# Patient Record
Sex: Female | Born: 1945
Health system: Southern US, Community
[De-identification: ages and names within clinical notes are randomized; demographics above are authoritative.]

## PROBLEM LIST (undated history)

## (undated) DIAGNOSIS — I10 Essential (primary) hypertension: Secondary | ICD-10-CM

## (undated) DIAGNOSIS — E785 Hyperlipidemia, unspecified: Secondary | ICD-10-CM

## (undated) DIAGNOSIS — F329 Major depressive disorder, single episode, unspecified: Secondary | ICD-10-CM

## (undated) DIAGNOSIS — F419 Anxiety disorder, unspecified: Secondary | ICD-10-CM

## (undated) DIAGNOSIS — F32A Depression, unspecified: Secondary | ICD-10-CM

## (undated) HISTORY — PX: WISDOM TOOTH EXTRACTION: SHX21

## (undated) HISTORY — DX: Depression, unspecified: F32.A

## (undated) HISTORY — DX: Essential (primary) hypertension: I10

## (undated) HISTORY — DX: Major depressive disorder, single episode, unspecified: F32.9

## (undated) HISTORY — DX: Anxiety disorder, unspecified: F41.9

## (undated) HISTORY — PX: DENTAL SURGERY: SHX609

## (undated) HISTORY — DX: Hyperlipidemia, unspecified: E78.5

---

## 1997-09-26 ENCOUNTER — Other Ambulatory Visit: Admission: RE | Admit: 1997-09-26 | Discharge: 1997-09-26 | Payer: Self-pay | Admitting: Obstetrics and Gynecology

## 1998-10-08 ENCOUNTER — Other Ambulatory Visit: Admission: RE | Admit: 1998-10-08 | Discharge: 1998-10-08 | Payer: Self-pay | Admitting: Obstetrics and Gynecology

## 1999-12-30 ENCOUNTER — Other Ambulatory Visit: Admission: RE | Admit: 1999-12-30 | Discharge: 1999-12-30 | Payer: Self-pay | Admitting: Obstetrics and Gynecology

## 2001-01-15 ENCOUNTER — Other Ambulatory Visit: Admission: RE | Admit: 2001-01-15 | Discharge: 2001-01-15 | Payer: Self-pay | Admitting: Obstetrics and Gynecology

## 2002-01-18 ENCOUNTER — Other Ambulatory Visit: Admission: RE | Admit: 2002-01-18 | Discharge: 2002-01-18 | Payer: Self-pay | Admitting: Obstetrics and Gynecology

## 2003-01-30 ENCOUNTER — Other Ambulatory Visit: Admission: RE | Admit: 2003-01-30 | Discharge: 2003-01-30 | Payer: Self-pay | Admitting: Obstetrics and Gynecology

## 2004-01-09 ENCOUNTER — Ambulatory Visit: Payer: Self-pay | Admitting: Gastroenterology

## 2004-02-10 ENCOUNTER — Other Ambulatory Visit: Admission: RE | Admit: 2004-02-10 | Discharge: 2004-02-10 | Payer: Self-pay | Admitting: Obstetrics and Gynecology

## 2005-02-11 ENCOUNTER — Other Ambulatory Visit: Admission: RE | Admit: 2005-02-11 | Discharge: 2005-02-11 | Payer: Self-pay | Admitting: Obstetrics and Gynecology

## 2006-03-02 ENCOUNTER — Other Ambulatory Visit: Admission: RE | Admit: 2006-03-02 | Discharge: 2006-03-02 | Payer: Self-pay | Admitting: Obstetrics & Gynecology

## 2007-05-04 ENCOUNTER — Other Ambulatory Visit: Admission: RE | Admit: 2007-05-04 | Discharge: 2007-05-04 | Payer: Self-pay | Admitting: Obstetrics and Gynecology

## 2012-04-27 ENCOUNTER — Ambulatory Visit: Payer: BC Managed Care – PPO

## 2012-04-27 ENCOUNTER — Ambulatory Visit (INDEPENDENT_AMBULATORY_CARE_PROVIDER_SITE_OTHER): Payer: BC Managed Care – PPO | Admitting: Internal Medicine

## 2012-04-27 VITALS — BP 146/79 | HR 72 | Temp 100.0°F | Resp 16 | Ht 64.75 in | Wt 195.2 lb

## 2012-04-27 DIAGNOSIS — R05 Cough: Secondary | ICD-10-CM

## 2012-04-27 DIAGNOSIS — R509 Fever, unspecified: Secondary | ICD-10-CM

## 2012-04-27 DIAGNOSIS — R059 Cough, unspecified: Secondary | ICD-10-CM

## 2012-04-27 DIAGNOSIS — N390 Urinary tract infection, site not specified: Secondary | ICD-10-CM

## 2012-04-27 DIAGNOSIS — E785 Hyperlipidemia, unspecified: Secondary | ICD-10-CM

## 2012-04-27 DIAGNOSIS — I10 Essential (primary) hypertension: Secondary | ICD-10-CM | POA: Insufficient documentation

## 2012-04-27 DIAGNOSIS — R3915 Urgency of urination: Secondary | ICD-10-CM

## 2012-04-27 LAB — POCT URINALYSIS DIPSTICK
Bilirubin, UA: NEGATIVE
Glucose, UA: NEGATIVE
Ketones, UA: NEGATIVE
Nitrite, UA: NEGATIVE
Protein, UA: NEGATIVE
Spec Grav, UA: 1.015
Urobilinogen, UA: 0.2
pH, UA: 7

## 2012-04-27 LAB — POCT CBC
Granulocyte percent: 76.8 %G (ref 37–80)
HCT, POC: 41.8 % (ref 37.7–47.9)
Hemoglobin: 13.4 g/dL (ref 12.2–16.2)
Lymph, poc: 1.3 (ref 0.6–3.4)
MCH, POC: 30.3 pg (ref 27–31.2)
MCHC: 32.1 g/dL (ref 31.8–35.4)
MCV: 93.5 fL (ref 80–97)
MID (cbc): 0.5 (ref 0–0.9)
MPV: 9.9 fL (ref 0–99.8)
POC Granulocyte: 6.1 (ref 2–6.9)
POC LYMPH PERCENT: 16.7 %L (ref 10–50)
POC MID %: 6.5 %M (ref 0–12)
Platelet Count, POC: 208 10*3/uL (ref 142–424)
RBC: 4.47 M/uL (ref 4.04–5.48)
RDW, POC: 12.8 %
WBC: 7.9 10*3/uL (ref 4.6–10.2)

## 2012-04-27 LAB — POCT UA - MICROSCOPIC ONLY
Casts, Ur, LPF, POC: NEGATIVE
Crystals, Ur, HPF, POC: NEGATIVE
Mucus, UA: NEGATIVE
Yeast, UA: NEGATIVE

## 2012-04-27 MED ORDER — HYDROCODONE-HOMATROPINE 5-1.5 MG/5ML PO SYRP: 5.0000 mL | ORAL_SOLUTION | Freq: Four times a day (QID) | ORAL | Status: DC | PRN

## 2012-04-27 MED ORDER — CIPROFLOXACIN HCL 250 MG PO TABS: 250.0000 mg | ORAL_TABLET | Freq: Two times a day (BID) | ORAL | Status: DC

## 2012-04-27 NOTE — Addendum Note (Signed)
Addended by: Tonye Pearson on: 04/27/2012 09:00 AM   Modules accepted: Orders

## 2012-04-27 NOTE — Progress Notes (Signed)
Subjective:    Patient ID: Cindy [name], female    DOB: [birth year], 67 y.o.   MRN: [id number]  HPI complaining of urinary urgency, frequency, and small void for the past 5-6 days Over the past 2 weeks has had congestion with fever and cough and fatigue and myalgias. Seems to get well but relapsed again. History of occasional urinary tract infections without pyelonephritis  Past medical history hypertension and hyperlipidemia/elevated BMI    Review of Systems No chills or night sweats No chest pain No wheezing or shortness of breath No abdominal pain or flank pain    Objective:   Physical Exam Vital signs blood pressure 146/79 temperature 100 Elevated BMI TMs clear/nares clear Throat clear/no nodes Chest with rales at the left base posteriorly Heart regular without murmur Abdomen supple No CVA tenderness =   [facility] reading (PRIMARY) by  Dr.Marjorie Lussier=no active infiltrate Results for orders placed in visit on 04/27/12  POCT UA - MICROSCOPIC ONLY      Result Value Range   WBC, Ur, HPF, POC 4-8     RBC, urine, microscopic 3-8     Bacteria, U Microscopic trace     Mucus, UA neg     Epithelial cells, urine per micros 0-2     Crystals, Ur, HPF, POC neg     Casts, Ur, LPF, POC neg     Yeast, UA neg    POCT URINALYSIS DIPSTICK      Result Value Range   Color, UA yellow     Clarity, UA sl cloudy     Glucose, UA neg     Bilirubin, UA neg     Ketones, UA neg     Spec Grav, UA 1.015     Blood, UA small     pH, UA 7.0     Protein, UA neg     Urobilinogen, UA 0.2     Nitrite, UA neg     Leukocytes, UA moderate (2+)    POCT CBC      Result Value Range   WBC 7.9  4.6 - 10.2 K/uL   Lymph, poc 1.3  0.6 - 3.4   POC LYMPH PERCENT 16.7  10 - 50 %L   MID (cbc) 0.5  0 - 0.9   POC MID % 6.5  0 - 12 %M   POC Granulocyte 6.1  2 - 6.9   Granulocyte percent 76.8  37 - 80 %G   RBC 4.47  4.04 - 5.48 M/uL   Hemoglobin 13.4  12.2 - 16.2 g/dL   HCT, POC 78.2  95.6 - 47.9 %   MCV  93.5  80 - 97 fL   MCH, POC 30.3  27 - 31.2 pg   MCHC 32.1  31.8 - 35.4 g/dL   RDW, POC 21.3     Platelet Count, POC 208  142 - 424 K/uL   MPV 9.9  0 - 99.8 fL        Assessment & Plan:  Urinary urgency - Plan: POCT UA - Microscopic Only, POCT urinalysis dipstick  Cough - Plan: DG Chest 2 View-No infiltrate/probable viral pneumonia now resolving  Fever, unspecified - Plan: POCT CBC, DG Chest 2 View  Urinary tract infection, site not specified - Plan: Urine culture  Meds ordered this encounter  Medications  . ciprofloxacin (CIPRO) 250 MG tablet    Sig: Take 1 tablet (250 mg total) by mouth 2 (two) times daily.    Dispense:  20 tablet  Refill:  0

## 2012-04-29 LAB — URINE CULTURE: Colony Count: >=100000

## 2013-08-01 ENCOUNTER — Ambulatory Visit (INDEPENDENT_AMBULATORY_CARE_PROVIDER_SITE_OTHER): Payer: BC Managed Care – PPO | Admitting: Nurse Practitioner

## 2013-08-01 ENCOUNTER — Encounter: Payer: Self-pay | Admitting: Nurse Practitioner

## 2013-08-01 VITALS — BP 122/76 | HR 64 | Ht 64.5 in | Wt 198.0 lb

## 2013-08-01 DIAGNOSIS — Z0000 Encounter for general adult medical examination without abnormal findings: Secondary | ICD-10-CM

## 2013-08-01 DIAGNOSIS — Z01419 Encounter for gynecological examination (general) (routine) without abnormal findings: Secondary | ICD-10-CM

## 2013-08-01 DIAGNOSIS — Q512 Other doubling of uterus, unspecified: Secondary | ICD-10-CM

## 2013-08-01 DIAGNOSIS — Q5128 Other doubling of uterus, other specified: Secondary | ICD-10-CM

## 2013-08-01 DIAGNOSIS — R350 Frequency of micturition: Secondary | ICD-10-CM

## 2013-08-01 LAB — POCT URINALYSIS DIPSTICK
Bilirubin, UA: NEGATIVE
Blood, UA: NEGATIVE
Glucose, UA: NEGATIVE
Ketones, UA: NEGATIVE
Nitrite, UA: NEGATIVE
Protein, UA: NEGATIVE
Urobilinogen, UA: NEGATIVE
pH, UA: 6

## 2013-08-01 LAB — HEMOGLOBIN, FINGERSTICK: Hemoglobin, fingerstick: 13.1 g/dL (ref 12.0–16.0)

## 2013-08-01 NOTE — Progress Notes (Addendum)
Patient ID: Cindy [name], female   DOB: [birth year], 68 y.o.   MRN: [id number] 69 y.o. G0P0 Married Caucasian Fe here for NGYN annual exam. She is an established patient over 3 years ago.  In the interim her father was ill and then passed.   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 No new diagnosis.  Other health problems of Tinnitus and hearing loss has gotten worse over the years.  She has not been SA in 30 years.  o UTI symptoms.  Patient's last menstrual period was 09/29/2002.          Sexually active: no  The current method of family planning is abstinence and post menopausal status.    Exercising: no  The patient does not participate in regular exercise at present. Smoker:  no  Health Maintenance: Pap:  2011, normal (history of double cervix) MMG:  2015, normal Colonoscopy:  2014, polyps, repeat in 3-5 years  BMD:   2014, normal TDaP:  UTD Labs: HB:  13.1  Urine:  Trace leuk's   reports that she has quit smoking. She has never used smokeless tobacco. She reports that she drinks about 5 ounces of alcohol per week. She reports that she does not use illicit drugs.  Past Medical History  Diagnosis Date  . Depression   . Anxiety   . Hypertension   . Hyperlipidemia     Past Surgical History  Procedure Laterality Date  . Dental surgery  2012 - 2013    implants  . Wisdom tooth extraction  20    Current Outpatient Prescriptions  Medication Sig Dispense Refill  . ALPRAZolam (XANAX) 0.25 MG tablet Take 0.25 mg by mouth as needed.       Marland Kitchen aspirin 81 MG tablet Take 81 mg by mouth daily.      . Calcium Carbonate-Vitamin D (CALCIUM 500 + D PO) Take 2 tablets by mouth daily.      . cholecalciferol (VITAMIN D) 1000 UNITS tablet Take 1,000 Units by mouth daily.      . hydrochlorothiazide (HYDRODIURIL) 25 MG tablet Take 25 mg by mouth daily.      . metoprolol succinate (TOPROL-XL) 100 MG 24 hr tablet Take 100 mg by mouth daily. Take with or immediately following a meal.      .  Multiple Vitamin (MULTIVITAMIN) tablet Take 1 tablet by mouth daily.      . Omega-3 Fatty Acids (FISH OIL) 1000 MG CAPS Take 1 capsule by mouth daily.      Marland Kitchen omeprazole (PRILOSEC) 20 MG capsule Take 20 mg by mouth daily.      . simvastatin (ZOCOR) 10 MG tablet Take 10 mg by mouth at bedtime.      . vitamin E 400 UNIT capsule Take 400 Units by mouth daily.       No current facility-administered medications for this visit.    Family History  Problem Relation Age of Onset  . Dementia Mother   . Cirrhosis Brother     ROS:  Pertinent items are noted in HPI.  Otherwise, a comprehensive ROS was negative.  Exam:   BP 122/76  Pulse 64  Ht 5' 4.5" (1.638 m)  Wt 198 lb (89.812 kg)  BMI 33.47 kg/m2  LMP 09/29/2002 Height: 5' 4.5" (163.8 cm)  Ht Readings from Last 3 Encounters:  08/01/13 5' 4.5" (1.638 m)  04/27/12 5' 4.75" (1.645 m)    General appearance: alert, cooperative and appears stated age Head: Normocephalic, without obvious abnormality, atraumatic  Neck: no adenopathy, supple, symmetrical, trachea midline and thyroid normal to inspection and palpation Lungs: clear to auscultation bilaterally Breasts: normal appearance, no masses or tenderness Heart: regular rate and rhythm Abdomen: soft, non-tender; no masses,  no organomegaly Extremities: extremities normal, atraumatic, no cyanosis or edema Skin: Skin color, texture, turgor normal. No rashes or lesions Lymph nodes: Cervical, supraclavicular, and axillary nodes normal. No abnormal inguinal nodes palpated Neurologic: Grossly normal   Pelvic: External genitalia:  no lesions              Urethra:  normal appearing urethra with no masses, tenderness or lesions              Bartholin's and Skene's: normal                 Vagina: atrophic appearing vagina with normal color and discharge, no lesions              Cervix: anteverted, history of double cervix - bottom right is the largest and atrophic changes, the left is stenotic  and harder to get into cervical os.               Pap taken: yes Bimanual Exam:  Uterus:  normal size, contour, position, consistency, mobility, non-tender              Adnexa: no mass, fullness, tenderness               Rectovaginal: Confirms               Anus:  normal sphincter tone, no lesions  A:  Well Woman with normal exam  Postmenopausal - on HRT from 03/1999 - 03/2007  Hyperlipidemia  Hypertension  R/O UTI  P:   Reviewed health and wellness pertinent to exam  Pap smear taken today X 2  Mammogram due 2016  Follow with urine culture  Counseled on breast self exam, mammography screening, menopause, adequate intake of calcium and vitamin D, diet and exercise, Kegel's exercises return annually or prn  An After Visit Summary was printed and given to the patient.

## 2013-08-01 NOTE — Patient Instructions (Signed)

## 2013-08-02 ENCOUNTER — Encounter: Payer: Self-pay | Admitting: Nurse Practitioner

## 2013-08-02 ENCOUNTER — Other Ambulatory Visit: Payer: Self-pay | Admitting: Nurse Practitioner

## 2013-08-02 LAB — URINALYSIS, MICROSCOPIC ONLY
Casts: NONE SEEN
Crystals: NONE SEEN
Squamous Epithelial / LPF: NONE SEEN

## 2013-08-02 MED ORDER — ESTRADIOL 0.1 MG/GM VA CREA: TOPICAL_CREAM | VAGINAL | Status: DC

## 2013-08-02 NOTE — Progress Notes (Signed)
Encounter reviewed by Dr. Savva Beamer Silva.  

## 2013-08-03 ENCOUNTER — Encounter: Payer: Self-pay | Admitting: Nurse Practitioner

## 2013-08-03 LAB — URINE CULTURE: Colony Count: >=100000

## 2013-08-04 ENCOUNTER — Other Ambulatory Visit: Payer: Self-pay | Admitting: Nurse Practitioner

## 2013-08-04 MED ORDER — NITROFURANTOIN MONOHYD MACRO 100 MG PO CAPS: 100.0000 mg | ORAL_CAPSULE | Freq: Two times a day (BID) | ORAL | Status: DC

## 2013-08-05 LAB — IPS PAP TEST WITH HPV

## 2013-08-29 ENCOUNTER — Encounter: Payer: Self-pay | Admitting: Nurse Practitioner

## 2013-08-29 ENCOUNTER — Ambulatory Visit (INDEPENDENT_AMBULATORY_CARE_PROVIDER_SITE_OTHER): Payer: 59 | Admitting: Nurse Practitioner

## 2013-08-29 VITALS — BP 132/70 | HR 68 | Temp 97.9°F | Wt 200.0 lb

## 2013-08-29 DIAGNOSIS — N390 Urinary tract infection, site not specified: Secondary | ICD-10-CM

## 2013-08-29 LAB — POCT URINALYSIS DIPSTICK
Bilirubin, UA: NEGATIVE
Glucose, UA: NEGATIVE
Ketones, UA: NEGATIVE
Nitrite, UA: NEGATIVE
Protein, UA: NEGATIVE
Urobilinogen, UA: NEGATIVE
pH, UA: 6

## 2013-08-29 MED ORDER — CIPROFLOXACIN HCL 500 MG PO TABS: 500.0000 mg | ORAL_TABLET | Freq: Two times a day (BID) | ORAL | Status: DC

## 2013-08-29 NOTE — Patient Instructions (Signed)

## 2013-08-29 NOTE — Progress Notes (Signed)
Subjective:     Patient ID: Cindy [name], female   DOB: 1945-07-29, 68 y.o.   MRN: [id number]  Urinary Tract Infection  Pertinent negatives include no chills, flank pain, frequency, nausea or vomiting.    This 68 yo WM Fe presents with symptoms of UTI of nocturia for 2 days. This am had an increase in pressure lower abdomen and decrease in flow.  No fever/ chills.  Some diarrhea today.  No other symptoms.  Recently took [place] on 6/4 for E-Coli UTI.  There was no TOC after the med's.  She [place] unsure if she ever got over the last infection.   Review of Systems  Constitutional: Negative for fever, chills and fatigue.  Gastrointestinal: Positive for diarrhea and abdominal distention. Negative for nausea, vomiting, abdominal pain, constipation and anal bleeding.  Genitourinary: Positive for decreased urine volume. Negative for frequency, flank pain and dyspareunia.  Musculoskeletal: Negative for arthralgias, back pain and joint swelling.  Neurological: Negative.   Psychiatric/Behavioral: Negative.        Objective:   Physical Exam  Constitutional: She is oriented to person, place, and time. She appears well-developed and well-nourished. No distress.  Abdominal: Soft. She exhibits no distension. There is no tenderness. There is no rebound and no guarding.  Genitourinary:  No vaginal discharge. Some tender over the bladder. Urine trace RBC, moderate Leuk's  Neurological: She is alert and oriented to person, place, and time.  Psychiatric: She has a normal mood and affect. Her behavior is normal. Judgment and thought content normal.       Assessment:     UTI - recurrent vs. New infection.    Plan:     Change this time to Cipro 500 mg BID and follow urine culture Increase po fluids

## 2013-08-30 LAB — URINALYSIS, MICROSCOPIC ONLY
Bacteria, UA: NONE SEEN
Casts: NONE SEEN
Crystals: NONE SEEN
Squamous Epithelial / LPF: NONE SEEN
WBC, UA: >50 WBC/hpf — AB (ref <3)

## 2013-09-01 LAB — URINE CULTURE: Colony Count: >=100000

## 2013-09-03 NOTE — Progress Notes (Signed)
Encounter reviewed by Dr. Shawn Carattini Silva.  

## 2014-08-05 ENCOUNTER — Ambulatory Visit: Payer: BC Managed Care – PPO | Admitting: Nurse Practitioner

## 2014-08-25 ENCOUNTER — Other Ambulatory Visit: Payer: Self-pay

## 2019-03-29 ENCOUNTER — Ambulatory Visit: Payer: Self-pay

## 2019-04-06 ENCOUNTER — Ambulatory Visit: Payer: Self-pay

## 2019-04-15 ENCOUNTER — Ambulatory Visit: Payer: Self-pay

## 2019-11-26 DIAGNOSIS — R05 Cough: Secondary | ICD-10-CM | POA: Diagnosis not present

## 2019-11-26 DIAGNOSIS — J40 Bronchitis, not specified as acute or chronic: Secondary | ICD-10-CM | POA: Diagnosis not present

## 2019-11-26 DIAGNOSIS — R0609 Other forms of dyspnea: Secondary | ICD-10-CM | POA: Diagnosis not present

## 2019-12-12 ENCOUNTER — Ambulatory Visit
Admission: RE | Admit: 2019-12-12 | Discharge: 2019-12-12 | Disposition: A | Discharge status: Home / Self Care | Payer: Medicare PPO | Source: Ambulatory Visit | Attending: Physician Assistant | Admitting: Physician Assistant

## 2019-12-12 ENCOUNTER — Other Ambulatory Visit: Payer: Self-pay | Admitting: Physician Assistant

## 2019-12-12 DIAGNOSIS — R0602 Shortness of breath: Secondary | ICD-10-CM

## 2019-12-12 DIAGNOSIS — R059 Cough, unspecified: Secondary | ICD-10-CM

## 2019-12-12 DIAGNOSIS — R5383 Other fatigue: Secondary | ICD-10-CM | POA: Diagnosis not present

## 2019-12-16 ENCOUNTER — Other Ambulatory Visit: Payer: Self-pay | Admitting: Family Medicine

## 2019-12-16 DIAGNOSIS — R918 Other nonspecific abnormal finding of lung field: Secondary | ICD-10-CM

## 2019-12-17 DIAGNOSIS — J90 Pleural effusion, not elsewhere classified: Secondary | ICD-10-CM | POA: Diagnosis not present

## 2019-12-17 DIAGNOSIS — R5383 Other fatigue: Secondary | ICD-10-CM | POA: Diagnosis not present

## 2019-12-17 DIAGNOSIS — R0602 Shortness of breath: Secondary | ICD-10-CM | POA: Diagnosis not present

## 2019-12-18 ENCOUNTER — Other Ambulatory Visit (HOSPITAL_COMMUNITY): Payer: Self-pay | Admitting: Family Medicine

## 2019-12-18 ENCOUNTER — Ambulatory Visit (HOSPITAL_BASED_OUTPATIENT_CLINIC_OR_DEPARTMENT_OTHER)
Admission: RE | Admit: 2019-12-18 | Discharge: 2019-12-18 | Disposition: A | Discharge status: Home / Self Care | Payer: Medicare PPO | Source: Ambulatory Visit | Attending: Family Medicine | Admitting: Family Medicine

## 2019-12-18 ENCOUNTER — Encounter (HOSPITAL_BASED_OUTPATIENT_CLINIC_OR_DEPARTMENT_OTHER): Payer: Self-pay

## 2019-12-18 ENCOUNTER — Other Ambulatory Visit: Payer: Self-pay | Admitting: Family Medicine

## 2019-12-18 ENCOUNTER — Other Ambulatory Visit: Payer: Self-pay

## 2019-12-18 DIAGNOSIS — I700 Atherosclerosis of aorta: Secondary | ICD-10-CM | POA: Diagnosis not present

## 2019-12-18 DIAGNOSIS — J90 Pleural effusion, not elsewhere classified: Secondary | ICD-10-CM

## 2019-12-18 DIAGNOSIS — R0600 Dyspnea, unspecified: Secondary | ICD-10-CM | POA: Diagnosis not present

## 2019-12-18 DIAGNOSIS — J9819 Other pulmonary collapse: Secondary | ICD-10-CM | POA: Diagnosis not present

## 2019-12-18 DIAGNOSIS — R918 Other nonspecific abnormal finding of lung field: Secondary | ICD-10-CM | POA: Diagnosis not present

## 2019-12-18 MED ORDER — IOHEXOL 300 MG/ML  SOLN
100.0000 mL | Freq: Once | INTRAMUSCULAR | Status: AC | PRN
  Administered 2019-12-18: 75 mL via INTRAVENOUS

## 2019-12-19 ENCOUNTER — Other Ambulatory Visit (HOSPITAL_COMMUNITY): Payer: Self-pay | Admitting: Radiology

## 2019-12-19 ENCOUNTER — Ambulatory Visit (HOSPITAL_COMMUNITY)
Admission: RE | Admit: 2019-12-19 | Discharge: 2019-12-19 | Disposition: A | Discharge status: Home / Self Care | Payer: Medicare PPO | Source: Ambulatory Visit | Attending: Radiology | Admitting: Radiology

## 2019-12-19 ENCOUNTER — Ambulatory Visit (HOSPITAL_COMMUNITY)
Admission: RE | Admit: 2019-12-19 | Discharge: 2019-12-19 | Disposition: A | Discharge status: Home / Self Care | Payer: Medicare PPO | Source: Ambulatory Visit | Attending: Family Medicine | Admitting: Family Medicine

## 2019-12-19 DIAGNOSIS — I700 Atherosclerosis of aorta: Secondary | ICD-10-CM | POA: Insufficient documentation

## 2019-12-19 DIAGNOSIS — J90 Pleural effusion, not elsewhere classified: Secondary | ICD-10-CM | POA: Diagnosis not present

## 2019-12-19 DIAGNOSIS — Z1152 Encounter for screening for COVID-19: Secondary | ICD-10-CM | POA: Diagnosis not present

## 2019-12-19 DIAGNOSIS — J9811 Atelectasis: Secondary | ICD-10-CM | POA: Diagnosis not present

## 2019-12-19 DIAGNOSIS — R059 Cough, unspecified: Secondary | ICD-10-CM | POA: Diagnosis not present

## 2019-12-19 HISTORY — PX: IR THORACENTESIS ASP PLEURAL SPACE W/IMG GUIDE: IMG5380

## 2019-12-19 LAB — BODY FLUID CELL COUNT WITH DIFFERENTIAL
Eos, Fluid: 1 %
Lymphs, Fluid: 86 %
Monocyte-Macrophage-Serous Fluid: 7 % — ABNORMAL LOW (ref 50–90)
Neutrophil Count, Fluid: 6 % (ref 0–25)
Total Nucleated Cell Count, Fluid: 3140 cu mm — ABNORMAL HIGH (ref 0–1000)

## 2019-12-19 MED ORDER — LIDOCAINE HCL 1 % IJ SOLN
INTRAMUSCULAR | Status: AC
  Filled 2019-12-19: qty 20

## 2019-12-19 MED ORDER — LIDOCAINE HCL 1 % IJ SOLN
INTRAMUSCULAR | Status: DC | PRN
  Administered 2019-12-19: 10 mL

## 2019-12-19 NOTE — Procedures (Signed)
.  PROCEDURE SUMMARY:  Successful US guided left thoracentesis. Yielded 1.5 L of clear yellow fluid. Pt tolerated procedure well. No immediate complications.  Specimen was sent for labs. CXR ordered.  EBL < 5 mL  Ascencion Dike PA-C 12/19/2019 11:36 AM

## 2019-12-20 ENCOUNTER — Other Ambulatory Visit: Payer: Self-pay

## 2019-12-20 ENCOUNTER — Other Ambulatory Visit (HOSPITAL_COMMUNITY)
Admission: RE | Admit: 2019-12-20 | Discharge: 2019-12-20 | Disposition: A | Discharge status: Home / Self Care | Payer: Medicare PPO | Source: Ambulatory Visit

## 2019-12-20 DIAGNOSIS — J948 Other specified pleural conditions: Secondary | ICD-10-CM | POA: Diagnosis not present

## 2019-12-20 LAB — GLUCOSE, PLEURAL OR PERITONEAL FLUID: Glucose, Fluid: 109 mg/dL

## 2019-12-20 LAB — PATHOLOGIST SMEAR REVIEW

## 2019-12-20 LAB — LACTATE DEHYDROGENASE, PLEURAL OR PERITONEAL FLUID: LD, Fluid: 139 U/L — ABNORMAL HIGH (ref 3–23)

## 2019-12-23 LAB — BODY FLUID CULTURE: Culture: NO GROWTH

## 2019-12-24 ENCOUNTER — Ambulatory Visit
Admission: RE | Admit: 2019-12-24 | Discharge: 2019-12-24 | Disposition: A | Discharge status: Home / Self Care | Payer: Medicare PPO | Source: Ambulatory Visit | Attending: Family Medicine | Admitting: Family Medicine

## 2019-12-24 ENCOUNTER — Other Ambulatory Visit: Payer: Self-pay | Admitting: Family Medicine

## 2019-12-24 DIAGNOSIS — J90 Pleural effusion, not elsewhere classified: Secondary | ICD-10-CM

## 2019-12-24 DIAGNOSIS — J9811 Atelectasis: Secondary | ICD-10-CM | POA: Diagnosis not present

## 2019-12-24 LAB — PH, BODY FLUID: pH, Body Fluid: 7.8

## 2019-12-31 ENCOUNTER — Other Ambulatory Visit: Payer: Medicare PPO

## 2020-01-02 ENCOUNTER — Other Ambulatory Visit: Payer: Self-pay

## 2020-01-02 ENCOUNTER — Other Ambulatory Visit (HOSPITAL_COMMUNITY)
Admission: RE | Admit: 2020-01-02 | Discharge: 2020-01-02 | Disposition: A | Discharge status: Home / Self Care | Payer: Medicare PPO | Source: Ambulatory Visit | Attending: Pulmonary Disease | Admitting: Pulmonary Disease

## 2020-01-02 ENCOUNTER — Ambulatory Visit (INDEPENDENT_AMBULATORY_CARE_PROVIDER_SITE_OTHER)
Admission: RE | Admit: 2020-01-02 | Discharge: 2020-01-02 | Disposition: A | Discharge status: Home / Self Care | Payer: Medicare PPO | Source: Ambulatory Visit | Attending: Pulmonary Disease | Admitting: Pulmonary Disease

## 2020-01-02 ENCOUNTER — Ambulatory Visit: Payer: Medicare PPO | Admitting: Pulmonary Disease

## 2020-01-02 ENCOUNTER — Ambulatory Visit (INDEPENDENT_AMBULATORY_CARE_PROVIDER_SITE_OTHER): Payer: Medicare PPO

## 2020-01-02 ENCOUNTER — Encounter: Payer: Self-pay | Admitting: Pulmonary Disease

## 2020-01-02 ENCOUNTER — Telehealth: Payer: Self-pay | Admitting: Pulmonary Disease

## 2020-01-02 VITALS — BP 128/80 | HR 88 | Temp 97.2°F | Ht 64.0 in | Wt 197.5 lb

## 2020-01-02 DIAGNOSIS — C801 Malignant (primary) neoplasm, unspecified: Secondary | ICD-10-CM | POA: Diagnosis not present

## 2020-01-02 DIAGNOSIS — J90 Pleural effusion, not elsewhere classified: Secondary | ICD-10-CM

## 2020-01-02 DIAGNOSIS — J910 Malignant pleural effusion: Secondary | ICD-10-CM | POA: Diagnosis not present

## 2020-01-02 DIAGNOSIS — R222 Localized swelling, mass and lump, trunk: Secondary | ICD-10-CM | POA: Insufficient documentation

## 2020-01-02 DIAGNOSIS — J948 Other specified pleural conditions: Secondary | ICD-10-CM | POA: Diagnosis not present

## 2020-01-02 DIAGNOSIS — R059 Cough, unspecified: Secondary | ICD-10-CM | POA: Diagnosis not present

## 2020-01-02 LAB — BODY FLUID CELL COUNT WITH DIFFERENTIAL
Eos, Fluid: 1 %
Lymphs, Fluid: 91 %
Monocyte-Macrophage-Serous Fluid: 5 % — ABNORMAL LOW (ref 50–90)
Neutrophil Count, Fluid: 3 % (ref 0–25)
Total Nucleated Cell Count, Fluid: 2135 cu mm — ABNORMAL HIGH (ref 0–1000)

## 2020-01-02 LAB — GLUCOSE, PLEURAL OR PERITONEAL FLUID: Glucose, Fluid: 103 mg/dL

## 2020-01-02 LAB — ALBUMIN, PLEURAL OR PERITONEAL FLUID: Albumin, Fluid: 3.2 g/dL

## 2020-01-02 LAB — CYTOLOGY - NON PAP

## 2020-01-02 LAB — LACTATE DEHYDROGENASE, PLEURAL OR PERITONEAL FLUID: LD, Fluid: 149 U/L — ABNORMAL HIGH (ref 3–23)

## 2020-01-02 NOTE — Progress Notes (Signed)
Synopsis: Referred in November 2021 for pleural effusion by [name], MD  Subjective:   PATIENT ID: Cindy [name] GENDER: female DOB: [birth year], MRN: [id number]  Chief Complaint  Patient presents with  . Consult    here for consult for plueral effusion---had 2 liters drained about 2 weeks ago.  she is starting to feel [facility] again and ct showed the fluid is back     This is a 74 year old female past medical history of anxiety, hyperlipidemia, hypertension, former smoker, 15+ years, 1ppd.  Patient referred for evaluation of pleural effusion.  Patient underwent CT scan of the chest on 12/18/2019 which revealed progressive enlargement of a left-sided pleural effusion.  She also had indeterminate pulmonary nodules within the right lung 5 mm in size. History of skin cancers, grandmother with lung cancer   Past Medical History:  Diagnosis Date  . Anxiety   . Depression   . Hyperlipidemia   . Hypertension      Family History  Problem Relation Age of Onset  . Dementia Mother   . COPD Mother   . Lung cancer Maternal Grandmother   . Cirrhosis Brother      Past Surgical History:  Procedure Laterality Date  . DENTAL SURGERY  2012 - 2013   implants  . IR THORACENTESIS ASP PLEURAL SPACE W/IMG GUIDE  12/19/2019  . WISDOM TOOTH EXTRACTION  20    Social History   Socioeconomic History  . Marital status: Widowed    Spouse name: Not on file  . Number of children: 0  . Years of education: Not on file  . Highest education level: Not on file  Occupational History    Employer: [employer]  Tobacco Use  . Smoking status: Former Research scientist (life sciences)  . Smokeless tobacco: Never Used  Substance and Sexual Activity  . Alcohol use: Yes    Alcohol/week: 10.0 standard drinks    Types: 10 drink(s) per week  . Drug use: No  . Sexual activity: Never    Birth control/protection: Abstinence, Post-menopausal  Other Topics Concern  . Not on file  Social History Narrative  . Not on file    Social Determinants of Health   Financial Resource Strain:   . Difficulty of Paying Living Expenses: Not on file  Food Insecurity:   . Worried About Charity fundraiser in the Last Year: Not on file  . Ran Out of Food in the Last Year: Not on file  Transportation Needs:   . Lack of Transportation (Medical): Not on file  . Lack of Transportation (Non-Medical): Not on file  Physical Activity:   . Days of Exercise per Week: Not on file  . Minutes of Exercise per Session: Not on file  Stress:   . Feeling of Stress : Not on file  Social Connections:   . Frequency of Communication with Friends and Family: Not on file  . Frequency of Social Gatherings with Friends and Family: Not on file  . Attends Religious Services: Not on file  . Active Member of Clubs or Organizations: Not on file  . Attends Archivist Meetings: Not on file  . Marital Status: Not on file  Intimate Partner Violence:   . Fear of Current or Ex-Partner: Not on file  . Emotionally Abused: Not on file  . Physically Abused: Not on file  . Sexually Abused: Not on file     No Known Allergies   Outpatient Medications Prior to Visit  Medication Sig Dispense Refill  .  ALPRAZolam (XANAX) 0.25 MG tablet Take 0.25 mg by mouth as needed.     Marland Kitchen aspirin 81 MG tablet Take 81 mg by mouth daily.    . Calcium Carbonate-Vitamin D (CALCIUM 500 + D PO) Take 2 tablets by mouth daily.    . cholecalciferol (VITAMIN D) 1000 UNITS tablet Take 1,000 Units by mouth daily.    . hydrochlorothiazide (HYDRODIURIL) 25 MG tablet Take 25 mg by mouth daily.    . metoprolol succinate (TOPROL-XL) 100 MG 24 hr tablet Take 100 mg by mouth daily. Take with or immediately following a meal.    . Multiple Vitamin (MULTIVITAMIN) tablet Take 1 tablet by mouth daily.    . simvastatin (ZOCOR) 10 MG tablet Take 10 mg by mouth at bedtime.    . vitamin E 400 UNIT capsule Take 400 Units by mouth daily.    . ciprofloxacin (CIPRO) 500 MG tablet Take 1  tablet (500 mg total) by mouth 2 (two) times daily. 14 tablet 0  . Omega-3 Fatty Acids (FISH OIL) 1000 MG CAPS Take 1 capsule by mouth daily.    Marland Kitchen omeprazole (PRILOSEC) 20 MG capsule Take 20 mg by mouth daily.     No facility-administered medications prior to visit.    Review of Systems  Constitutional: Negative for chills, fever, malaise/fatigue and weight loss.  HENT: Negative for hearing loss, sore throat and tinnitus.   Eyes: Negative for blurred vision and double vision.  Respiratory: Positive for shortness of breath. Negative for cough, hemoptysis, sputum production, wheezing and stridor.   Cardiovascular: Negative for chest pain, palpitations, orthopnea, leg swelling and PND.  Gastrointestinal: Negative for abdominal pain, constipation, diarrhea, heartburn, nausea and vomiting.  Genitourinary: Negative for dysuria, hematuria and urgency.  Musculoskeletal: Negative for joint pain and myalgias.  Skin: Negative for itching and rash.  Neurological: Negative for dizziness, tingling, weakness and headaches.  Endo/Heme/Allergies: Negative for environmental allergies. Does not bruise/bleed easily.  Psychiatric/Behavioral: Negative for depression. The patient is not nervous/anxious and does not have insomnia.   All other systems reviewed and are negative.    Objective:  Physical Exam Vitals reviewed.  Constitutional:      General: She is not in acute distress.    Appearance: She is well-developed.  HENT:     Head: Normocephalic and atraumatic.  Eyes:     General: No scleral icterus.    Conjunctiva/sclera: Conjunctivae normal.     Pupils: Pupils are equal, round, and reactive to light.  Neck:     Vascular: No JVD.     Trachea: No tracheal deviation.  Cardiovascular:     Rate and Rhythm: Normal rate and regular rhythm.     Heart sounds: Normal heart sounds. No murmur heard.   Pulmonary:     Effort: Pulmonary effort is normal. No tachypnea, accessory muscle usage or respiratory  distress.     Breath sounds: No stridor. No wheezing, rhonchi or rales.     Comments: Absent breath sounds in the left base Abdominal:     General: Bowel sounds are normal. There is no distension.     Palpations: Abdomen is soft.     Tenderness: There is no abdominal tenderness.  Musculoskeletal:        General: No tenderness.     Cervical back: Neck supple.  Lymphadenopathy:     Cervical: No cervical adenopathy.  Skin:    General: Skin is warm and dry.     Capillary Refill: Capillary refill takes less than 2 seconds.  Findings: No rash.  Neurological:     Mental Status: She is alert and oriented to person, place, and time.  Psychiatric:        Behavior: Behavior normal.      Vitals:   01/02/20 1355  BP: 128/80  Pulse: 88  Temp: (!) 97.2 F (36.2 C)  TempSrc: Tympanic  SpO2: 95%  Weight: 197 lb 8 oz (89.6 kg)  Height: 5\' 4"  (1.626 m)   95% on RA BMI Readings from Last 3 Encounters:  01/02/20 33.90 kg/m  08/29/13 33.80 kg/m  08/01/13 33.46 kg/m   Wt Readings from Last 3 Encounters:  01/02/20 197 lb 8 oz (89.6 kg)  08/29/13 200 lb (90.7 kg)  08/01/13 198 lb (89.8 kg)     CBC    Component Value Date/Time   WBC 7.9 04/27/2012 0841   RBC 4.47 04/27/2012 0841   HGB 13.1 08/01/2013 1047   HCT 41.8 04/27/2012 0841   MCV 93.5 04/27/2012 0841   MCH 30.3 04/27/2012 0841   MCHC 32.1 04/27/2012 0841     Chest Imaging:  Chest x-ray: 01/02/2020: Status post thoracentesis in the office with no evidence of pneumothorax. The patient's images have been independently reviewed by me.    CT chest 01/02/2020: CT chest completed immediately following thoracentesis in the office reveals studded pleural nodularity concerning for malignancy. The patient's images have been independently reviewed by me.    Pulmonary Functions Testing Results: No flowsheet data found.  FeNO:   Pathology:   Pleural fluid from 12/20/2019: CYTOLOGY - NON PAP  CASE: [id number]   PATIENT: Larsen Fury  Non-Gynecological Cytology Report   Clinical History:  Specimen Submitted: A. PLEURAL FLUID, LEFT, THORACENTESIS:   FINAL MICROSCOPIC DIAGNOSIS:  - Suspicious for malignancy  - See comment.   SPECIMEN ADEQUACY:  Satisfactory for evaluation   DIAGNOSTIC COMMENTS:  There are small aggregates of cells with slight nuclear enlargement and  irregularity and some of the cells have cytoplasmic vacuoles.  Immunohistochemistry shows these atypical clusters are positive with  cytokeratin 7, MOC31 and patchy positive with CDX2. They are negative  calretinin, cytokeratin 20, D2-40 TTF-1. The findings are suspicious  for adenocarcinoma and the immunophenotype suggest an upper  gastrointestinal primary needs to be ruled out.   Echocardiogram:   Heart Catheterization:     Assessment & Plan:     ICD-10-CM   1. Pleural effusion  J90 CT Chest Wo Contrast    DG Chest 2 View    Cytology - non gyn    Protein, body fluid (other)    Body fluid culture    Gram stain    Body fluid cell count with differential    Body fluid cell count with differential    Gram stain    Body fluid culture    Protein, body fluid (other)    Cytology - non gyn    NM PET Image Initial (PI) Skull Base To Thigh    Ambulatory referral to Oncology    CANCELED: Albumin, fluid - peritoneal or pleural    CANCELED: Glucose, Body Fluid Other    CANCELED: LD, Body Fluid    CANCELED: Body fluid cell count with differential    CANCELED: LD, Body Fluid    CANCELED: Glucose, Body Fluid Other    CANCELED: Glucose, Body Fluid Other    CANCELED: LD, Body Fluid    CANCELED: Albumin, fluid - peritoneal or pleural  2. Malignant pleural effusion  J91.0 NM PET Image Initial (PI) Skull Base  To Thigh    Ambulatory referral to Oncology  3. Pleural nodule  R22.2 Ambulatory referral to Oncology  4. Adenocarcinoma of unknown primary [facility])  C80.1 Ambulatory referral to Oncology    Discussion:  This is  a 74 year old female past medical history anxiety, hyperlipidemia, hypertension.  She is a former smoker 15+ pack years 1 pack a day.  Quit several years ago.  Initially presented with dyspnea on exertion shortness of breath found to have a left-sided effusion.  She underwent interventional radiology ultrasound-guided thoracentesis.  Pleural fluid analysis revealed lymphocyte predominant effusion.  Epic at the time of the office visit did not have a pathology report pended.  There is only a surgical pathology report this stated that a specimen was collected but no cytology report complete.  We called over to the pathology office to follow-up on the report of this.  After the report finalized at 430 this evening there was positive results for concerning malignant pleural effusion, the ICH staining had several atypical clusters positive with cytokeratin 7, MOC-31, positive CDX2.  These findings were concerning for possible adenocarcinoma of the upper GI location.  Plan: Today in the office due to the patient's progressive shortness of breath and dyspnea decision was made for therapeutic thoracentesis. Please see separate procedure note.  Plan today for tapped pleural effusion dry and sent her directly to the CT scanner for evaluation. Images reviewed with the patient already via phone.  Appears to have pleural studding concerning for pleural-based malignancy.  I have placed a referral to medical oncology and called the patient to let her know this. I have also placed orders for a PET scan to be completed ASAP. Repeat pleural fluid from today's office visit will be sent for cytology evaluation.   Current Outpatient Medications:  .  ALPRAZolam (XANAX) 0.25 MG tablet, Take 0.25 mg by mouth as needed. , Disp: , Rfl:  .  aspirin 81 MG tablet, Take 81 mg by mouth daily., Disp: , Rfl:  .  Calcium Carbonate-Vitamin D (CALCIUM 500 + D PO), Take 2 tablets by mouth daily., Disp: , Rfl:  .  cholecalciferol  (VITAMIN D) 1000 UNITS tablet, Take 1,000 Units by mouth daily., Disp: , Rfl:  .  hydrochlorothiazide (HYDRODIURIL) 25 MG tablet, Take 25 mg by mouth daily., Disp: , Rfl:  .  metoprolol succinate (TOPROL-XL) 100 MG 24 hr tablet, Take 100 mg by mouth daily. Take with or immediately following a meal., Disp: , Rfl:  .  Multiple Vitamin (MULTIVITAMIN) tablet, Take 1 tablet by mouth daily., Disp: , Rfl:  .  simvastatin (ZOCOR) 10 MG tablet, Take 10 mg by mouth at bedtime., Disp: , Rfl:  .  vitamin E 400 UNIT capsule, Take 400 Units by mouth daily., Disp: , Rfl:   I spent 65 minutes dedicated to the care of this patient on the date of this encounter to include pre-visit review of records, face-to-face time with the patient discussing conditions above, post visit ordering of testing, clinical documentation with the electronic health record, making appropriate referrals as documented, and communicating necessary findings to members of the patients care team.  Time spent dedicated this visit separate to procedure documentation.  Garner Nash, DO [facility] 01/02/2020 5:09 PM

## 2020-01-02 NOTE — Patient Instructions (Addendum)
Thank you for visiting Dr. Valeta Harms at [facility]. Today we recommend the following: Orders Placed This Encounter  Procedures  . Body fluid culture  . Gram stain  . CT Chest Wo Contrast  . DG Chest 2 View  . NM PET Image Initial (PI) Skull Base To Thigh  . Protein, body fluid (other)  . Body fluid cell count with differential  . Albumin, pleural or peritoneal fluid  . Glucose, pleural or peritoneal fluid  . Lactate dehydrogenase (pleural or peritoneal fluid)  . Ambulatory referral to Oncology   Return in about 2 weeks (around 01/16/2020).    Please do your part to reduce the spread of COVID-19.

## 2020-01-02 NOTE — Telephone Encounter (Signed)
[facility] and spoke with Truman Hayward in regards to call received for call report. Stated to [place] that pt had cxr performed and that pt is now about to have CT performed. Truman Hayward verbalized understanding and said he would make note that we were aware of the results. Nothing further needed.

## 2020-01-02 NOTE — Progress Notes (Signed)
Thoracentesis  Procedure Note  Tashae Inda  [id number]  1945-06-19  Date:01/02/20  Time:5:27 PM   Provider Performing:Hayslee Casebolt L Vear Staton   Procedure: Thoracentesis with imaging guidance ([id number])  Indication(s) Pleural Effusion  Consent Risks of the procedure as well as the alternatives and risks of each were explained to the patient and/or caregiver.  Consent for the procedure was obtained and is signed in the bedside chart  Anesthesia Topical only with 1% lidocaine    Time Out Verified patient identification, verified procedure, site/side was marked, verified correct patient position, special equipment/implants available, medications/allergies/relevant history reviewed, required imaging and test results available.   Sterile Technique Maximal sterile technique including full sterile barrier drape, hand hygiene, sterile gown, sterile gloves, mask, hair covering, sterile ultrasound probe cover (if used).  Procedure Description Ultrasound was used to identify appropriate pleural anatomy for placement and overlying skin marked. please reference image guidance from [place] below. Area of drainage cleaned and draped in sterile fashion. Lidocaine was used to anesthetize the skin and subcutaneous tissue.  1850 cc's of serosanginous appearing fluid was drained from the left pleural space. Catheter then removed and bandaid applied to site.   Complications/Tolerance None; patient tolerated the procedure well. Chest X-ray is ordered to confirm no post-procedural complication.   EBL Minimal   Specimen(s) Pleural fluid  Please see separate orders  Garner Nash, DO [facility] 01/02/2020 5:27 PM    Left pleural effusion:

## 2020-01-03 LAB — GRAM STAIN
MICRO NUMBER:: 11160848
SPECIMEN QUALITY:: ADEQUATE

## 2020-01-04 LAB — PROTEIN, BODY FLUID (OTHER): Protein, Fluid: 4.8 g/dL

## 2020-01-07 ENCOUNTER — Encounter (HOSPITAL_COMMUNITY)
Admission: RE | Admit: 2020-01-07 | Discharge: 2020-01-07 | Disposition: A | Discharge status: Home / Self Care | Payer: Medicare PPO | Source: Ambulatory Visit | Attending: Pulmonary Disease | Admitting: Pulmonary Disease

## 2020-01-07 ENCOUNTER — Other Ambulatory Visit: Payer: Self-pay

## 2020-01-07 DIAGNOSIS — I517 Cardiomegaly: Secondary | ICD-10-CM | POA: Diagnosis not present

## 2020-01-07 DIAGNOSIS — C801 Malignant (primary) neoplasm, unspecified: Secondary | ICD-10-CM | POA: Diagnosis not present

## 2020-01-07 DIAGNOSIS — J910 Malignant pleural effusion: Secondary | ICD-10-CM | POA: Insufficient documentation

## 2020-01-07 DIAGNOSIS — J90 Pleural effusion, not elsewhere classified: Secondary | ICD-10-CM | POA: Diagnosis not present

## 2020-01-07 DIAGNOSIS — K5730 Diverticulosis of large intestine without perforation or abscess without bleeding: Secondary | ICD-10-CM | POA: Insufficient documentation

## 2020-01-07 DIAGNOSIS — I2510 Atherosclerotic heart disease of native coronary artery without angina pectoris: Secondary | ICD-10-CM | POA: Insufficient documentation

## 2020-01-07 DIAGNOSIS — I700 Atherosclerosis of aorta: Secondary | ICD-10-CM | POA: Insufficient documentation

## 2020-01-07 DIAGNOSIS — C3432 Malignant neoplasm of lower lobe, left bronchus or lung: Secondary | ICD-10-CM | POA: Diagnosis not present

## 2020-01-07 DIAGNOSIS — R222 Localized swelling, mass and lump, trunk: Secondary | ICD-10-CM | POA: Diagnosis not present

## 2020-01-07 LAB — SURGICAL PATHOLOGY

## 2020-01-07 LAB — BODY FLUID CULTURE

## 2020-01-07 LAB — CYTOLOGY - NON PAP

## 2020-01-07 LAB — GLUCOSE, CAPILLARY: Glucose-Capillary: 108 mg/dL — ABNORMAL HIGH (ref 70–99)

## 2020-01-07 MED ORDER — FLUDEOXYGLUCOSE F - 18 (FDG) INJECTION
9.7500 | Freq: Once | INTRAVENOUS | Status: AC | PRN
  Administered 2020-01-07: 9.75 via INTRAVENOUS

## 2020-01-09 ENCOUNTER — Telehealth: Payer: Self-pay | Admitting: *Deleted

## 2020-01-09 DIAGNOSIS — R918 Other nonspecific abnormal finding of lung field: Secondary | ICD-10-CM

## 2020-01-09 NOTE — Telephone Encounter (Signed)
I followed up with Dr. Julien Nordmann regarding Cindy [name]'s next steps.  He would like to see her next week. I called and spoke with patient.  She is schedule to see Dr. Julien Nordmann and rad onc next week. She verbalized understanding of appt time and place.

## 2020-01-16 ENCOUNTER — Inpatient Hospital Stay: Payer: Medicare PPO

## 2020-01-16 ENCOUNTER — Encounter: Payer: Self-pay | Admitting: Internal Medicine

## 2020-01-16 ENCOUNTER — Encounter: Payer: Self-pay | Admitting: Pulmonary Disease

## 2020-01-16 ENCOUNTER — Ambulatory Visit: Payer: Medicare PPO | Admitting: Pulmonary Disease

## 2020-01-16 ENCOUNTER — Other Ambulatory Visit: Payer: Self-pay

## 2020-01-16 ENCOUNTER — Inpatient Hospital Stay: Payer: Medicare PPO | Attending: Internal Medicine | Admitting: Internal Medicine

## 2020-01-16 ENCOUNTER — Ambulatory Visit
Admission: RE | Admit: 2020-01-16 | Discharge: 2020-01-16 | Disposition: A | Discharge status: Home / Self Care | Payer: Medicare PPO | Source: Ambulatory Visit | Attending: Radiation Oncology | Admitting: Radiation Oncology

## 2020-01-16 VITALS — BP 114/66 | HR 71 | Temp 97.9°F | Wt 191.5 lb

## 2020-01-16 DIAGNOSIS — I10 Essential (primary) hypertension: Secondary | ICD-10-CM

## 2020-01-16 DIAGNOSIS — Z801 Family history of malignant neoplasm of trachea, bronchus and lung: Secondary | ICD-10-CM | POA: Diagnosis not present

## 2020-01-16 DIAGNOSIS — Z87891 Personal history of nicotine dependence: Secondary | ICD-10-CM | POA: Diagnosis not present

## 2020-01-16 DIAGNOSIS — Z5111 Encounter for antineoplastic chemotherapy: Secondary | ICD-10-CM

## 2020-01-16 DIAGNOSIS — C3492 Malignant neoplasm of unspecified part of left bronchus or lung: Secondary | ICD-10-CM

## 2020-01-16 DIAGNOSIS — F419 Anxiety disorder, unspecified: Secondary | ICD-10-CM | POA: Diagnosis not present

## 2020-01-16 DIAGNOSIS — R918 Other nonspecific abnormal finding of lung field: Secondary | ICD-10-CM | POA: Insufficient documentation

## 2020-01-16 DIAGNOSIS — E785 Hyperlipidemia, unspecified: Secondary | ICD-10-CM

## 2020-01-16 DIAGNOSIS — J948 Other specified pleural conditions: Secondary | ICD-10-CM | POA: Diagnosis not present

## 2020-01-16 DIAGNOSIS — F329 Major depressive disorder, single episode, unspecified: Secondary | ICD-10-CM | POA: Diagnosis not present

## 2020-01-16 DIAGNOSIS — J910 Malignant pleural effusion: Secondary | ICD-10-CM

## 2020-01-16 DIAGNOSIS — Z7189 Other specified counseling: Secondary | ICD-10-CM

## 2020-01-16 DIAGNOSIS — C3490 Malignant neoplasm of unspecified part of unspecified bronchus or lung: Secondary | ICD-10-CM

## 2020-01-16 LAB — CMP (CANCER CENTER ONLY)
ALT: 17 U/L (ref 0–44)
AST: 21 U/L (ref 15–41)
Albumin: 3.6 g/dL (ref 3.5–5.0)
Alkaline Phosphatase: 63 U/L (ref 38–126)
Anion gap: 8 (ref 5–15)
BUN: 11 mg/dL (ref 8–23)
CO2: 28 mmol/L (ref 22–32)
Calcium: 8.6 mg/dL — ABNORMAL LOW (ref 8.9–10.3)
Chloride: 102 mmol/L (ref 98–111)
Creatinine: 0.76 mg/dL (ref 0.44–1.00)
GFR, Estimated: >60 mL/min (ref >60)
Glucose, Bld: 126 mg/dL — ABNORMAL HIGH (ref 70–99)
Potassium: 3.4 mmol/L — ABNORMAL LOW (ref 3.5–5.1)
Sodium: 138 mmol/L (ref 135–145)
Total Bilirubin: 0.5 mg/dL (ref 0.3–1.2)
Total Protein: 6.7 g/dL (ref 6.5–8.1)

## 2020-01-16 LAB — CBC WITH DIFFERENTIAL (CANCER CENTER ONLY)
Abs Immature Granulocytes: 0.02 10*3/uL (ref 0.00–0.07)
Basophils Absolute: 0 10*3/uL (ref 0.0–0.1)
Basophils Relative: 1 %
Eosinophils Absolute: 0.1 10*3/uL (ref 0.0–0.5)
Eosinophils Relative: 1 %
HCT: 37.5 % (ref 36.0–46.0)
Hemoglobin: 12.5 g/dL (ref 12.0–15.0)
Immature Granulocytes: 0 %
Lymphocytes Relative: 16 %
Lymphs Abs: 1.1 10*3/uL (ref 0.7–4.0)
MCH: 29.5 pg (ref 26.0–34.0)
MCHC: 33.3 g/dL (ref 30.0–36.0)
MCV: 88.4 fL (ref 80.0–100.0)
Monocytes Absolute: 0.6 10*3/uL (ref 0.1–1.0)
Monocytes Relative: 8 %
Neutro Abs: 5.3 10*3/uL (ref 1.7–7.7)
Neutrophils Relative %: 74 %
Platelet Count: 263 10*3/uL (ref 150–400)
RBC: 4.24 MIL/uL (ref 3.87–5.11)
RDW: 12 % (ref 11.5–15.5)
WBC Count: 7.2 10*3/uL (ref 4.0–10.5)
nRBC: 0 % (ref 0.0–0.2)

## 2020-01-16 NOTE — Progress Notes (Signed)
[facility] Telephone:([phone]) [phone]   Fax:([phone]) [phone] Multidisciplinary thoracic oncology clinic  CONSULT NOTE  REFERRING PHYSICIAN: Dr. Leory Plowman Icard  REASON FOR CONSULTATION:  74 years old white female recently diagnosed with lung cancer.  HPI Cindy [name] is a 74 y.o. female with past medical history significant for hypertension, dyslipidemia, and anxiety and depression as well as history of smoking for around 15 years.  The patient was seen by her primary care provider complaining of cough and shortness of breath.  She had chest x-ray on December 12, 2019 and it showed sizable left-sided pleural effusion with consolidation throughout portions of the left lower lobe and lingula.  There was also left upper lobe atelectasis.  This was followed by CT scan of the chest with contrast on December 18, 2019 and it showed progressive enlargement of a left pleural effusion which was large resulting in complete collapse of the left lung.  There was indeterminate pulmonary nodules within the right lung measuring up to 5 mm.  On December 19, 2019 the patient underwent ultrasound-guided left thoracentesis with drainage of 1.5 L of pleural fluid.  The final cytology ([id number]) of the pleural fluid was suspicious for malignancy. Immunohistochemistry shows these atypical clusters are positive with cytokeratin 7, MOC31 and patchy positive with CDX2. They are negative calretinin, cytokeratin 20, D2-40 TTF-1. The findings are suspicious for adenocarcinoma and the immunophenotype suggest an upper gastrointestinal primary needs to be ruled out.  The patient had a PET scan on January 07, 2020 and it showed hypermetabolic 1.4 cm left lower lobe pulmonary nodule with hypermetabolic scattered pleural nodules and small left pleural effusion.  There was a small hypermetabolic left infrahilar lymph node and the appearance is compatible with active malignancy.  There was no findings of spread to  the abdomen/pelvis, neck or bone metastasis. Dr. Valeta Harms kindly referred the patient to me today for evaluation and recommendation regarding treatment of her condition. When seen today the patient continues to have shortness of breath with exertion as well as cough productive of whitish sputum but no significant chest pain or hemoptysis.  She lost few pounds recently.  She denied having any nausea, vomiting, diarrhea or constipation.  She denied having any headache or visual changes.  She has no fever or chills. Past medical history significant for mother with dementia and COPD, maternal grandmother had lung cancer and brother had alcoholic cirrhosis. The patient is a widow and has no children.  She is currently retired and used to work in the [employer].  She has a history for smoking 1 pack/day for around 15 years and quit 37 years ago.  She drinks half a bottle of wine every night.  She has a history of drug abuse in the past but quit in 1985.   HPI  Past Medical History:  Diagnosis Date  . Anxiety   . Depression   . Hyperlipidemia   . Hypertension     Past Surgical History:  Procedure Laterality Date  . DENTAL SURGERY  2012 - 2013   implants  . IR THORACENTESIS ASP PLEURAL SPACE W/IMG GUIDE  12/19/2019  . WISDOM TOOTH EXTRACTION  20    Family History  Problem Relation Age of Onset  . Dementia Mother   . COPD Mother   . Lung cancer Maternal Grandmother   . Cirrhosis Brother     Social History Social History   Tobacco Use  . Smoking status: Former Research scientist (life sciences)  . Smokeless tobacco: Never Used  Substance Use Topics  . Alcohol use: Yes    Alcohol/week: 10.0 standard drinks    Types: 10 drink(s) per week  . Drug use: No    No Known Allergies  Current Outpatient Medications  Medication Sig Dispense Refill  . ALPRAZolam (XANAX) 0.25 MG tablet Take 0.25 mg by mouth as needed.     Marland Kitchen aspirin 81 MG tablet Take 81 mg by mouth daily.    . Calcium Carbonate-Vitamin D (CALCIUM 500  + D PO) Take 2 tablets by mouth daily.    . cholecalciferol (VITAMIN D) 1000 UNITS tablet Take 1,000 Units by mouth daily.    . hydrochlorothiazide (HYDRODIURIL) 25 MG tablet Take 25 mg by mouth daily.    . metoprolol succinate (TOPROL-XL) 100 MG 24 hr tablet Take 100 mg by mouth daily. Take with or immediately following a meal.    . Multiple Vitamin (MULTIVITAMIN) tablet Take 1 tablet by mouth daily.    . simvastatin (ZOCOR) 20 MG tablet     . vitamin E 400 UNIT capsule Take 400 Units by mouth daily.     No current facility-administered medications for this visit.    Review of Systems  Constitutional: positive for fatigue and weight loss Eyes: negative Ears, nose, mouth, throat, and face: negative Respiratory: positive for cough and dyspnea on exertion Cardiovascular: negative Gastrointestinal: negative Genitourinary:negative Integument/breast: negative Hematologic/lymphatic: negative Musculoskeletal:negative Neurological: negative Behavioral/Psych: negative Endocrine: negative Allergic/Immunologic: negative  Physical Exam  DGU:YQIHK, healthy, no distress, well nourished, well developed and anxious SKIN: skin color, texture, turgor are normal, no rashes or significant lesions HEAD: Normocephalic, No masses, lesions, tenderness or abnormalities EYES: normal, PERRLA, Conjunctiva are pink and non-injected EARS: External ears normal, Canals clear OROPHARYNX:no exudate, no erythema and lips, buccal mucosa, and tongue normal  NECK: supple, no adenopathy, no JVD LYMPH:  no palpable lymphadenopathy, no hepatosplenomegaly BREAST:not examined LUNGS: coarse sounds heard, decreased breath sounds HEART: regular rate & rhythm, no murmurs and no gallops ABDOMEN:abdomen soft, non-tender, normal bowel sounds and no masses or organomegaly BACK: No CVA tenderness, Range of motion is normal EXTREMITIES:no joint deformities, effusion, or inflammation, no edema  NEURO: alert & oriented x 3  with fluent speech, no focal motor/sensory deficits  PERFORMANCE STATUS: ECOG 1  LABORATORY DATA: Lab Results  Component Value Date   WBC 7.9 04/27/2012   HGB 13.1 08/01/2013   HCT 41.8 04/27/2012   MCV 93.5 04/27/2012      Chemistry   No results found for: NA, K, CL, CO2, BUN, CREATININE, GLU No results found for: CALCIUM, ALKPHOS, AST, ALT, BILITOT     RADIOGRAPHIC STUDIES: DG Chest 1 View  Result Date: 12/19/2019 CLINICAL DATA:  Status post left-sided thoracentesis EXAM: CHEST  1 VIEW COMPARISON:  Chest CT December 18, 2019; chest radiograph December 12, 2019 FINDINGS: Left pleural effusion is smaller following thoracentesis on the left. There is a moderate partially loculated pleural effusion remaining on the left with compressive atelectasis in the left mid and lower lung zones. A degree of superimposed infiltrate in these areas on the left cannot be excluded. The right lung is clear. Heart size and pulmonary vascular normal. No adenopathy. There is aortic atherosclerosis. No bone lesions. IMPRESSION: Left pleural effusion smaller following thoracentesis. No pneumothorax. Loculated pleural effusion on the left with atelectasis and questionable consolidation in portions of the left mid and lower lung regions. Right lung clear. Stable cardiac silhouette. Aortic Atherosclerosis (ICD10-I70.0). Electronically Signed   By: Lowella Grip III M.D.  On: 12/19/2019 11:39   DG Chest 2 View  Result Date: 01/02/2020 CLINICAL DATA:  Follow-up after thoracentesis. EXAM: CHEST - 2 VIEW COMPARISON:  Chest x-ray 12/24/2019, CT chest 12/18/2019 FINDINGS: The heart size and mediastinal contours are within normal limits. Biapical pleural/pulmonary scarring. Nodular like opacity at the left lateral base. Increased interstitial markings within the left lower lobe and lingula likely due to re-expansion edema of the lungs. No focal consolidation. No pulmonary edema. Interval decrease and almost complete  resolution of a now trace left pleural effusion. No pneumothorax. No acute osseous abnormality. Multilevel degenerative changes of the spine. IMPRESSION: 1. Almost complete interval resolution of a now trace left pleural effusion status post thoracentesis. No pneumothorax. 2. Nodular like density at the left lateral base of unclear etiology. Finding could represent a pulmonary nodule. Recommend CT chest with intravenous contrast for further evaluation. These results will be called to the ordering clinician or representative by the Radiologist Assistant, and communication documented in the PACS or [employer]. Electronically Signed   By: Iven Finn M.D.   On: 01/02/2020 15:38   DG Chest 2 View  Result Date: 12/25/2019 CLINICAL DATA:  Follow-up pleural effusion EXAM: CHEST - 2 VIEW COMPARISON:  12/19/2019 FINDINGS: Moderately large left pleural effusion is slightly larger. Increase in left lower lobe atelectasis. Right lung remains clear.  Negative for heart failure or edema. IMPRESSION: Moderate left effusion shows mild interval enlargement. Progressive left lower lobe atelectasis. Electronically Signed   By: Franchot Gallo M.D.   On: 12/25/2019 15:24   CT Chest Wo Contrast  Result Date: 01/02/2020 CLINICAL DATA:  Follow-up pleural effusion.  Pain and cough. EXAM: CT CHEST WITHOUT CONTRAST TECHNIQUE: Multidetector CT imaging of the chest was performed following the standard protocol without IV contrast. COMPARISON:  Chest CT 12/18/2019 FINDINGS: Cardiovascular: The heart is normal in size for age. No pericardial effusion. Stable mild tortuosity and moderate atherosclerotic calcifications involving the thoracic aorta. Stable scattered three-vessel coronary artery calcifications. Mediastinum/Nodes: No mediastinal or hilar mass or adenopathy. Small scattered lymph nodes are stable. The esophagus is grossly normal. Lungs/Pleura: Interval resolution or evacuation of the left pleural fluid collection.  There is moderate pleural nodularity with some areas of irregular pleural thickening. Could not exclude pleural nodules or pleural neoplasm. Recommend correlation with the pleural fluid that was drained. Patchy ground-glass opacity in the left lung is likely edema and or atelectasis. No discrete lung mass is identified. The right lung is clear. No pleural effusion or pleural nodules. No pulmonary lesions. Upper Abdomen: The upper abdomen is unremarkable. No worrisome hepatic or adrenal gland lesions without contrast. No upper abdominal adenopathy. Scattered vascular calcifications. Musculoskeletal: No breast masses, supraclavicular or axillary adenopathy. The thyroid gland appears normal. The bony thorax is intact.  No worrisome bone lesions. IMPRESSION: 1. Interval resolution or evacuation of the left pleural fluid collection. 2. Moderate diffuse pleural nodularity with some areas of irregular pleural thickening. Findings suspicious for pleural neoplasm. Recommend correlation with the pleural fluid that was drained. 3. Patchy ground-glass opacity in the left lung is likely edema and or atelectasis. No discrete lung mass is identified. 4. No mediastinal or hilar mass or adenopathy. 5. Stable atherosclerotic calcifications involving the thoracic aorta and coronary arteries. Aortic Atherosclerosis (ICD10-I70.0). Electronically Signed   By: Marijo Sanes M.D.   On: 01/02/2020 16:32   CT CHEST W CONTRAST  Result Date: 12/18/2019 CLINICAL DATA:  Dyspnea EXAM: CT CHEST WITH CONTRAST TECHNIQUE: Multidetector CT imaging of the chest was  performed during intravenous contrast administration. CONTRAST:  2mL OMNIPAQUE IOHEXOL 300 MG/ML  SOLN COMPARISON:  None. FINDINGS: Cardiovascular: Mild coronary artery calcification. Global cardiac size within normal limits. No pericardial effusion. Central pulmonary arteries are of normal caliber. Moderate atherosclerotic calcification within the aortic arch and descending thoracic  aorta. The thoracic aorta is of normal caliber. Mediastinum/Nodes: No enlarged mediastinal, hilar, or axillary lymph nodes. Thyroid gland, trachea, and esophagus demonstrate no significant findings. Lungs/Pleura: Large left pleural effusion has enlarged since prior chest radiograph 12/12/2019, and now nearly completely collapse of the left lung. No significant pleural enhancement or pleural nodularity identified. 5 mm noncalcified pulmonary nodule noted with right upper lobe, axial image # 69, indeterminate. 4 mm subpleural pulmonary nodule, right apex, axial image # 19, indeterminate. Right lung is otherwise clear. No pneumothorax. No pleural effusion on the right. The central airways are widely patent. Upper Abdomen: Moderate hepatic steatosis. Musculoskeletal: No lytic or blastic bone lesions. No acute bone abnormality. IMPRESSION: Progressive enlargement of a left pleural effusion, now large in resulting in near complete collapse of the left lung. Diagnostic and therapeutic thoracentesis may be helpful for further management. Indeterminate pulmonary nodules within the right lung measuring up to 5 mm. No follow-up needed if patient is low-risk (and has no known or suspected primary neoplasm). Non-contrast chest CT can be considered in 12 months if patient is high-risk. This recommendation follows the consensus statement: Guidelines for Management of Incidental Pulmonary Nodules Detected on CT Images: From the Fleischner Society 2017; Radiology 2017; 284:228-243. Moderate hepatic steatosis. Aortic Atherosclerosis (ICD10-I70.0). Electronically Signed   By: Fidela Salisbury MD   On: 12/18/2019 11:12   NM PET Image Initial (PI) Skull Base To Thigh  Result Date: 01/07/2020 CLINICAL DATA:  Initial treatment strategy for pulmonary nodule and pleural effusion. EXAM: NUCLEAR MEDICINE PET SKULL BASE TO THIGH TECHNIQUE: 9.8 mCi F-18 FDG was injected intravenously. Full-ring PET imaging was performed from the skull base to  thigh after the radiotracer. CT data was obtained and used for attenuation correction and anatomic localization. Fasting blood glucose: 108 mg/dl COMPARISON:  CT chest 01/02/2020 FINDINGS: Mediastinal blood pool activity: SUV max 3.0 Liver activity: SUV max NA NECK: Symmetric glottic activity, maximum SUV 7.3, likely physiologic. No specific CT abnormality in this vicinity. Incidental CT findings: Bilateral common carotid atherosclerotic calcification. CHEST: Hypermetabolic solid pleural nodules along the small left pleural effusion compatible with pleural malignancy. Along the left hemidiaphragm a nodule measuring about 1.8 by 1.3 cm has a maximum SUV of 8.6. Along the left posterior costophrenic angle, a hypermetabolic nodule measuring about 3.0 by 1.1 cm on image 108 of series 4 has a maximum SUV of 6.7. Multiple additional foci of pleural metabolic tumor identified. Moreover, in the superior segment left lower lobe there is some parenchymal nodularity there is hypermetabolic, including a 1.3 by 1.4 cm nodule on image 27 of series 8 with maximum SUV 9.6. A left infrahilar lymph node measuring 0.7 cm in short axis on image 73 of series 4 has a maximum SUV of 5.9. Incidental CT findings: Coronary, aortic arch, and branch vessel atherosclerotic vascular disease. Mild cardiomegaly. ABDOMEN/PELVIS: No significant abnormal hypermetabolic activity in this region. 1.0 cm left inguinal lymph node has a maximum SUV of 3.2, roughly similar to blood pool and probably incidental. Incidental CT findings: Aortoiliac atherosclerotic vascular disease. Sigmoid colon diverticulosis. SKELETON: No significant abnormal hypermetabolic activity in this region. Incidental CT findings: none IMPRESSION: 1. Hypermetabolic 1.4 cm left lower lobe pulmonary nodule, with  hypermetabolic scattered pleural nodules and small left pleural effusion. Small hypermetabolic left infrahilar lymph node. Appearance compatible with active malignancy. No  findings of spread to the abdomen/pelvis, neck, or skeleton. 2. Other imaging findings of potential clinical significance: Aortic Atherosclerosis (ICD10-I70.0). Coronary atherosclerosis. Mild cardiomegaly. Sigmoid colon diverticulosis. Electronically Signed   By: Van Clines M.D.   On: 01/07/2020 12:12   IR THORACENTESIS ASP PLEURAL SPACE W/IMG GUIDE  Result Date: 12/19/2019 INDICATION: Shortness of breath. Large left pleural effusion. Request for diagnostic and therapeutic thoracentesis. EXAM: ULTRASOUND GUIDED LEFT THORACENTESIS MEDICATIONS: 1% PLAIN LIDOCAINE, 1 ML COMPLICATIONS: None immediate. PROCEDURE: An ultrasound guided thoracentesis was thoroughly discussed with the patient and questions answered. The benefits, risks, alternatives and complications were also discussed. The patient understands and wishes to proceed with the procedure. Written consent was obtained. Ultrasound was performed to localize and mark an adequate pocket of fluid in the LEFT chest. The area was then prepped and draped in the normal sterile fashion. 1% Lidocaine was used for local anesthesia. Under ultrasound guidance a 6 Fr Safe-T-Centesis catheter was introduced. Thoracentesis was performed. The catheter was removed and a dressing applied. FINDINGS: A total of approximately 1.5 L of clear yellow fluid was removed. Samples were sent to the laboratory as requested by the clinical team. IMPRESSION: Successful ultrasound guided left thoracentesis yielding 1.5 L of pleural fluid. Read by: Ascencion Dike PA-C Electronically Signed   By: Corrie Mckusick D.O.   On: 12/19/2019 11:42    ASSESSMENT: This is a very pleasant 74 years old white female with stage IV (T1b, N1, M1 a) non-small cell lung cancer favoring adenocarcinoma diagnosed in October 2021.  The pathologic immunohistochemical stains suggestive of upper GI primary but there is no finding on the PET scan or imaging study to support this possibility.   PLAN: I had a  lengthy discussion with the patient today about her current disease stage, prognosis and treatment options. I personally and independently reviewed the PET scan images and discussed the result and showed the images to the patient today. I recommended for her to complete the staging work-up by ordering MRI of the brain to rule out brain metastasis. The patient was seen earlier today by Dr. Valeta Harms and he referred her to IR for consideration of CT-guided core biopsy of one of the left lung mass for more confirmation of her tissue diagnosis as well as having material for molecular studies if needed. In the meantime I will send blood test to [facility] for molecular studies. I will arrange for the patient to come back for follow-up visit in around 2 weeks for evaluation and discussion of her treatment options based on molecular studies and will complete the staging work-up. If she had recurrent left pleural effusion, I will arrange for the patient to have a Pleurx catheter placed for drainage. The patient was advised to call immediately if she has any concerning symptoms in the interval. The patient voices understanding of current disease status and treatment options and is in agreement with the current care plan.  All questions were answered. The patient knows to call the clinic with any problems, questions or concerns. We can certainly see the patient much sooner if necessary.  Thank you so much for allowing me to participate in the care of Cindy [name]. I will continue to follow up the patient with you and assist in her care.  The total time spent in the appointment was 90 minutes.  Disclaimer: This note was dictated with  voice recognition software. Similar sounding words can inadvertently be transcribed and may not be corrected upon review.   Eilleen Kempf January 16, 2020, 2:53 PM

## 2020-01-16 NOTE — Progress Notes (Signed)
Synopsis: Referred in November 2021 for pleural effusion by [name], MD  Subjective:   PATIENT ID: Cindy [name] GENDER: female DOB: January 13, 1946, MRN: [id number]  Chief Complaint  Patient presents with  . Follow-up    PET scan done on 11/9 --pt has appt with oncology today    This is a 74 year old female past medical history of anxiety, hyperlipidemia, hypertension, former smoker, 15+ years, 1ppd.  Patient referred for evaluation of pleural effusion.  Patient underwent CT scan of the chest on 12/18/2019 which revealed progressive enlargement of a left-sided pleural effusion.  She also had indeterminate pulmonary nodules within the right lung 5 mm in size. History of skin cancers, grandmother with lung cancer.  OV 01/16/2020: Patient here today for follow-up after recent nuclear medicine PET scan.  Pleural effusion tapped again recently cytology also consistent with malignant effusion.  We reviewed the patient's case this morning in the medical thoracic oncology conference.  Case was discussed needing additional tissue for molecular cytogenetics.  We discussed today in the office bronchoscopy versus CT-guided biopsy.  I think last resort would be thoracoscopy with pleural biopsy if needed.   Past Medical History:  Diagnosis Date  . Anxiety   . Depression   . Hyperlipidemia   . Hypertension      Family History  Problem Relation Age of Onset  . Dementia Mother   . COPD Mother   . Lung cancer Maternal Grandmother   . Cirrhosis Brother      Past Surgical History:  Procedure Laterality Date  . DENTAL SURGERY  2012 - 2013   implants  . IR THORACENTESIS ASP PLEURAL SPACE W/IMG GUIDE  12/19/2019  . WISDOM TOOTH EXTRACTION  20    Social History   Socioeconomic History  . Marital status: Widowed    Spouse name: Not on file  . Number of children: 0  . Years of education: Not on file  . Highest education level: Not on file  Occupational History    Employer: [employer]  [place]  Tobacco Use  . Smoking status: Former Research scientist (life sciences)  . Smokeless tobacco: Never Used  Substance and Sexual Activity  . Alcohol use: Yes    Alcohol/week: 10.0 standard drinks    Types: 10 drink(s) per week  . Drug use: No  . Sexual activity: Never    Birth control/protection: Abstinence, Post-menopausal  Other Topics Concern  . Not on file  Social History Narrative  . Not on file   Social Determinants of Health   Financial Resource Strain:   . Difficulty of Paying Living Expenses: Not on file  Food Insecurity:   . Worried About Charity fundraiser in the Last Year: Not on file  . Ran Out of Food in the Last Year: Not on file  Transportation Needs:   . Lack of Transportation (Medical): Not on file  . Lack of Transportation (Non-Medical): Not on file  Physical Activity:   . Days of Exercise per Week: Not on file  . Minutes of Exercise per Session: Not on file  Stress:   . Feeling of Stress : Not on file  Social Connections:   . Frequency of Communication with Friends and Family: Not on file  . Frequency of Social Gatherings with Friends and Family: Not on file  . Attends Religious Services: Not on file  . Active Member of Clubs or Organizations: Not on file  . Attends Archivist Meetings: Not on file  . Marital Status: Not on file  Intimate Partner Violence:   . Fear of Current or Ex-Partner: Not on file  . Emotionally Abused: Not on file  . Physically Abused: Not on file  . Sexually Abused: Not on file     No Known Allergies   Outpatient Medications Prior to Visit  Medication Sig Dispense Refill  . ALPRAZolam (XANAX) 0.25 MG tablet Take 0.25 mg by mouth as needed.     Marland Kitchen aspirin 81 MG tablet Take 81 mg by mouth daily.    . Calcium Carbonate-Vitamin D (CALCIUM 500 + D PO) Take 2 tablets by mouth daily.    . cholecalciferol (VITAMIN D) 1000 UNITS tablet Take 1,000 Units by mouth daily.    . hydrochlorothiazide (HYDRODIURIL) 25 MG tablet Take 25 mg by  mouth daily.    . metoprolol succinate (TOPROL-XL) 100 MG 24 hr tablet Take 100 mg by mouth daily. Take with or immediately following a meal.    . Multiple Vitamin (MULTIVITAMIN) tablet Take 1 tablet by mouth daily.    . simvastatin (ZOCOR) 20 MG tablet     . vitamin E 400 UNIT capsule Take 400 Units by mouth daily.    . simvastatin (ZOCOR) 10 MG tablet Take 10 mg by mouth at bedtime. (Patient not taking: Reported on 01/16/2020)     No facility-administered medications prior to visit.    Review of Systems  Constitutional: Negative for chills, fever, malaise/fatigue and weight loss.  HENT: Negative for hearing loss, sore throat and tinnitus.   Eyes: Negative for blurred vision and double vision.  Respiratory: Positive for shortness of breath. Negative for cough, hemoptysis, sputum production, wheezing and stridor.   Cardiovascular: Negative for chest pain, palpitations, orthopnea, leg swelling and PND.  Gastrointestinal: Negative for abdominal pain, constipation, diarrhea, heartburn, nausea and vomiting.  Genitourinary: Negative for dysuria, hematuria and urgency.  Musculoskeletal: Negative for joint pain and myalgias.  Skin: Negative for itching and rash.  Neurological: Negative for dizziness, tingling, weakness and headaches.  Endo/Heme/Allergies: Negative for environmental allergies. Does not bruise/bleed easily.  Psychiatric/Behavioral: Negative for depression. The patient is not nervous/anxious and does not have insomnia.   All other systems reviewed and are negative.   Objective:  Physical Exam Vitals reviewed.  Constitutional:      General: She is not in acute distress.    Appearance: She is well-developed.  HENT:     Head: Normocephalic and atraumatic.  Eyes:     General: No scleral icterus.    Conjunctiva/sclera: Conjunctivae normal.     Pupils: Pupils are equal, round, and reactive to light.  Neck:     Vascular: No JVD.     Trachea: No tracheal deviation.   Cardiovascular:     Rate and Rhythm: Normal rate and regular rhythm.     Heart sounds: Normal heart sounds. No murmur heard.   Pulmonary:     Effort: Pulmonary effort is normal. No tachypnea, accessory muscle usage or respiratory distress.     Breath sounds: No stridor. No wheezing, rhonchi or rales.     Comments: diminshed breath sounds in left base  Abdominal:     General: Bowel sounds are normal. There is no distension.     Palpations: Abdomen is soft.     Tenderness: There is no abdominal tenderness.  Musculoskeletal:        General: No tenderness.     Cervical back: Neck supple.  Lymphadenopathy:     Cervical: No cervical adenopathy.  Skin:    General: Skin is warm  and dry.     Capillary Refill: Capillary refill takes less than 2 seconds.     Findings: No rash.  Neurological:     Mental Status: She is alert and oriented to person, place, and time.  Psychiatric:        Behavior: Behavior normal.     Vitals:   01/16/20 1146  BP: 114/66  Pulse: 71  Temp: 97.9 F (36.6 C)  TempSrc: Tympanic  SpO2: 96%  Weight: 191 lb 8 oz (86.9 kg)   96% on RA BMI Readings from Last 3 Encounters:  01/16/20 32.87 kg/m  01/02/20 33.90 kg/m  08/29/13 33.80 kg/m   Wt Readings from Last 3 Encounters:  01/16/20 191 lb 8 oz (86.9 kg)  01/02/20 197 lb 8 oz (89.6 kg)  08/29/13 200 lb (90.7 kg)    CBC    Component Value Date/Time   WBC 7.9 04/27/2012 0841   RBC 4.47 04/27/2012 0841   HGB 13.1 08/01/2013 1047   HCT 41.8 04/27/2012 0841   MCV 93.5 04/27/2012 0841   MCH 30.3 04/27/2012 0841   MCHC 32.1 04/27/2012 0841     Chest Imaging:  Chest x-ray: 01/02/2020: Status post thoracentesis in the office with no evidence of pneumothorax. The patient's images have been independently reviewed by me.    CT chest 01/02/2020: CT chest completed immediately following thoracentesis in the office reveals studded pleural nodularity concerning for malignancy. The patient's images have  been independently reviewed by me.    Pulmonary Functions Testing Results: No flowsheet data found.  FeNO:   Pathology:   Pleural fluid from 12/20/2019: CYTOLOGY - NON PAP  CASE: [id number]  PATIENT: Heleena Loudon  Non-Gynecological Cytology Report   Clinical History:  Specimen Submitted: A. PLEURAL FLUID, LEFT, THORACENTESIS:   FINAL MICROSCOPIC DIAGNOSIS:  - Suspicious for malignancy  - See comment.   SPECIMEN ADEQUACY:  Satisfactory for evaluation   DIAGNOSTIC COMMENTS:  There are small aggregates of cells with slight nuclear enlargement and  irregularity and some of the cells have cytoplasmic vacuoles.  Immunohistochemistry shows these atypical clusters are positive with  cytokeratin 7, MOC31 and patchy positive with CDX2. They are negative  calretinin, cytokeratin 20, D2-40 TTF-1. The findings are suspicious  for adenocarcinoma and the immunophenotype suggest an upper  gastrointestinal primary needs to be ruled out.   Echocardiogram:   Heart Catheterization:      Assessment & Plan:     ICD-10-CM   1. Malignant pleural effusion  J91.0 CT GUIDED NEEDLE PLACEMENT  2. Pleural mass  J94.8 CT GUIDED NEEDLE PLACEMENT  3. Former smoker  Z87.891    Discussion:  This is a 74 year old female past medical history of anxiety, hypertension hyperlipidemia, former smoker, 15-pack-year history.  Patient found to have a new left-sided pleural effusion underwent ultrasound-guided thoracentesis which revealed lymphocyte predominant effusion and malignant cells.  The IHC staining of these malignant atypical clusters included positive for cytokeratin 7, mock 31, positive CDX2 which was possible upper GI location.  The PET scan was completed this week which revealed a hypermetabolic lower lobe nodule as well as associated studding pleural disease.  This is concerning for stage IV M1 a adenocarcinoma.  We discussed need for additional tissue for molecular cytogenetics and  medical thoracic oncology conference this morning.  They can expect steps for this would be including a CT-guided biopsy of the pleural disease this would allow additional tissue sampling without her having undergo general anesthesia.  Second set next best step would be  a bronchoscopy for tissue sampling of the hilum and lung mass if needed.  If we were unable to obtain enough tissue for either of these neck steps a thoracoscopy could be completed by thoracic surgery for pleural biopsy.  Would like to prefer to avoid this unless needed.  We discussed each of these steps today in the office and the patient is agreeable to proceed with referral to CT-guided biopsy.  She has a follow-up appointment and establish care with Dr. Earlie Server at the cancer center this afternoon.   Current Outpatient Medications:  .  ALPRAZolam (XANAX) 0.25 MG tablet, Take 0.25 mg by mouth as needed. , Disp: , Rfl:  .  aspirin 81 MG tablet, Take 81 mg by mouth daily., Disp: , Rfl:  .  Calcium Carbonate-Vitamin D (CALCIUM 500 + D PO), Take 2 tablets by mouth daily., Disp: , Rfl:  .  cholecalciferol (VITAMIN D) 1000 UNITS tablet, Take 1,000 Units by mouth daily., Disp: , Rfl:  .  hydrochlorothiazide (HYDRODIURIL) 25 MG tablet, Take 25 mg by mouth daily., Disp: , Rfl:  .  metoprolol succinate (TOPROL-XL) 100 MG 24 hr tablet, Take 100 mg by mouth daily. Take with or immediately following a meal., Disp: , Rfl:  .  Multiple Vitamin (MULTIVITAMIN) tablet, Take 1 tablet by mouth daily., Disp: , Rfl:  .  simvastatin (ZOCOR) 20 MG tablet, , Disp: , Rfl:  .  vitamin E 400 UNIT capsule, Take 400 Units by mouth daily., Disp: , Rfl:   I spent 42 minutes dedicated to the care of this patient on the date of this encounter to include pre-visit review of records, face-to-face time with the patient discussing conditions above, post visit ordering of testing, clinical documentation with the electronic health record, making appropriate referrals  as documented, and communicating necessary findings to members of the patients care team.    Garner Nash, DO [facility] 01/16/2020 12:01 PM

## 2020-01-16 NOTE — Patient Instructions (Signed)
Thank you for visiting Dr. Valeta Harms at [facility]. Today we recommend the following:  Orders Placed This Encounter  Procedures  . CT GUIDED NEEDLE PLACEMENT   Call me! If you need anything.   Return in about 3 months (around 04/17/2020). with Dr. Valeta Harms     Please do your part to reduce the spread of COVID-19.

## 2020-01-17 ENCOUNTER — Encounter (HOSPITAL_COMMUNITY): Payer: Self-pay

## 2020-01-17 DIAGNOSIS — C3492 Malignant neoplasm of unspecified part of left bronchus or lung: Secondary | ICD-10-CM | POA: Insufficient documentation

## 2020-01-17 DIAGNOSIS — Z7189 Other specified counseling: Secondary | ICD-10-CM | POA: Insufficient documentation

## 2020-01-17 DIAGNOSIS — Z5111 Encounter for antineoplastic chemotherapy: Secondary | ICD-10-CM | POA: Insufficient documentation

## 2020-01-17 NOTE — Progress Notes (Unsigned)
Cindy [name] Female, 74 y.o., 1945/12/13 MRN:  [id number] Phone:  [phone] [name]) PCP:  [name], MD Coverage:  [facility] Medicare/[employer] Medicare Choice Ppo Next Appt With Radiology ([facility]-MR 1) 01/22/2020 at 8:00 PM  RE: Biopsy Received: Today Message Details  Suttle, Rosanne Ashing, MD  [name] Approved for CT guided left posterior pleural mass biopsy. Best location in my opinion is series 4, image 79 on CT portion of recent PET/CT (01/07/20).    [name]   Previous Messages  ----- Message -----  From: [name]  Sent: 01/17/2020 11:22 AM EST  To: Ir Procedure Requests  Subject: Biopsy                      Procedure Requested: CT Biopsy    Reason for Procedure: pleural biopsy, PET avid lesion    Provider Requesting: June Leap  Provider Telephone: [phone]    Other Info:

## 2020-01-20 ENCOUNTER — Telehealth: Payer: Self-pay | Admitting: Medical Oncology

## 2020-01-20 ENCOUNTER — Encounter: Payer: Self-pay | Admitting: *Deleted

## 2020-01-20 NOTE — Progress Notes (Signed)
I contacted Dr. Valeta Harms regarding bx, wait for response

## 2020-01-20 NOTE — Telephone Encounter (Signed)
CT biopsy- Should she wait on CT biopsy until other tests resulted?  COVID vaccine and Mammogram.Should she get COVID booster and her annual mammogram? I told pt to get her mammogram  First and then her COVID booster.

## 2020-01-21 DIAGNOSIS — Z1231 Encounter for screening mammogram for malignant neoplasm of breast: Secondary | ICD-10-CM | POA: Diagnosis not present

## 2020-01-22 ENCOUNTER — Ambulatory Visit (HOSPITAL_COMMUNITY)
Admission: RE | Admit: 2020-01-22 | Discharge: 2020-01-22 | Disposition: A | Discharge status: Home / Self Care | Payer: Medicare PPO | Source: Ambulatory Visit | Attending: Internal Medicine | Admitting: Internal Medicine

## 2020-01-22 DIAGNOSIS — G9389 Other specified disorders of brain: Secondary | ICD-10-CM | POA: Diagnosis not present

## 2020-01-22 DIAGNOSIS — C3490 Malignant neoplasm of unspecified part of unspecified bronchus or lung: Secondary | ICD-10-CM | POA: Insufficient documentation

## 2020-01-22 MED ORDER — GADOBUTROL 1 MMOL/ML IV SOLN
9.0000 mL | Freq: Once | INTRAVENOUS | Status: AC | PRN
  Administered 2020-01-22: 9 mL via INTRAVENOUS

## 2020-01-28 ENCOUNTER — Encounter: Payer: Self-pay | Admitting: Internal Medicine

## 2020-01-29 LAB — GUARDANT 360

## 2020-01-30 ENCOUNTER — Other Ambulatory Visit: Payer: Self-pay

## 2020-01-30 ENCOUNTER — Inpatient Hospital Stay: Payer: Medicare PPO | Attending: Internal Medicine | Admitting: Internal Medicine

## 2020-01-30 VITALS — BP 155/76 | HR 70 | Temp 98.6°F | Resp 18 | Ht 64.0 in | Wt 190.9 lb

## 2020-01-30 DIAGNOSIS — Z7189 Other specified counseling: Secondary | ICD-10-CM

## 2020-01-30 DIAGNOSIS — C3492 Malignant neoplasm of unspecified part of left bronchus or lung: Secondary | ICD-10-CM | POA: Diagnosis not present

## 2020-01-30 DIAGNOSIS — Z5111 Encounter for antineoplastic chemotherapy: Secondary | ICD-10-CM

## 2020-01-30 DIAGNOSIS — C3491 Malignant neoplasm of unspecified part of right bronchus or lung: Secondary | ICD-10-CM | POA: Insufficient documentation

## 2020-01-30 NOTE — Progress Notes (Signed)
[place] [phone]   [phone]  OFFICE PROGRESS NOTE  [name], MD [place] [address] [place] [address]  DIAGNOSIS: stage IV (T1b, N1, M1 a) non-small cell lung cancer favoring adenocarcinoma diagnosed in October 2021.  The pathologic immunohistochemical stains suggestive of upper GI primary but there is no finding on the PET scan or imaging study to support this possibility.  DETECTED ALTERATION(S) / BIOMARKER(S) % CFDNA OR AMPLIFICATION ASSOCIATED FDA-APPROVED THERAPIES CLINICAL TRIAL AVAILABILITY [id number] 0.4% None   PRIOR THERAPY: None  CURRENT THERAPY: None  INTERVAL HISTORY: Cindy [name] 74 y.o. female returns to the clinic today for follow-up visit.  2 of her friends were available by FaceTime during the visit.  The patient is feeling fine today with no concerning complaints except for mild shortness of breath with exertion.  The patient denied having any chest pain, cough or hemoptysis.  She denied having any fever or chills.  She has no nausea, vomiting, diarrhea or constipation.  She denied having any headache or visual changes.  She has no recent weight loss or night sweats.  She had several studies performed since her last visit including MRI of the brain as well as molecular studies by [employer].  The patient is here today for evaluation and discussion of her treatment options based on the recent imaging and blood work.  MEDICAL HISTORY: Past Medical History:  Diagnosis Date  . Anxiety   . Depression   . Hyperlipidemia   . Hypertension     ALLERGIES:  has No Known Allergies.  MEDICATIONS:  Current Outpatient Medications  Medication Sig Dispense Refill  . ALPRAZolam (XANAX) 0.25 MG tablet Take 0.25 mg by mouth as needed.     Marland Kitchen aspirin 81 MG tablet Take 81 mg by mouth daily.    . Calcium Carbonate-Vitamin D (CALCIUM 500 + D PO) Take 2 tablets by mouth daily.    . cholecalciferol (VITAMIN D)  1000 UNITS tablet Take 1,000 Units by mouth daily.    . hydrochlorothiazide (HYDRODIURIL) 25 MG tablet Take 25 mg by mouth daily.    . metoprolol succinate (TOPROL-XL) 100 MG 24 hr tablet Take 100 mg by mouth daily. Take with or immediately following a meal.    . Multiple Vitamin (MULTIVITAMIN) tablet Take 1 tablet by mouth daily.    . simvastatin (ZOCOR) 20 MG tablet     . vitamin E 400 UNIT capsule Take 400 Units by mouth daily.     No current facility-administered medications for this visit.    SURGICAL HISTORY:  Past Surgical History:  Procedure Laterality Date  . DENTAL SURGERY  2012 - 2013   implants  . IR THORACENTESIS ASP PLEURAL SPACE W/IMG GUIDE  12/19/2019  . WISDOM TOOTH EXTRACTION  20    REVIEW OF SYSTEMS:  Constitutional: positive for fatigue Eyes: negative Ears, nose, mouth, throat, and face: negative Respiratory: positive for dyspnea on exertion Cardiovascular: negative Gastrointestinal: negative Genitourinary:negative Integument/breast: negative Hematologic/lymphatic: negative Musculoskeletal:negative Neurological: negative Behavioral/Psych: negative Endocrine: negative Allergic/Immunologic: negative   PHYSICAL EXAMINATION: General appearance: alert, cooperative, fatigued and no distress Head: Normocephalic, without obvious abnormality, atraumatic Neck: no adenopathy, no JVD, supple, symmetrical, trachea midline and thyroid not enlarged, symmetric, no tenderness/mass/nodules Lymph nodes: Cervical, supraclavicular, and axillary nodes normal. Resp: clear to auscultation bilaterally Back: symmetric, no curvature. ROM normal. No CVA tenderness. Cardio: regular rate and rhythm, S1, S2 normal, no murmur, click, rub or gallop GI: soft, non-tender; bowel  sounds normal; no masses,  no organomegaly Extremities: extremities normal, atraumatic, no cyanosis or edema Neurologic: Alert and oriented X 3, normal strength and tone. Normal symmetric reflexes. Normal  coordination and gait  ECOG PERFORMANCE STATUS: 1 - Symptomatic but completely ambulatory  Blood pressure (!) 155/76, pulse 70, temperature 98.6 F (37 C), temperature source Tympanic, resp. rate 18, height $RemoveBe'5\' 4"'[id number]$  (1.626 m), weight 190 lb 14.4 oz (86.6 kg), last menstrual period 09/29/2002, SpO2 98 %.  LABORATORY DATA: Lab Results  Component Value Date   WBC 7.2 01/16/2020   HGB 12.5 01/16/2020   HCT 37.5 01/16/2020   MCV 88.4 01/16/2020   PLT 263 01/16/2020      Chemistry      Component Value Date/Time   NA 138 01/16/2020 1437   K 3.4 (L) 01/16/2020 1437   CL 102 01/16/2020 1437   CO2 28 01/16/2020 1437   BUN 11 01/16/2020 1437   CREATININE 0.76 01/16/2020 1437      Component Value Date/Time   CALCIUM 8.6 (L) 01/16/2020 1437   ALKPHOS 63 01/16/2020 1437   AST 21 01/16/2020 1437   ALT 17 01/16/2020 1437   BILITOT 0.5 01/16/2020 1437       RADIOGRAPHIC STUDIES: DG Chest 2 View  Result Date: 01/02/2020 CLINICAL DATA:  Follow-up after thoracentesis. EXAM: CHEST - 2 VIEW COMPARISON:  Chest x-ray 12/24/2019, CT chest 12/18/2019 FINDINGS: The heart size and mediastinal contours are within normal limits. Biapical pleural/pulmonary scarring. Nodular like opacity at the left lateral base. Increased interstitial markings within the left lower lobe and lingula likely due to re-expansion edema of the lungs. No focal consolidation. No pulmonary edema. Interval decrease and almost complete resolution of a now trace left pleural effusion. No pneumothorax. No acute osseous abnormality. Multilevel degenerative changes of the spine. IMPRESSION: 1. Almost complete interval resolution of a now trace left pleural effusion status post thoracentesis. No pneumothorax. 2. Nodular like density at the left lateral base of unclear etiology. Finding could represent a pulmonary nodule. Recommend CT chest with intravenous contrast for further evaluation. These results will be called to the ordering clinician  or representative by the Radiologist Assistant, and communication documented in the PACS or [employer]. Electronically Signed   By: Iven Finn M.D.   On: 01/02/2020 15:38   CT Chest Wo Contrast  Result Date: 01/02/2020 CLINICAL DATA:  Follow-up pleural effusion.  Pain and cough. EXAM: CT CHEST WITHOUT CONTRAST TECHNIQUE: Multidetector CT imaging of the chest was performed following the standard protocol without IV contrast. COMPARISON:  Chest CT 12/18/2019 FINDINGS: Cardiovascular: The heart is normal in size for age. No pericardial effusion. Stable mild tortuosity and moderate atherosclerotic calcifications involving the thoracic aorta. Stable scattered three-vessel coronary artery calcifications. Mediastinum/Nodes: No mediastinal or hilar mass or adenopathy. Small scattered lymph nodes are stable. The esophagus is grossly normal. Lungs/Pleura: Interval resolution or evacuation of the left pleural fluid collection. There is moderate pleural nodularity with some areas of irregular pleural thickening. Could not exclude pleural nodules or pleural neoplasm. Recommend correlation with the pleural fluid that was drained. Patchy ground-glass opacity in the left lung is likely edema and or atelectasis. No discrete lung mass is identified. The right lung is clear. No pleural effusion or pleural nodules. No pulmonary lesions. Upper Abdomen: The upper abdomen is unremarkable. No worrisome hepatic or adrenal gland lesions without contrast. No upper abdominal adenopathy. Scattered vascular calcifications. Musculoskeletal: No breast masses, supraclavicular or axillary adenopathy. The thyroid gland appears normal. The bony  thorax is intact.  No worrisome bone lesions. IMPRESSION: 1. Interval resolution or evacuation of the left pleural fluid collection. 2. Moderate diffuse pleural nodularity with some areas of irregular pleural thickening. Findings suspicious for pleural neoplasm. Recommend correlation with the  pleural fluid that was drained. 3. Patchy ground-glass opacity in the left lung is likely edema and or atelectasis. No discrete lung mass is identified. 4. No mediastinal or hilar mass or adenopathy. 5. Stable atherosclerotic calcifications involving the thoracic aorta and coronary arteries. Aortic Atherosclerosis (ICD10-I70.0). Electronically Signed   By: Marijo Sanes M.D.   On: 01/02/2020 16:32   MR BRAIN W WO CONTRAST  Result Date: 01/23/2020 CLINICAL DATA:  Staging of non-small cell lung carcinoma. EXAM: MRI HEAD WITHOUT AND WITH CONTRAST TECHNIQUE: Multiplanar, multiecho pulse sequences of the brain and surrounding structures were obtained without and with intravenous contrast. CONTRAST:  51mL GADAVIST GADOBUTROL 1 MMOL/ML IV SOLN COMPARISON:  None. FINDINGS: Brain: Diffusion imaging does not show any acute or subacute infarction or other cause of restricted diffusion. Mild age related volume loss. The brain does not show a pattern of small-vessel disease. No cortical or large vessel territory infarction. No evidence of primary or metastatic mass lesion, hemorrhage, hydrocephalus or extra-axial collection. Vascular: Major vessels at the base of the brain show flow. Skull and upper cervical spine: Negative Sinuses/Orbits: Clear/normal Other: None IMPRESSION: Normal examination for age. No evidence of metastatic disease. Electronically Signed   By: Nelson Chimes M.D.   On: 01/23/2020 15:36   NM PET Image Initial (PI) Skull Base To Thigh  Result Date: 01/07/2020 CLINICAL DATA:  Initial treatment strategy for pulmonary nodule and pleural effusion. EXAM: NUCLEAR MEDICINE PET SKULL BASE TO THIGH TECHNIQUE: 9.8 mCi F-18 FDG was injected intravenously. Full-ring PET imaging was performed from the skull base to thigh after the radiotracer. CT data was obtained and used for attenuation correction and anatomic localization. Fasting blood glucose: 108 mg/dl COMPARISON:  CT chest 01/02/2020 FINDINGS: Mediastinal  blood pool activity: SUV max 3.0 Liver activity: SUV max NA NECK: Symmetric glottic activity, maximum SUV 7.3, likely physiologic. No specific CT abnormality in this vicinity. Incidental CT findings: Bilateral common carotid atherosclerotic calcification. CHEST: Hypermetabolic solid pleural nodules along the small left pleural effusion compatible with pleural malignancy. Along the left hemidiaphragm a nodule measuring about 1.8 by 1.3 cm has a maximum SUV of 8.6. Along the left posterior costophrenic angle, a hypermetabolic nodule measuring about 3.0 by 1.1 cm on image 108 of series 4 has a maximum SUV of 6.7. Multiple additional foci of pleural metabolic tumor identified. Moreover, in the superior segment left lower lobe there is some parenchymal nodularity there is hypermetabolic, including a 1.3 by 1.4 cm nodule on image 27 of series 8 with maximum SUV 9.6. A left infrahilar lymph node measuring 0.7 cm in short axis on image 73 of series 4 has a maximum SUV of 5.9. Incidental CT findings: Coronary, aortic arch, and branch vessel atherosclerotic vascular disease. Mild cardiomegaly. ABDOMEN/PELVIS: No significant abnormal hypermetabolic activity in this region. 1.0 cm left inguinal lymph node has a maximum SUV of 3.2, roughly similar to blood pool and probably incidental. Incidental CT findings: Aortoiliac atherosclerotic vascular disease. Sigmoid colon diverticulosis. SKELETON: No significant abnormal hypermetabolic activity in this region. Incidental CT findings: none IMPRESSION: 1. Hypermetabolic 1.4 cm left lower lobe pulmonary nodule, with hypermetabolic scattered pleural nodules and small left pleural effusion. Small hypermetabolic left infrahilar lymph node. Appearance compatible with active malignancy. No findings of spread  to the abdomen/pelvis, neck, or skeleton. 2. Other imaging findings of potential clinical significance: Aortic Atherosclerosis (ICD10-I70.0). Coronary atherosclerosis. Mild cardiomegaly.  Sigmoid colon diverticulosis. Electronically Signed   By: Van Clines M.D.   On: 01/07/2020 12:12    ASSESSMENT AND PLAN: This is a very pleasant 74 years old white female recently diagnosed with a stage IV (T1b, N2, M1 a) non-small cell lung cancer, adenocarcinoma with no actionable mutations based on the molecular studies by [employer]. I had a lengthy discussion with the patient and her supporting friends today about her current condition and treatment options. I discussed with the patient several options for management of her condition including repeat biopsy from one of the pleural-based masses for additional molecular studies by [facility] [employer] as well as PD-L1 expression to check for any possible mutation and also for the level of the PD-L1 expression for consideration of treatment with immunotherapy as single agent. I also gave her the option of palliative care and hospice referral versus proceeding with systemic treatment with carboplatin for AUC of 5, Alimta 500 mg/M2 and Keytruda 200 mg IV every 3 weeks without having to wait for the new biopsy. After discussion of all the options the patient agreed to proceed with the CT-guided core biopsy for additional tissue material for molecular studies and PD-L1 expression. I will see the patient back for follow-up visit in around 3 weeks for evaluation and more detailed discussion of her treatment options based on the molecular studies as well as the PD-L1 expression. She was advised to call immediately if she has any other concerning symptoms in the interval. The patient voices understanding of current disease status and treatment options and is in agreement with the current care plan.  All questions were answered. The patient knows to call the clinic with any problems, questions or concerns. We can certainly see the patient much sooner if necessary.  The total time spent in the appointment was 50 minutes.  Disclaimer: This note was dictated  with voice recognition software. Similar sounding words can inadvertently be transcribed and may not be corrected upon review.

## 2020-01-31 ENCOUNTER — Telehealth: Payer: Self-pay

## 2020-01-31 ENCOUNTER — Encounter: Payer: Self-pay | Admitting: Internal Medicine

## 2020-01-31 ENCOUNTER — Ambulatory Visit: Payer: Medicare PPO | Attending: Internal Medicine

## 2020-01-31 DIAGNOSIS — Z23 Encounter for immunization: Secondary | ICD-10-CM

## 2020-01-31 NOTE — Progress Notes (Signed)
   Covid-19 Vaccination Clinic  Name:  Ayvah Caroll    MRN: [id number] DOB: [birth year]  01/31/2020  Ms. Tisdell was observed post Covid-19 immunization for 15 minutes without incident. She was provided with Vaccine Information Sheet and instruction to access the V-Safe system.   Ms. Trower was instructed to call 911 with any severe reactions post vaccine: Marland Kitchen Difficulty breathing  . Swelling of face and throat  . A fast heartbeat  . A bad rash all over body  . Dizziness and weakness   Immunizations Administered    No immunizations on file.    Ellyce Lafevers T Brooks Sailors

## 2020-01-31 NOTE — Telephone Encounter (Signed)
Pt called wanting to know if she has to be put into deep sleep or twilight sleep for her CT bx because she was told she needed someone with her for the appt and for up to 24hrs after. I advised pt it is always best to be prepared for either scenario and this might depend on what is seen or needed the day of her appt. Pt expressed understanding of this.  Pt also wants to know if she can be referred for counseling services. I advised the pt I will notify the provider and an LCSW as well. Pt then advised she will be out of town all next week and if they can call her the week of 02/10/20, it would be better.

## 2020-02-04 ENCOUNTER — Telehealth: Payer: Self-pay | Admitting: Internal Medicine

## 2020-02-04 NOTE — Telephone Encounter (Signed)
Called to schedule f/u appt per [facility]. Patient wants to wait to schedule. Will call back

## 2020-02-10 ENCOUNTER — Ambulatory Visit (HOSPITAL_COMMUNITY): Admission: RE | Admit: 2020-02-10 | Payer: Medicare PPO | Source: Ambulatory Visit

## 2020-02-10 ENCOUNTER — Other Ambulatory Visit: Payer: Self-pay | Admitting: Radiology

## 2020-02-11 ENCOUNTER — Other Ambulatory Visit (HOSPITAL_COMMUNITY)
Admission: RE | Admit: 2020-02-11 | Discharge: 2020-02-11 | Disposition: A | Discharge status: Home / Self Care | Payer: Medicare PPO | Source: Ambulatory Visit | Attending: Internal Medicine | Admitting: Internal Medicine

## 2020-02-11 DIAGNOSIS — Z01812 Encounter for preprocedural laboratory examination: Secondary | ICD-10-CM | POA: Diagnosis not present

## 2020-02-11 DIAGNOSIS — Z20822 Contact with and (suspected) exposure to covid-19: Secondary | ICD-10-CM | POA: Insufficient documentation

## 2020-02-11 LAB — SARS CORONAVIRUS 2 (TAT 6-24 HRS): SARS Coronavirus 2: NEGATIVE

## 2020-02-12 ENCOUNTER — Other Ambulatory Visit: Payer: Self-pay | Admitting: Radiology

## 2020-02-13 ENCOUNTER — Telehealth: Payer: Self-pay | Admitting: Internal Medicine

## 2020-02-13 ENCOUNTER — Ambulatory Visit (HOSPITAL_COMMUNITY)
Admission: RE | Admit: 2020-02-13 | Discharge: 2020-02-13 | Disposition: A | Discharge status: Home / Self Care | Payer: Medicare PPO | Source: Ambulatory Visit | Attending: Internal Medicine | Admitting: Internal Medicine

## 2020-02-13 ENCOUNTER — Other Ambulatory Visit: Payer: Self-pay

## 2020-02-13 ENCOUNTER — Encounter (HOSPITAL_COMMUNITY): Payer: Self-pay

## 2020-02-13 DIAGNOSIS — C3491 Malignant neoplasm of unspecified part of right bronchus or lung: Secondary | ICD-10-CM | POA: Diagnosis not present

## 2020-02-13 DIAGNOSIS — Z87891 Personal history of nicotine dependence: Secondary | ICD-10-CM | POA: Insufficient documentation

## 2020-02-13 DIAGNOSIS — C3490 Malignant neoplasm of unspecified part of unspecified bronchus or lung: Secondary | ICD-10-CM | POA: Diagnosis not present

## 2020-02-13 DIAGNOSIS — C782 Secondary malignant neoplasm of pleura: Secondary | ICD-10-CM | POA: Insufficient documentation

## 2020-02-13 DIAGNOSIS — C384 Malignant neoplasm of pleura: Secondary | ICD-10-CM | POA: Diagnosis not present

## 2020-02-13 DIAGNOSIS — J948 Other specified pleural conditions: Secondary | ICD-10-CM | POA: Diagnosis not present

## 2020-02-13 DIAGNOSIS — C3492 Malignant neoplasm of unspecified part of left bronchus or lung: Secondary | ICD-10-CM | POA: Insufficient documentation

## 2020-02-13 LAB — BASIC METABOLIC PANEL
Anion gap: 9 (ref 5–15)
BUN: 8 mg/dL (ref 8–23)
CO2: 29 mmol/L (ref 22–32)
Calcium: 9.2 mg/dL (ref 8.9–10.3)
Chloride: 100 mmol/L (ref 98–111)
Creatinine, Ser: 0.62 mg/dL (ref 0.44–1.00)
GFR, Estimated: >60 mL/min (ref >60)
Glucose, Bld: 119 mg/dL — ABNORMAL HIGH (ref 70–99)
Potassium: 4.7 mmol/L (ref 3.5–5.1)
Sodium: 138 mmol/L (ref 135–145)

## 2020-02-13 LAB — PROTIME-INR
INR: 0.9 (ref 0.8–1.2)
Prothrombin Time: 12.2 seconds (ref 11.4–15.2)

## 2020-02-13 MED ORDER — MIDAZOLAM HCL 2 MG/2ML IJ SOLN
INTRAMUSCULAR | Status: AC
  Filled 2020-02-13: qty 2

## 2020-02-13 MED ORDER — FENTANYL CITRATE (PF) 100 MCG/2ML IJ SOLN
INTRAMUSCULAR | Status: AC | PRN
Start: 2020-02-13 — End: 2020-02-13
  Administered 2020-02-13: 50 ug via INTRAVENOUS

## 2020-02-13 MED ORDER — SODIUM CHLORIDE 0.9 % IV SOLN: INTRAVENOUS | Status: DC

## 2020-02-13 MED ORDER — MIDAZOLAM HCL 2 MG/2ML IJ SOLN
INTRAMUSCULAR | Status: AC | PRN
  Administered 2020-02-13: 1 mg via INTRAVENOUS

## 2020-02-13 MED ORDER — FENTANYL CITRATE (PF) 100 MCG/2ML IJ SOLN
INTRAMUSCULAR | Status: AC
  Filled 2020-02-13: qty 2

## 2020-02-13 MED ORDER — SODIUM CHLORIDE 0.9 % IV SOLN
INTRAVENOUS | Status: AC | PRN
  Administered 2020-02-13: 250 mL via INTRAVENOUS

## 2020-02-13 NOTE — Discharge Instructions (Addendum)
Lung Biopsy, Care After This sheet gives you information about how to care for yourself after your procedure. Your health care provider may also give you more specific instructions depending on the type of biopsy you had. If you have problems or questions, contact your health care provider. What can I expect after the procedure? After the procedure, it is common to have:  A cough.  A sore throat.  Pain where a needle, bronchoscope, or incision was used to collect a biopsy sample (biopsy site). Follow these instructions at home: Medicines  Take over-the-counter and prescription medicines only as told by your health care provider.  Do not drink alcohol if your health care provider tells you not to drink.  Ask your health care provider if the medicine prescribed to you: ? Requires you to avoid driving or using heavy machinery. ? Can cause constipation. You may need to take these actions to prevent or treat constipation:  Drink enough fluid to keep your urine pale yellow.  Take over-the-counter or prescription medicines.  Eat foods that are high in fiber, such as beans, whole grains, and fresh fruits and vegetables.  Limit foods that are high in fat and processed sugars, such as fried or sweet foods.  Do not drive for 24 hours if you were given a sedative. Biopsy site care   Follow instructions from your health care provider about how to take care of your biopsy site. Make sure you: ? Wash your hands with soap and water before and after you change your bandage (dressing). If soap and water are not available, use hand sanitizer. ? Change your dressing as told by your health care provider. ? Leave stitches (sutures), skin glue, or adhesive strips in place. These skin closures may need to stay in place for 2 weeks or longer. If adhesive strip edges start to loosen and curl up, you may trim the loose edges. Do not remove adhesive strips completely unless your health care provider tells  you to do that.  Do not take baths, swim, or use a hot tub until your health care provider approves. Ask your health care provider if you may take showers. You may only be allowed to take sponge baths.  Check your biopsy site every day for signs of infection. Check for: ? Redness, swelling, or more pain. ? Fluid or blood. ? Warmth. ? Pus or a bad smell. General instructions  Return to your normal activities as told by your health care provider. Ask your health care provider what activities are safe for you.  It is up to you to get the results of your procedure. Ask your health care provider, or the department that is doing the procedure, when your results will be ready.  Keep all follow-up visits as told by your health care provider. This is important. Contact a health care provider if:  You have a fever.  You have redness, swelling, or more pain around your biopsy site.  You have fluid or blood coming from your biopsy site.  Your biopsy site feels warm to the touch.  You have pus or a bad smell coming from your biopsy site.  You have pain that does not get better with medicine. Get help right away if:  You cough up blood.  You have trouble breathing.  You have chest pain.  You lose consciousness. Summary  After the procedure, it is common to have a sore throat and a cough.  Return to your normal activities as told by your  health care provider. Ask your health care provider what activities are safe for you.  Take over-the-counter and prescription medicines only as told by your health care provider.  Report any unusual symptoms to your health care provider. This information is not intended to replace advice given to you by your health care provider. Make sure you discuss any questions you have with your health care provider. Document Revised: 03/21/2018 Document Reviewed: 03/15/2016 Elsevier Patient Education  [place]. Moderate Conscious Sedation,  Adult Sedation is the use of medicines to promote relaxation and relieve discomfort and anxiety. Moderate conscious sedation is a type of sedation. Under moderate conscious sedation, you are less alert than normal, but you are still able to respond to instructions, touch, or both. Moderate conscious sedation is used during short medical and dental procedures. It is milder than deep sedation, which is a type of sedation under which you cannot be easily woken up. It is also milder than general anesthesia, which is the use of medicines to make you unconscious. Moderate conscious sedation allows you to return to your regular activities sooner. Tell a health care provider about:  Any allergies you have.  All medicines you are taking, including vitamins, herbs, eye drops, creams, and over-the-counter medicines.  Use of steroids (by mouth or creams).  Any problems you or family members have had with sedatives and anesthetic medicines.  Any blood disorders you have.  Any surgeries you have had.  Any medical conditions you have, such as sleep apnea.  Whether you are pregnant or may be pregnant.  Any use of cigarettes, alcohol, marijuana, or street drugs. What are the risks? Generally, this is a safe procedure. However, problems may occur, including:  Getting too much medicine (oversedation).  Nausea.  Allergic reaction to medicines.  Trouble breathing. If this happens, a breathing tube may be used to help with breathing. It will be removed when you are awake and breathing on your own.  Heart trouble.  Lung trouble. What happens before the procedure? Staying hydrated Follow instructions from your health care provider about hydration, which may include:  Up to 2 hours before the procedure - you may continue to drink clear liquids, such as water, clear fruit juice, black coffee, and plain tea. Eating and drinking restrictions Follow instructions from your health care provider about  eating and drinking, which may include:  8 hours before the procedure - stop eating heavy meals or foods such as meat, fried foods, or fatty foods.  6 hours before the procedure - stop eating light meals or foods, such as toast or cereal.  6 hours before the procedure - stop drinking milk or drinks that contain milk.  2 hours before the procedure - stop drinking clear liquids. Medicine Ask your health care provider about:  Changing or stopping your regular medicines. This is especially important if you are taking diabetes medicines or blood thinners.  Taking medicines such as aspirin and ibuprofen. These medicines can thin your blood. Do not take these medicines before your procedure if your health care provider instructs you not to.  Tests and exams  You will have a physical exam.  You may have blood tests done to show: ? How well your kidneys and liver are working. ? How well your blood can clot. General instructions  Plan to have someone take you home from the hospital or clinic.  If you will be going home right after the procedure, plan to have someone with you for 24 hours.  What happens during the procedure?  An IV tube will be inserted into one of your veins.  Medicine to help you relax (sedative) will be given through the IV tube.  The medical or dental procedure will be performed. What happens after the procedure?  Your blood pressure, heart rate, breathing rate, and blood oxygen level will be monitored often until the medicines you were given have worn off.  Do not drive for 24 hours. This information is not intended to replace advice given to you by your health care provider. Make sure you discuss any questions you have with your health care provider. Document Revised: 01/27/2017 Document Reviewed: 06/06/2015 Elsevier Patient Education  2020 [employer].

## 2020-02-13 NOTE — Sedation Documentation (Signed)
Attempt x 1 made to call report to Short Stay. No RN's available to take report at this time.

## 2020-02-13 NOTE — Telephone Encounter (Signed)
Scheduled appt per 12/16 sch msg - pt is aware of appt date and time

## 2020-02-13 NOTE — H&P (Signed)
Chief Complaint: Lung cancer  Referring Physician(s): Curt Bears  Supervising Physician: Corrie Mckusick  Patient Status: [facility] - Out-pt  History of Present Illness: Cindy [name] is a 74 y.o. female with non-small cell lung cancer favoring adenocarcinoma diagnosed in October 2021.  She is known to our service. She underwent left thoracentesis by Ascencion Dike on  12/19/19.  She is here today for CT guided left posterior pleural mass biopsy. Best location is series 4, image 79 on CT portion of recent PET/CT (01/07/20).    Dr. Worthy Flank note reads =  .....repeat biopsy from one of the pleural based masses for additional molecular studies by foundation [facility] as well as PD-L1 expression to check for any possible mutation and also for the level of the PD-L1 expression for consideration of treatment with immunotherapy as single agent.  She is NPO. No nausea/vomiting. No Fever/chills. ROS negative.   Past Medical History:  Diagnosis Date  . Anxiety   . Depression   . Hyperlipidemia   . Hypertension     Past Surgical History:  Procedure Laterality Date  . DENTAL SURGERY  2012 - 2013   implants  . IR THORACENTESIS ASP PLEURAL SPACE W/IMG GUIDE  12/19/2019  . WISDOM TOOTH EXTRACTION  20    Allergies: Escitalopram, Povidone iodine, and Procaine  Medications: Prior to Admission medications   Medication Sig Start Date End Date Taking? Authorizing Provider  ALPRAZolam Duanne Moron) 0.25 MG tablet Take 0.25 mg by mouth daily as needed for anxiety. 07/30/13  Yes [provider]  Ascorbic Acid (VITAMIN C PO) Take 1 tablet by mouth daily.   Yes [provider]  aspirin 81 MG tablet Take 81 mg by mouth daily.   Yes [provider]  b complex vitamins capsule Take 1 capsule by mouth 2 (two) times a week.   Yes [provider]  calcium carbonate (TUMS - DOSED IN MG ELEMENTAL CALCIUM) 500 MG chewable tablet Chew 500-1,000 mg by mouth daily as needed  for indigestion or heartburn.   Yes [provider]  hydrochlorothiazide (HYDRODIURIL) 25 MG tablet Take 25 mg by mouth daily.   Yes [provider]  ibuprofen (ADVIL) 200 MG tablet Take 200 mg by mouth every 6 (six) hours as needed for moderate pain.   Yes [provider]  metoprolol succinate (TOPROL-XL) 50 MG 24 hr tablet Take 50 mg by mouth daily. Take with or immediately following a meal.   Yes [provider]  Multiple Minerals-Vitamins (CALCIUM-MAGNESIUM-ZINC-D3 PO) Take 1 tablet by mouth 2 (two) times a week.   Yes [provider]  Multiple Vitamin (MULTIVITAMIN) tablet Take 1 tablet by mouth 3 (three) times a week.   Yes [provider]  naproxen sodium (ALEVE) 220 MG tablet Take 220 mg by mouth daily as needed (pain).   Yes [provider]  Polyvinyl Alcohol-Povidone (REFRESH OP) Place 1 drop into both eyes daily as needed (dry eyes).   Yes [provider]  simvastatin (ZOCOR) 20 MG tablet Take 20 mg by mouth daily. 12/18/19  Yes [provider]  VITAMIN D PO Take 1 capsule by mouth 3 (three) times a week.   Yes [provider]  VITAMIN E PO Take 1 capsule by mouth 2 (two) times a week.   Yes [provider]     Family History  Problem Relation Age of Onset  . Dementia Mother   . COPD Mother   . Lung cancer Maternal Grandmother   .  Cirrhosis Brother     Social History   Socioeconomic History  . Marital status: Widowed    Spouse name: Not on file  . Number of children: 0  . Years of education: Not on file  . Highest education level: Not on file  Occupational History    Employer: [employer]  Tobacco Use  . Smoking status: Former Research scientist (life sciences)  . Smokeless tobacco: Never Used  Substance and Sexual Activity  . Alcohol use: Yes    Alcohol/week: 10.0 standard drinks    Types: 10 Standard drinks or equivalent per week  . Drug use: No  . Sexual activity: Never    Birth  control/protection: Abstinence, Post-menopausal  Other Topics Concern  . Not on file  Social History Narrative  . Not on file   Social Determinants of Health   Financial Resource Strain: Not on file  Food Insecurity: Not on file  Transportation Needs: Not on file  Physical Activity: Not on file  Stress: Not on file  Social Connections: Not on file     Review of Systems: A 12 point ROS discussed and pertinent positives are indicated in the HPI above.  All other systems are negative.  Review of Systems  Vital Signs: BP (!) 152/68   Pulse 71   Temp 98 F (36.7 C) (Oral)   Resp 14   Ht $R'5\' 4"'FC$  (1.626 m)   Wt 86.2 kg   LMP 09/29/2002   SpO2 97%   BMI 32.61 kg/m   Physical Exam Vitals reviewed.  Constitutional:      Appearance: Normal appearance.  HENT:     Head: Normocephalic and atraumatic.  Eyes:     Extraocular Movements: Extraocular movements intact.  Cardiovascular:     Rate and Rhythm: Normal rate and regular rhythm.  Pulmonary:     Effort: Pulmonary effort is normal. No respiratory distress.     Breath sounds: Normal breath sounds.  Abdominal:     General: There is no distension.     Palpations: Abdomen is soft.     Tenderness: There is no abdominal tenderness.  Musculoskeletal:        General: Normal range of motion.  Skin:    General: Skin is warm and dry.  Neurological:     General: No focal deficit present.     Mental Status: She is alert and oriented to person, place, and time.  Psychiatric:        Mood and Affect: Mood normal.        Behavior: Behavior normal.        Thought Content: Thought content normal.        Judgment: Judgment normal.     Imaging: MR BRAIN W WO CONTRAST  Result Date: 01/23/2020 CLINICAL DATA:  Staging of non-small cell lung carcinoma. EXAM: MRI HEAD WITHOUT AND WITH CONTRAST TECHNIQUE: Multiplanar, multiecho pulse sequences of the brain and surrounding structures were obtained without and with intravenous contrast.  CONTRAST:  19mL GADAVIST GADOBUTROL 1 MMOL/ML IV SOLN COMPARISON:  None. FINDINGS: Brain: Diffusion imaging does not show any acute or subacute infarction or other cause of restricted diffusion. Mild age related volume loss. The brain does not show a pattern of small-vessel disease. No cortical or large vessel territory infarction. No evidence of primary or metastatic mass lesion, hemorrhage, hydrocephalus or extra-axial collection. Vascular: Major vessels at the base of the brain show flow. Skull and upper cervical spine: Negative Sinuses/Orbits: Clear/normal Other: None IMPRESSION: Normal examination for age. No evidence of  metastatic disease. Electronically Signed   By: Nelson Chimes M.D.   On: 01/23/2020 15:36    Labs:  CBC: Recent Labs    01/16/20 1437  WBC 7.2  HGB 12.5  HCT 37.5  PLT 263    COAGS: No results for input(s): INR, APTT in the last 8760 hours.  BMP: Recent Labs    01/16/20 1437  NA 138  K 3.4*  CL 102  CO2 28  GLUCOSE 126*  BUN 11  CALCIUM 8.6*  CREATININE 0.76  GFRNONAA >60    LIVER FUNCTION TESTS: Recent Labs    01/16/20 1437  BILITOT 0.5  AST 21  ALT 17  ALKPHOS 63  PROT 6.7  ALBUMIN 3.6    TUMOR MARKERS: No results for input(s): AFPTM, CEA, CA199, CHROMGRNA in the last 8760 hours.  Assessment and Plan:   Non-small cell lung cancer favoring adenocarcinoma diagnosed in October 2021.  Will proceed with image guided left posterior pleural mass biopsy by Dr. Earleen Newport for additional molecular studies.  Risks and benefits of CT guided lung nodule biopsy was discussed with the patient including, but not limited to bleeding, hemoptysis, respiratory failure requiring intubation, infection, pneumothorax requiring chest tube placement, stroke from air embolism or even death.  All of the patient's questions were answered and the patient is agreeable to proceed.  Consent signed and in chart.  Thank you for this interesting consult.  I greatly enjoyed  meeting Iola Turri and look forward to participating in their care.  A copy of this report was sent to the requesting provider on this date.  Electronically Signed: Murrell Redden, PA-C   02/13/2020, 8:57 AM      I spent a total of   25 Minutes in face to face in clinical consultation, greater than 50% of which was counseling/coordinating care for pleural mass biopsy.

## 2020-02-13 NOTE — Procedures (Signed)
Interventional Radiology Procedure Note  Procedure: CT guided biopsy of right posterior pleural mass Complications: None Recommendations: - Bedrest 1 hr  - Advance diet - Dc home 1 hr  Signed,  Corrie Mckusick, DO

## 2020-02-14 LAB — SURGICAL PATHOLOGY

## 2020-02-17 ENCOUNTER — Telehealth: Payer: Self-pay | Admitting: Pulmonary Disease

## 2020-02-17 ENCOUNTER — Ambulatory Visit (INDEPENDENT_AMBULATORY_CARE_PROVIDER_SITE_OTHER): Payer: Medicare PPO

## 2020-02-17 DIAGNOSIS — J910 Malignant pleural effusion: Secondary | ICD-10-CM

## 2020-02-17 DIAGNOSIS — J90 Pleural effusion, not elsewhere classified: Secondary | ICD-10-CM | POA: Diagnosis not present

## 2020-02-17 DIAGNOSIS — R0602 Shortness of breath: Secondary | ICD-10-CM

## 2020-02-17 NOTE — Telephone Encounter (Signed)
Called and spoke with patient to let her know that Dr. Silas Flood would like her to come in for a chest x-ray to see if there is anything going on for her symptoms. Patient expressed understanding. I have let front staff and x-ray know that she will be coming. Nothing further needed at this time.

## 2020-02-17 NOTE — Telephone Encounter (Signed)
Spoke with the pt  She had bronch with Dr Valeta Harms on 02/13/20  She states since then has developed a cough- occ prod with clear sputum and increased SOB  She states she is feeling SOB all of the time with or without exertion  She denies any f/c/s, aches, CP, chest tightness, wheezing  Dr Valeta Harms scheduled off today  Please advise, thanks!

## 2020-02-17 NOTE — Telephone Encounter (Signed)
To clarify, she had a CT guided transthoracic needle biopsy on 02/13/20. Would advise she come for chest xray today - high rate of pneumothorax with transcutaneous needle biopsy. Would allow [place] to eval for other causes of her symptoms. Thanks!

## 2020-02-17 NOTE — Progress Notes (Signed)
Dr. Silas Flood has spoke with the patient.  Nothing further needed.

## 2020-02-17 NOTE — Progress Notes (Signed)
CXR shows loculated L effusion, increased from post-thora CXR 12/2019 but better than CXR prior to thora. No PTX s/p percutaneous biopsy last week. Suspect effusion is reason for worsening DOE. No fever, purulent sputum. Consider repeat thoracentesis in coming weeks. She wishes to get through holidays but was advised to contact us if she develops fever, worsening or purulent sputum production, worsening SOB/DOE. Has appt with oncologist 03/01/2020. Can re-evaluate at that time. All of this relayed to patient waiting in clinic for results. Will route as FYI to [employer]. Will route to primary pulmonologist for review.

## 2020-02-27 ENCOUNTER — Encounter (HOSPITAL_COMMUNITY): Payer: Self-pay

## 2020-03-02 ENCOUNTER — Inpatient Hospital Stay: Payer: Medicare PPO | Attending: Internal Medicine | Admitting: Internal Medicine

## 2020-03-02 ENCOUNTER — Inpatient Hospital Stay: Payer: Medicare PPO

## 2020-03-02 ENCOUNTER — Other Ambulatory Visit: Payer: Self-pay

## 2020-03-02 VITALS — BP 157/76 | HR 75 | Temp 97.8°F | Resp 16 | Ht 64.0 in | Wt 193.5 lb

## 2020-03-02 DIAGNOSIS — Z5112 Encounter for antineoplastic immunotherapy: Secondary | ICD-10-CM | POA: Diagnosis not present

## 2020-03-02 DIAGNOSIS — C3492 Malignant neoplasm of unspecified part of left bronchus or lung: Secondary | ICD-10-CM | POA: Insufficient documentation

## 2020-03-02 DIAGNOSIS — Z79899 Other long term (current) drug therapy: Secondary | ICD-10-CM | POA: Insufficient documentation

## 2020-03-02 DIAGNOSIS — Z5111 Encounter for antineoplastic chemotherapy: Secondary | ICD-10-CM | POA: Diagnosis not present

## 2020-03-02 DIAGNOSIS — C3491 Malignant neoplasm of unspecified part of right bronchus or lung: Secondary | ICD-10-CM | POA: Diagnosis not present

## 2020-03-02 DIAGNOSIS — R079 Chest pain, unspecified: Secondary | ICD-10-CM | POA: Diagnosis not present

## 2020-03-02 LAB — CBC WITH DIFFERENTIAL (CANCER CENTER ONLY)
Abs Immature Granulocytes: 0.01 10*3/uL (ref 0.00–0.07)
Basophils Absolute: 0 10*3/uL (ref 0.0–0.1)
Basophils Relative: 1 %
Eosinophils Absolute: 0.1 10*3/uL (ref 0.0–0.5)
Eosinophils Relative: 1 %
HCT: 38.8 % (ref 36.0–46.0)
Hemoglobin: 13.1 g/dL (ref 12.0–15.0)
Immature Granulocytes: 0 %
Lymphocytes Relative: 16 %
Lymphs Abs: 1.2 10*3/uL (ref 0.7–4.0)
MCH: 29 pg (ref 26.0–34.0)
MCHC: 33.8 g/dL (ref 30.0–36.0)
MCV: 85.8 fL (ref 80.0–100.0)
Monocytes Absolute: 0.8 10*3/uL (ref 0.1–1.0)
Monocytes Relative: 9 %
Neutro Abs: 5.9 10*3/uL (ref 1.7–7.7)
Neutrophils Relative %: 73 %
Platelet Count: 292 10*3/uL (ref 150–400)
RBC: 4.52 MIL/uL (ref 3.87–5.11)
RDW: 12.7 % (ref 11.5–15.5)
WBC Count: 8 10*3/uL (ref 4.0–10.5)
nRBC: 0 % (ref 0.0–0.2)

## 2020-03-02 LAB — CMP (CANCER CENTER ONLY)
ALT: 17 U/L (ref 0–44)
AST: 20 U/L (ref 15–41)
Albumin: 3.8 g/dL (ref 3.5–5.0)
Alkaline Phosphatase: 73 U/L (ref 38–126)
Anion gap: 7 (ref 5–15)
BUN: 10 mg/dL (ref 8–23)
CO2: 31 mmol/L (ref 22–32)
Calcium: 9.3 mg/dL (ref 8.9–10.3)
Chloride: 100 mmol/L (ref 98–111)
Creatinine: 0.77 mg/dL (ref 0.44–1.00)
GFR, Estimated: >60 mL/min (ref >60)
Glucose, Bld: 105 mg/dL — ABNORMAL HIGH (ref 70–99)
Potassium: 3.8 mmol/L (ref 3.5–5.1)
Sodium: 138 mmol/L (ref 135–145)
Total Bilirubin: 0.4 mg/dL (ref 0.3–1.2)
Total Protein: 7.6 g/dL (ref 6.5–8.1)

## 2020-03-02 MED ORDER — CYANOCOBALAMIN 1000 MCG/ML IJ SOLN
1000.0000 ug | Freq: Once | INTRAMUSCULAR | Status: AC
  Administered 2020-03-02: 1000 ug via INTRAMUSCULAR

## 2020-03-02 MED ORDER — CYANOCOBALAMIN 1000 MCG/ML IJ SOLN
INTRAMUSCULAR | Status: AC
  Filled 2020-03-02: qty 1

## 2020-03-02 MED ORDER — PROCHLORPERAZINE MALEATE 10 MG PO TABS: 10.0000 mg | ORAL_TABLET | Freq: Four times a day (QID) | ORAL | 0 refills | Status: DC | PRN

## 2020-03-02 MED ORDER — FOLIC ACID 1 MG PO TABS: 1.0000 mg | ORAL_TABLET | Freq: Every day | ORAL | 4 refills | Status: DC

## 2020-03-02 NOTE — Progress Notes (Signed)
[facility] [phone]   [phone]  OFFICE PROGRESS NOTE  [name], MD [address] [place]  [id number]  DIAGNOSIS: stage IV (T1b, N1, M1 a) non-small cell lung cancer favoring adenocarcinoma diagnosed in October 2021.  The pathologic immunohistochemical stains suggestive of upper GI primary but there is no finding on the PET scan or imaging study to support this possibility.  DETECTED ALTERATION(S) / BIOMARKER(S) % CFDNA OR AMPLIFICATION ASSOCIATED FDA-APPROVED THERAPIES CLINICAL TRIAL AVAILABILITY [id number] 0.4% None   PDL1 Expression 0%  PRIOR THERAPY: None  CURRENT THERAPY: Systemic chemotherapy with carboplatin for AUC of 5, Alimta 500 mg/M2 and Keytruda 200 mg IV every 3 weeks.  First dose March 18, 2019.  INTERVAL HISTORY: Cindy [name] 75 y.o. female returns to the clinic today for follow-up visit.  Her friend Shauna Hugh was available by FaceTime during the visit.  The patient is feeling fine today with no concerning complaints except for intermittent low back pain.  She denied having any current shortness of breath, cough or hemoptysis.  She denied having any recent weight loss or night sweats.  She has no nausea, vomiting, diarrhea or constipation.  She denied having any headache or visual changes.  She had biopsy of one of the pleural-based left lung nodule by interventional radiology and the final pathology was consistent with adenocarcinoma.  The biopsy was sent for molecular studies and PD-L1 expression.  The molecular studies are still pending but PD-L1 expression was reported to be 0%.  The patient is here today for evaluation and discussion of her treatment options.  MEDICAL HISTORY: Past Medical History:  Diagnosis Date  . Anxiety   . Depression   . Hyperlipidemia   . Hypertension     ALLERGIES:  is allergic to escitalopram, povidone iodine, and procaine.  MEDICATIONS:  Current Outpatient  Medications  Medication Sig Dispense Refill  . ALPRAZolam (XANAX) 0.25 MG tablet Take 0.25 mg by mouth daily as needed for anxiety.    . Ascorbic Acid (VITAMIN C PO) Take 1 tablet by mouth daily.    Marland Kitchen aspirin 81 MG tablet Take 81 mg by mouth daily.    Marland Kitchen b complex vitamins capsule Take 1 capsule by mouth 2 (two) times a week.    . calcium carbonate (TUMS - DOSED IN MG ELEMENTAL CALCIUM) 500 MG chewable tablet Chew 500-1,000 mg by mouth daily as needed for indigestion or heartburn.    . hydrochlorothiazide (HYDRODIURIL) 25 MG tablet Take 25 mg by mouth daily.    Marland Kitchen ibuprofen (ADVIL) 200 MG tablet Take 200 mg by mouth every 6 (six) hours as needed for moderate pain.    . metoprolol succinate (TOPROL-XL) 50 MG 24 hr tablet Take 50 mg by mouth daily. Take with or immediately following a meal.    . Multiple Minerals-Vitamins (CALCIUM-MAGNESIUM-ZINC-D3 PO) Take 1 tablet by mouth 2 (two) times a week.    . Multiple Vitamin (MULTIVITAMIN) tablet Take 1 tablet by mouth 3 (three) times a week.    . naproxen sodium (ALEVE) 220 MG tablet Take 220 mg by mouth daily as needed (pain).    . Polyvinyl Alcohol-Povidone (REFRESH OP) Place 1 drop into both eyes daily as needed (dry eyes).    . simvastatin (ZOCOR) 20 MG tablet Take 20 mg by mouth daily.    Marland Kitchen VITAMIN D PO Take 1 capsule by mouth 3 (three) times a week.    Marland Kitchen VITAMIN E PO Take 1 capsule  by mouth 2 (two) times a week.     No current facility-administered medications for this visit.    SURGICAL HISTORY:  Past Surgical History:  Procedure Laterality Date  . DENTAL SURGERY  2012 - 2013   implants  . IR THORACENTESIS ASP PLEURAL SPACE W/IMG GUIDE  12/19/2019  . WISDOM TOOTH EXTRACTION  20    REVIEW OF SYSTEMS:  Constitutional: positive for fatigue Eyes: negative Ears, nose, mouth, throat, and face: negative Respiratory: positive for pleurisy/chest pain Cardiovascular: negative Gastrointestinal:  negative Genitourinary:negative Integument/breast: negative Hematologic/lymphatic: negative Musculoskeletal:positive for back pain Neurological: negative Behavioral/Psych: negative Endocrine: negative Allergic/Immunologic: negative   PHYSICAL EXAMINATION: General appearance: alert, cooperative, fatigued and no distress Head: Normocephalic, without obvious abnormality, atraumatic Neck: no adenopathy, no JVD, supple, symmetrical, trachea midline and thyroid not enlarged, symmetric, no tenderness/mass/nodules Lymph nodes: Cervical, supraclavicular, and axillary nodes normal. Resp: clear to auscultation bilaterally Back: symmetric, no curvature. ROM normal. No CVA tenderness. Cardio: regular rate and rhythm, S1, S2 normal, no murmur, click, rub or gallop GI: soft, non-tender; bowel sounds normal; no masses,  no organomegaly Extremities: extremities normal, atraumatic, no cyanosis or edema Neurologic: Alert and oriented X 3, normal strength and tone. Normal symmetric reflexes. Normal coordination and gait  ECOG PERFORMANCE STATUS: 1 - Symptomatic but completely ambulatory  Blood pressure (!) 157/76, pulse 75, temperature 97.8 F (36.6 C), temperature source Tympanic, resp. rate 16, height $RemoveBe'5\' 4"'AjExhLSWu$  (1.626 m), weight 193 lb 8 oz (87.8 kg), last menstrual period 09/29/2002, SpO2 99 %.  LABORATORY DATA: Lab Results  Component Value Date   WBC 8.0 03/02/2020   HGB 13.1 03/02/2020   HCT 38.8 03/02/2020   MCV 85.8 03/02/2020   PLT 292 03/02/2020      Chemistry      Component Value Date/Time   NA 138 02/13/2020 0930   K 4.7 02/13/2020 0930   CL 100 02/13/2020 0930   CO2 29 02/13/2020 0930   BUN 8 02/13/2020 0930   CREATININE 0.62 02/13/2020 0930   CREATININE 0.76 01/16/2020 1437      Component Value Date/Time   CALCIUM 9.2 02/13/2020 0930   ALKPHOS 63 01/16/2020 1437   AST 21 01/16/2020 1437   ALT 17 01/16/2020 1437   BILITOT 0.5 01/16/2020 1437       RADIOGRAPHIC  STUDIES: DG Chest 2 View  Result Date: 02/17/2020 CLINICAL DATA:  Shortness of breath EXAM: CHEST - 2 VIEW COMPARISON:  01/02/2020 and prior. FINDINGS: Increased conspicuity of left mid to lower lung opacities and small left pleural effusion. No pneumothorax. Clear right lung. Stable cardiomediastinal silhouette. Multilevel spondylosis. IMPRESSION: Left lung opacities and small left effusion, increased since prior exam. Electronically Signed   By: Primitivo Gauze M.D.   On: 02/17/2020 13:49   CT Biopsy  Result Date: 02/13/2020 INDICATION: 75 year old female with a history of FDG avid pleural disease, potential malignancy referred for biopsy EXAM: CT BIOPSY MEDICATIONS: None. ANESTHESIA/SEDATION: Moderate (conscious) sedation was employed during this procedure. A total of Versed 1.0 mg and Fentanyl 50 mcg was administered intravenously. Moderate Sedation Time: 10 minutes. The patient's level of consciousness and vital signs were monitored continuously by radiology nursing throughout the procedure under my direct supervision. FLUOROSCOPY TIME:  CT COMPLICATIONS: None PROCEDURE: The procedure, risks, benefits, and alternatives were explained to the patient and the patient's family. Specific risks that were addressed included bleeding, infection, pneumothorax, need for further procedure including chest tube placement, chance of delayed pneumothorax or hemorrhage, hemoptysis, nondiagnostic sample, cardiopulmonary collapse, death.  Questions regarding the procedure were encouraged and answered. The patient understands and consents to the procedure. Patient was positioned in the prone position on the CT gantry table and a scout CT of the chest was performed for planning purposes. Once angle of approach was determined, the skin and subcutaneous tissues this scan was prepped and draped in the usual sterile fashion, and a sterile drape was applied covering the operative field. A sterile gown and sterile gloves  were used for the procedure. Local anesthesia was provided with 1% Lidocaine. The skin and subcutaneous tissues were infiltrated 1% lidocaine for local anesthesia, and a small stab incision was made with an 11 blade scalpel. Using CT guidance, a 17 gauge trocar needle was advanced into the posterior right pleuraltarget. After confirmation of the tip, separate 18 gauge core biopsies were performed. These were placed into solution for transportation to the lab. A final CT image was performed. Patient tolerated the procedure well and remained hemodynamically stable throughout. No complications were encountered and no significant blood loss was encounter IMPRESSION: Status post CT-guided biopsy of posterior right pleural mass. Signed, Dulcy Fanny. Dellia Nims, RPVI [facility] Radiology Electronically Signed   By: Corrie Mckusick D.O.   On: 02/13/2020 13:15    ASSESSMENT AND PLAN: This is a very pleasant 75 years old white female recently diagnosed with a stage IV (T1b, N2, M1 a) non-small cell lung cancer, adenocarcinoma with no actionable mutations based on the molecular studies by [employer]. The patient underwent CT-guided core biopsy of one of the left lung pleural-based nodules by interventional radiology and the final pathology was consistent with metastatic adenocarcinoma. Her tissue block was sent to foundation 1 for molecular studies and PD-L1 expression.  The molecular studies are still pending but PD-L1 expression was negative. I had a lengthy discussion with the patient today about her current condition and treatment options. The patient understands that she has incurable condition and all the treatment will be of palliative nature. I gave the patient the option of palliative care versus palliative systemic chemotherapy with carboplatin for AUC of 5, Alimta 500 mg/M2 and Keytruda 200 mg IV every 3 weeks. I discussed with the patient the adverse effect of  this treatment including but not limited to alopecia, myelosuppression, nausea and vomiting, peripheral neuropathy, liver or renal dysfunction as well as immunotherapy adverse effects. If the pending molecular studies showed any actionable mutation, we will cancel her chemotherapy and start the patient on targeted therapy. The patient will receive vitamin B12 injection today.  I also send prescription for folic acid and Compazine to her pharmacy. I will arrange for the patient to have a chemotherapy education class before the first dose of her treatment. She is expected to start the first cycle of this treatment on March 10, 2020. The patient will come back for follow-up visit 1 week after her treatment for evaluation and management of any adverse effect of her treatment. The patient was advised to call immediately if she has any other concerning symptoms in the interval. I will provide the patient with handout about her medication today. The patient voices understanding of current disease status and treatment options and is in agreement with the current care plan.  All questions were answered. The patient knows to call the clinic with any problems, questions or concerns. We can certainly see the patient much sooner if necessary. The total time spent in the appointment was 55 minutes. Disclaimer: This note was dictated with  voice recognition software. Similar sounding words can inadvertently be transcribed and may not be corrected upon review.

## 2020-03-02 NOTE — Progress Notes (Signed)
START ON PATHWAY REGIMEN - Non-Small Cell Lung     A cycle is every 21 days:     Pembrolizumab      Pemetrexed      Carboplatin   **Always confirm dose/schedule in your pharmacy ordering system**  Patient Characteristics: Stage IV Metastatic, Nonsquamous, Initial Chemotherapy/Immunotherapy, PS = 0, 1, ALK Rearrangement Negative and ROS1 Rearrangement Negative and NTRK Gene Fusion?Negative and RET Gene Fusion?Negative and EGFR Mutation Negative, PD-L1 Expression Positive  1-49% (TPS) / Negative / Not Tested / Awaiting Test Results and Immunotherapy Candidate Therapeutic Status: Stage IV Metastatic Histology: Nonsquamous Cell ROS1 Rearrangement Status: Negative Other Mutations/Biomarkers: No Other Actionable Mutations Chemotherapy/Immunotherapy LOT: Initial Chemotherapy/Immunotherapy Molecular Targeted Therapy: Not Appropriate KRAS G12C Mutation Status: Negative MET Exon 14 Mutation Status: Negative RET Gene Fusion Status: Negative EGFR Mutation Status: Negative/Wild Type NTRK Gene Fusion Status: Negative PD-L1 Expression Status: PD-L1 Negative ALK Rearrangement Status: Negative BRAF V600E Mutation Status: Negative ECOG Performance Status: 1 Biomarker Assessment Status Confirmation: All Genomic Markers Negative, or Only MET+ or BRAF+ or KRAS G12C+ Immunotherapy Candidate Status: Candidate for Immunotherapy Intent of Therapy: Non-Curative / Palliative Intent, Discussed with Patient

## 2020-03-02 NOTE — Patient Instructions (Signed)
Pembrolizumab injection What is this medicine? PEMBROLIZUMAB (pem broe liz ue mab) is a monoclonal antibody. It is used to treat certain types of cancer. This medicine may be used for other purposes; ask your health care provider or pharmacist if you have questions. COMMON BRAND NAME(S): Keytruda What should I tell my health care provider before I take this medicine? They need to know if you have any of these conditions:  diabetes  immune system problems  inflammatory bowel disease  liver disease  lung or breathing disease  lupus  received or scheduled to receive an organ transplant or a stem-cell transplant that uses donor stem cells  an unusual or allergic reaction to pembrolizumab, other medicines, foods, dyes, or preservatives  pregnant or trying to get pregnant  breast-feeding How should I use this medicine? This medicine is for infusion into a vein. It is given by a health care professional in a hospital or clinic setting. A special MedGuide will be given to you before each treatment. Be sure to read this information carefully each time. Talk to your pediatrician regarding the use of this medicine in children. While this drug may be prescribed for children as young as 6 months for selected conditions, precautions do apply. Overdosage: If you think you have taken too much of this medicine contact a poison control center or emergency room at once. NOTE: This medicine is only for you. Do not share this medicine with others. What if I miss a dose? It is important not to miss your dose. Call your doctor or health care professional if you are unable to keep an appointment. What may interact with this medicine? Interactions have not been studied. Give your health care provider a list of all the medicines, herbs, non-prescription drugs, or dietary supplements you use. Also tell them if you smoke, drink alcohol, or use illegal drugs. Some items may interact with your medicine. This  list may not describe all possible interactions. Give your health care provider a list of all the medicines, herbs, non-prescription drugs, or dietary supplements you use. Also tell them if you smoke, drink alcohol, or use illegal drugs. Some items may interact with your medicine. What should I watch for while using this medicine? Your condition will be monitored carefully while you are receiving this medicine. You may need blood work done while you are taking this medicine. Do not become pregnant while taking this medicine or for 4 months after stopping it. Women should inform their doctor if they wish to become pregnant or think they might be pregnant. There is a potential for serious side effects to an unborn child. Talk to your health care professional or pharmacist for more information. Do not breast-feed an infant while taking this medicine or for 4 months after the last dose. What side effects may I notice from receiving this medicine? Side effects that you should report to your doctor or health care professional as soon as possible:  allergic reactions like skin rash, itching or hives, swelling of the face, lips, or tongue  bloody or black, tarry  breathing problems  changes in vision  chest pain  chills  confusion  constipation  cough  diarrhea  dizziness or feeling faint or lightheaded  fast or irregular heartbeat  fever  flushing  joint pain  low blood counts - this medicine may decrease the number of white blood cells, red blood cells and platelets. You may be at increased risk for infections and bleeding.  muscle pain  muscle  weakness  pain, tingling, numbness in the hands or feet  persistent headache  redness, blistering, peeling or loosening of the skin, including inside the mouth  signs and symptoms of high blood sugar such as dizziness; dry mouth; dry skin; fruity breath; nausea; stomach pain; increased hunger or thirst; increased urination  signs  and symptoms of kidney injury like trouble passing urine or change in the amount of urine  signs and symptoms of liver injury like dark urine, light-colored stools, loss of appetite, nausea, right upper belly pain, yellowing of the eyes or skin  sweating  swollen lymph nodes  weight loss Side effects that usually do not require medical attention (report to your doctor or health care professional if they continue or are bothersome):  decreased appetite  hair loss  muscle pain  tiredness This list may not describe all possible side effects. Call your doctor for medical advice about side effects. You may report side effects to FDA at 1-800-FDA-1088. Where should I keep my medicine? This drug is given in a hospital or clinic and will not be stored at home. NOTE: This sheet is a summary. It may not cover all possible information. If you have questions about this medicine, talk to your doctor, pharmacist, or health care provider.  2020 Elsevier/Gold Standard (2018-12-21 18:07:58) Pemetrexed injection What is this medicine? PEMETREXED (PEM e TREX ed) is a chemotherapy drug used to treat lung cancers like non-small cell lung cancer and mesothelioma. It may also be used to treat other cancers. This medicine may be used for other purposes; ask your health care provider or pharmacist if you have questions. COMMON BRAND NAME(S): Alimta What should I tell my health care provider before I take this medicine? They need to know if you have any of these conditions:  infection (especially a virus infection such as chickenpox, cold sores, or herpes)  kidney disease  low blood counts, like low white cell, platelet, or red cell counts  lung or breathing disease, like asthma  radiation therapy  an unusual or allergic reaction to pemetrexed, other medicines, foods, dyes, or preservative  pregnant or trying to get pregnant  breast-feeding How should I use this medicine? This drug is given as  an infusion into a vein. It is administered in a hospital or clinic by a specially trained health care professional. Talk to your pediatrician regarding the use of this medicine in children. Special care may be needed. Overdosage: If you think you have taken too much of this medicine contact a poison control center or emergency room at once. NOTE: This medicine is only for you. Do not share this medicine with others. What if I miss a dose? It is important not to miss your dose. Call your doctor or health care professional if you are unable to keep an appointment. What may interact with this medicine? This medicine may interact with the following medications:  Ibuprofen This list may not describe all possible interactions. Give your health care provider a list of all the medicines, herbs, non-prescription drugs, or dietary supplements you use. Also tell them if you smoke, drink alcohol, or use illegal drugs. Some items may interact with your medicine. What should I watch for while using this medicine? Visit your doctor for checks on your progress. This drug may make you feel generally unwell. This is not uncommon, as chemotherapy can affect healthy cells as well as cancer cells. Report any side effects. Continue your course of treatment even though you feel ill unless  your doctor tells you to stop. In some cases, you may be given additional medicines to help with side effects. Follow all directions for their use. Call your doctor or health care professional for advice if you get a fever, chills or sore throat, or other symptoms of a cold or flu. Do not treat yourself. This drug decreases your body's ability to fight infections. Try to avoid being around people who are sick. This medicine may increase your risk to bruise or bleed. Call your doctor or health care professional if you notice any unusual bleeding. Be careful brushing and flossing your teeth or using a toothpick because you may get an  infection or bleed more easily. If you have any dental work done, tell your dentist you are receiving this medicine. Avoid taking products that contain aspirin, acetaminophen, ibuprofen, naproxen, or ketoprofen unless instructed by your doctor. These medicines may hide a fever. Call your doctor or health care professional if you get diarrhea or mouth sores. Do not treat yourself. To protect your kidneys, drink water or other fluids as directed while you are taking this medicine. Do not become pregnant while taking this medicine or for 6 months after stopping it. Women should inform their doctor if they wish to become pregnant or think they might be pregnant. Men should not father a child while taking this medicine and for 3 months after stopping it. This may interfere with the ability to father a child. You should talk to your doctor or health care professional if you are concerned about your fertility. There is a potential for serious side effects to an unborn child. Talk to your health care professional or pharmacist for more information. Do not breast-feed an infant while taking this medicine or for 1 week after stopping it. What side effects may I notice from receiving this medicine? Side effects that you should report to your doctor or health care professional as soon as possible:  allergic reactions like skin rash, itching or hives, swelling of the face, lips, or tongue  breathing problems  redness, blistering, peeling or loosening of the skin, including inside the mouth  signs and symptoms of bleeding such as bloody or black, tarry stools; red or dark-brown urine; spitting up blood or brown material that looks like coffee grounds; red spots on the skin; unusual bruising or bleeding from the eye, gums, or nose  signs and symptoms of infection like fever or chills; cough; sore throat; pain or trouble passing urine  signs and symptoms of kidney injury like trouble passing urine or change in the  amount of urine  signs and symptoms of liver injury like dark yellow or brown urine; general ill feeling or flu-like symptoms; light-colored stools; loss of appetite; nausea; right upper belly pain; unusually weak or tired; yellowing of the eyes or skin Side effects that usually do not require medical attention (report to your doctor or health care professional if they continue or are bothersome):  constipation  mouth sores  nausea, vomiting  unusually weak or tired This list may not describe all possible side effects. Call your doctor for medical advice about side effects. You may report side effects to FDA at 1-800-FDA-1088. Where should I keep my medicine? This drug is given in a hospital or clinic and will not be stored at home. NOTE: This sheet is a summary. It may not cover all possible information. If you have questions about this medicine, talk to your doctor, pharmacist, or health care provider.  2020  Elsevier/Gold Standard (2017-04-05 16:11:33) Carboplatin injection What is this medicine? CARBOPLATIN (KAR boe pla tin) is a chemotherapy drug. It targets fast dividing cells, like cancer cells, and causes these cells to die. This medicine is used to treat ovarian cancer and many other cancers. This medicine may be used for other purposes; ask your health care provider or pharmacist if you have questions. COMMON BRAND NAME(S): Paraplatin What should I tell my health care provider before I take this medicine? They need to know if you have any of these conditions:  blood disorders  hearing problems  kidney disease  recent or ongoing radiation therapy  an unusual or allergic reaction to carboplatin, cisplatin, other chemotherapy, other medicines, foods, dyes, or preservatives  pregnant or trying to get pregnant  breast-feeding How should I use this medicine? This drug is usually given as an infusion into a vein. It is administered in a hospital or clinic by a specially  trained health care professional. Talk to your pediatrician regarding the use of this medicine in children. Special care may be needed. Overdosage: If you think you have taken too much of this medicine contact a poison control center or emergency room at once. NOTE: This medicine is only for you. Do not share this medicine with others. What if I miss a dose? It is important not to miss a dose. Call your doctor or health care professional if you are unable to keep an appointment. What may interact with this medicine?  medicines for seizures  medicines to increase blood counts like filgrastim, pegfilgrastim, sargramostim  some antibiotics like amikacin, gentamicin, neomycin, streptomycin, tobramycin  vaccines Talk to your doctor or health care professional before taking any of these medicines:  acetaminophen  aspirin  ibuprofen  ketoprofen  naproxen This list may not describe all possible interactions. Give your health care provider a list of all the medicines, herbs, non-prescription drugs, or dietary supplements you use. Also tell them if you smoke, drink alcohol, or use illegal drugs. Some items may interact with your medicine. What should I watch for while using this medicine? Your condition will be monitored carefully while you are receiving this medicine. You will need important blood work done while you are taking this medicine. This drug may make you feel generally unwell. This is not uncommon, as chemotherapy can affect healthy cells as well as cancer cells. Report any side effects. Continue your course of treatment even though you feel ill unless your doctor tells you to stop. In some cases, you may be given additional medicines to help with side effects. Follow all directions for their use. Call your doctor or health care professional for advice if you get a fever, chills or sore throat, or other symptoms of a cold or flu. Do not treat yourself. This drug decreases your body's  ability to fight infections. Try to avoid being around people who are sick. This medicine may increase your risk to bruise or bleed. Call your doctor or health care professional if you notice any unusual bleeding. Be careful brushing and flossing your teeth or using a toothpick because you may get an infection or bleed more easily. If you have any dental work done, tell your dentist you are receiving this medicine. Avoid taking products that contain aspirin, acetaminophen, ibuprofen, naproxen, or ketoprofen unless instructed by your doctor. These medicines may hide a fever. Do not become pregnant while taking this medicine. Women should inform their doctor if they wish to become pregnant or think they might  be pregnant. There is a potential for serious side effects to an unborn child. Talk to your health care professional or pharmacist for more information. Do not breast-feed an infant while taking this medicine. What side effects may I notice from receiving this medicine? Side effects that you should report to your doctor or health care professional as soon as possible:  allergic reactions like skin rash, itching or hives, swelling of the face, lips, or tongue  signs of infection - fever or chills, cough, sore throat, pain or difficulty passing urine  signs of decreased platelets or bleeding - bruising, pinpoint red spots on the skin, black, tarry stools, nosebleeds  signs of decreased red blood cells - unusually weak or tired, fainting spells, lightheadedness  breathing problems  changes in hearing  changes in vision  chest pain  high blood pressure  low blood counts - This drug may decrease the number of white blood cells, red blood cells and platelets. You may be at increased risk for infections and bleeding.  nausea and vomiting  pain, swelling, redness or irritation at the injection site  pain, tingling, numbness in the hands or feet  problems with balance, talking,  walking  trouble passing urine or change in the amount of urine Side effects that usually do not require medical attention (report to your doctor or health care professional if they continue or are bothersome):  hair loss  loss of appetite  metallic taste in the mouth or changes in taste This list may not describe all possible side effects. Call your doctor for medical advice about side effects. You may report side effects to FDA at 1-800-FDA-1088. Where should I keep my medicine? This drug is given in a hospital or clinic and will not be stored at home. NOTE: This sheet is a summary. It may not cover all possible information. If you have questions about this medicine, talk to your doctor, pharmacist, or health care provider.  2020 Elsevier/Gold Standard (2007-05-22 14:38:05) Lung Cancer Lung cancer is an abnormal growth of cancerous cells that forms a mass (malignant tumor) in a lung. There are several types of lung cancer. The types are based on the appearance of the tumor cells. The two most common types are:  Non-small cell lung cancer. This type of lung cancer is the most common type. Non-small cell lung cancers include squamous cell carcinoma, adenocarcinoma, and large cell carcinoma.  Small cell lung cancer. In this type of lung cancer, abnormal cells are smaller than those of non-small cell lung cancer. Small cell lung cancer gets worse (progresses) faster than non-small cell lung cancer. What are the causes? The most common cause of lung cancer is smoking tobacco. The second most common cause is exposure to a chemical called radon. What increases the risk? You are more likely to develop this condition if:  You smoke tobacco.  You have been exposed to: ? Secondhand tobacco smoke. ? Radon gas. ? Uranium. ? Asbestos. ? Arsenic in drinking water. ? Air pollution.  You have a family or personal history of lung cancer.  You have had lung radiation therapy in the past.  You  are older than age 29. What are the signs or symptoms? In the early stages, you may not have any symptoms. As the cancer progresses, symptoms may include:  A lasting cough, possibly with blood.  Fatigue.  Unexplained weight loss.  Shortness of breath.  Loud breathing (wheezing).  Chest pain.  Loss of appetite. Symptoms of advanced lung cancer  include:  Hoarseness.  Bone or joint pain.  Weakness.  Change in the structure of the fingernails (clubbing), so that the nail looks like an upside-down spoon.  Swelling of the face or arms.  Inability to move the face (paralysis).  Drooping eyelids. How is this diagnosed? This condition may be diagnosed based on:  Your symptoms and medical history.  A physical exam.  A chest X-ray.  A CT scan.  Blood tests.  Sputum tests.  Removal of a sample of lung tissue (lung biopsy) for testing. Your cancer will be assessed (staged) to determine how severe it is and how much it has spread (metastasized). How is this treated? Treatment depends on the type and stage of your cancer. Treatment may include one or more of the following:  Surgery to remove as much of the cancer as possible. Lymph nodes in the area may be removed and tested for cancer as well.  Medicines that kill cancer cells (chemotherapy).  High-energy rays that kill cancer cells (radiation therapy).  Chemotherapy. This treatment uses medicines to destroy cancer cells.  Targeted therapy. This targets specific parts of cancer cells and the area around them to block the growth and spread of the cancer. Targeted therapy can help limit the damage to healthy cells. Follow these instructions at home: Eating and drinking  Some of your treatments might affect your appetite. If you are having problems eating, or if you do not have an appetite, meet with a dietitian.  If you have side effects that affect your appetite, it may help to: ? Eat smaller meals and snacks  often. ? Drink high-nutrition and high-calorie shakes or supplements. ? Eat bland and soft foods that are easy to eat. ? Avoid eating foods that are hot, spicy, or hard to swallow. General instructions   Do not use any products that contain nicotine or tobacco, such as cigarettes and e-cigarettes. If you need help quitting, ask your health care provider.  Do not drink alcohol.  If you are admitted to the hospital, make sure your cancer specialist (oncologist) is aware. Your cancer may affect your treatment for other conditions.  Take over-the-counter and prescription medicines only as told by your health care provider.  Consider joining a support group for people who have been diagnosed with lung cancer.  Work with your health care provider to manage any side effects of treatment.  Keep all follow-up visits as told by your health care provider. This is important. Where to find more information  American Cancer Society: https://www.cancer.[place] ([place]): https://www.cancer.gov Contact a health care provider if you:  Lose weight without trying.  Have a persistent cough and wheezing.  Feel short of breath.  Get tired easily.  Have bone or joint pain.  Have difficulty swallowing.  Notice that your voice is changing or getting hoarse.  Have pain that does not get better with medicine. Get help right away if you:  Cough up blood.  Have new breathing problems.  Have chest pain.  Have a fever.  Have swelling in an ankle, leg, or arm, or the face or neck.  Have paralysis in your face.  Are very confused.  Have a drooping eyelid. Summary  Lung cancer is an abnormal growth of cancerous cells that forms a mass (malignant tumor) in a lung.  There are several types of lung cancer. The types are based on the appearance of the tumor cells. The two most common types are non-small cell and small cell.  The most common cause of lung cancer is  smoking tobacco.  Early symptoms include a lasting cough, possibly with blood, and fatigue, unexplained weight loss, and shortness of breath.  After diagnosis, treatment depends on the type and stage of your cancer. This information is not intended to replace advice given to you by your health care provider. Make sure you discuss any questions you have with your health care provider. Document Revised: 01/27/2017 Document Reviewed: 12/22/2016 Elsevier Patient Education  2020 [employer].

## 2020-03-05 ENCOUNTER — Telehealth: Payer: Self-pay | Admitting: Internal Medicine

## 2020-03-05 NOTE — Telephone Encounter (Signed)
Scheduled per 1/3 los. Called and spoke with pt, confirmed all added appts

## 2020-03-06 ENCOUNTER — Other Ambulatory Visit: Payer: Self-pay

## 2020-03-06 ENCOUNTER — Inpatient Hospital Stay: Payer: Medicare PPO

## 2020-03-06 DIAGNOSIS — C3492 Malignant neoplasm of unspecified part of left bronchus or lung: Secondary | ICD-10-CM | POA: Diagnosis not present

## 2020-03-06 DIAGNOSIS — R079 Chest pain, unspecified: Secondary | ICD-10-CM | POA: Diagnosis not present

## 2020-03-06 DIAGNOSIS — Z5111 Encounter for antineoplastic chemotherapy: Secondary | ICD-10-CM | POA: Diagnosis not present

## 2020-03-06 DIAGNOSIS — Z79899 Other long term (current) drug therapy: Secondary | ICD-10-CM | POA: Diagnosis not present

## 2020-03-06 LAB — CBC WITH DIFFERENTIAL (CANCER CENTER ONLY)
Abs Immature Granulocytes: 0.02 10*3/uL (ref 0.00–0.07)
Basophils Absolute: 0 10*3/uL (ref 0.0–0.1)
Basophils Relative: 0 %
Eosinophils Absolute: 0.1 10*3/uL (ref 0.0–0.5)
Eosinophils Relative: 1 %
HCT: 38 % (ref 36.0–46.0)
Hemoglobin: 12.8 g/dL (ref 12.0–15.0)
Immature Granulocytes: 0 %
Lymphocytes Relative: 15 %
Lymphs Abs: 1 10*3/uL (ref 0.7–4.0)
MCH: 29.1 pg (ref 26.0–34.0)
MCHC: 33.7 g/dL (ref 30.0–36.0)
MCV: 86.4 fL (ref 80.0–100.0)
Monocytes Absolute: 0.4 10*3/uL (ref 0.1–1.0)
Monocytes Relative: 6 %
Neutro Abs: 5.3 10*3/uL (ref 1.7–7.7)
Neutrophils Relative %: 78 %
Platelet Count: 289 10*3/uL (ref 150–400)
RBC: 4.4 MIL/uL (ref 3.87–5.11)
RDW: 12.8 % (ref 11.5–15.5)
WBC Count: 6.8 10*3/uL (ref 4.0–10.5)
nRBC: 0 % (ref 0.0–0.2)

## 2020-03-06 LAB — CMP (CANCER CENTER ONLY)
ALT: 15 U/L (ref 0–44)
AST: 19 U/L (ref 15–41)
Albumin: 3.7 g/dL (ref 3.5–5.0)
Alkaline Phosphatase: 76 U/L (ref 38–126)
Anion gap: 10 (ref 5–15)
BUN: 10 mg/dL (ref 8–23)
CO2: 28 mmol/L (ref 22–32)
Calcium: 9.2 mg/dL (ref 8.9–10.3)
Chloride: 100 mmol/L (ref 98–111)
Creatinine: 0.75 mg/dL (ref 0.44–1.00)
GFR, Estimated: >60 mL/min (ref >60)
Glucose, Bld: 132 mg/dL — ABNORMAL HIGH (ref 70–99)
Potassium: 3.5 mmol/L (ref 3.5–5.1)
Sodium: 138 mmol/L (ref 135–145)
Total Bilirubin: 0.5 mg/dL (ref 0.3–1.2)
Total Protein: 7.3 g/dL (ref 6.5–8.1)

## 2020-03-06 LAB — TSH: TSH: 2.293 u[IU]/mL (ref 0.308–3.960)

## 2020-03-07 ENCOUNTER — Other Ambulatory Visit: Payer: Self-pay | Admitting: Internal Medicine

## 2020-03-09 ENCOUNTER — Telehealth: Payer: Self-pay | Admitting: *Deleted

## 2020-03-09 NOTE — Telephone Encounter (Signed)
Called pt & informed that Dr Julien Nordmann agreed that she should not take NSAIDs.  He suggested Tylenol.  Pt reports that Tylenol doesn't help her.  She has arthritis.  She asked if she can take Voltaren or does Dr Julien Nordmann have a suggestion.  Message routed to Dr Olene Floss

## 2020-03-09 NOTE — Progress Notes (Signed)
Pharmacist Chemotherapy Monitoring - Initial Assessment    Anticipated start date: 03/10/20  Regimen:  . Are orders appropriate based on the patient's diagnosis, regimen, and cycle? Yes . Does the plan date match the patient's scheduled date? Yes . Is the sequencing of drugs appropriate? Yes . Are the premedications appropriate for the patient's regimen? Yes . Prior Authorization for treatment is: Approved o If applicable, is the correct biosimilar selected based on the patient's insurance? not applicable  Organ Function and Labs: Marland Kitchen Are dose adjustments needed based on the patient's renal function, hepatic function, or hematologic function? Yes . Are appropriate labs ordered prior to the start of patient's treatment? Yes . Other organ system assessment, if indicated: N/A . The following baseline labs, if indicated, have been ordered: pembrolizumab: baseline TSH +/- T4  Dose Assessment: . Are the drug doses appropriate? Yes . Are the following correct: o Drug concentrations Yes o IV fluid compatible with drug Yes o Administration routes Yes o Timing of therapy Yes . If applicable, does the patient have documented access for treatment and/or plans for port-a-cath placement? not applicable . If applicable, have lifetime cumulative doses been properly documented and assessed? not applicable Lifetime Dose Tracking  No doses have been documented on this patient for the following tracked chemicals: Doxorubicin, Epirubicin, Idarubicin, Daunorubicin, Mitoxantrone, Bleomycin, Oxaliplatin, Carboplatin, Liposomal Doxorubicin  o   Toxicity Monitoring/Prevention: . The patient has the following take home antiemetics prescribed: Prochlorperazine . The patient has the following take home medications prescribed: folic acid for pemetrexed . Medication allergies and previous infusion related reactions, if applicable, have been reviewed and addressed. Yes . The patient's current medication list has  been assessed for drug-drug interactions with their chemotherapy regimen. no significant drug-drug interactions were identified on review.  Order Review: . Are the treatment plan orders signed? Yes . Is the patient scheduled to see a provider prior to their treatment? Yes  I verify that I have reviewed each item in the above checklist and answered each question accordingly.  Cindy [name] Course 03/09/2020 10:45 AM

## 2020-03-09 NOTE — Telephone Encounter (Signed)
Myrtle, you sent this to the wrong RN pod.  I will send it to [facility].

## 2020-03-09 NOTE — Telephone Encounter (Signed)
-----   Message from Curt Bears, MD sent at 03/09/2020  8:52 AM EST ----- Regarding: RE: anti-inflammatory She can use Tylenol but not NSAIDs. ----- Message ----- From: Jesse Fall, RN Sent: 03/06/2020   4:26 PM EST To: Curt Bears, MD, [facility] Subject: anti-inflammatory                              Pt takes Ibuprofen & Aleve sometimes daily or every other day.  We usually tell pt's not to take these.  She has arthritis.  What do you recommend?  Please let pt know.  Thanks, [employer]

## 2020-03-10 ENCOUNTER — Other Ambulatory Visit: Payer: Self-pay

## 2020-03-10 ENCOUNTER — Inpatient Hospital Stay: Payer: Medicare PPO

## 2020-03-10 VITALS — BP 145/62 | HR 77 | Temp 98.5°F | Resp 20 | Wt 190.2 lb

## 2020-03-10 DIAGNOSIS — C3492 Malignant neoplasm of unspecified part of left bronchus or lung: Secondary | ICD-10-CM | POA: Diagnosis not present

## 2020-03-10 DIAGNOSIS — Z79899 Other long term (current) drug therapy: Secondary | ICD-10-CM | POA: Diagnosis not present

## 2020-03-10 DIAGNOSIS — Z5111 Encounter for antineoplastic chemotherapy: Secondary | ICD-10-CM | POA: Diagnosis not present

## 2020-03-10 DIAGNOSIS — R079 Chest pain, unspecified: Secondary | ICD-10-CM | POA: Diagnosis not present

## 2020-03-10 MED ORDER — SODIUM CHLORIDE 0.9 % IV SOLN
150.0000 mg | Freq: Once | INTRAVENOUS | Status: AC
  Administered 2020-03-10: 150 mg via INTRAVENOUS
  Filled 2020-03-10: qty 150

## 2020-03-10 MED ORDER — SODIUM CHLORIDE 0.9 % IV SOLN
200.0000 mg | Freq: Once | INTRAVENOUS | Status: AC
  Administered 2020-03-10: 200 mg via INTRAVENOUS
  Filled 2020-03-10: qty 8

## 2020-03-10 MED ORDER — SODIUM CHLORIDE 0.9 % IV SOLN
Freq: Once | INTRAVENOUS | Status: AC
  Filled 2020-03-10: qty 250

## 2020-03-10 MED ORDER — SODIUM CHLORIDE 0.9 % IV SOLN
10.0000 mg | Freq: Once | INTRAVENOUS | Status: AC
  Administered 2020-03-10: 10 mg via INTRAVENOUS
  Filled 2020-03-10: qty 10

## 2020-03-10 MED ORDER — SODIUM CHLORIDE 0.9 % IV SOLN
500.0000 mg/m2 | Freq: Once | INTRAVENOUS | Status: AC
  Administered 2020-03-10: 1000 mg via INTRAVENOUS
  Filled 2020-03-10: qty 40

## 2020-03-10 MED ORDER — PALONOSETRON HCL INJECTION 0.25 MG/5ML
0.2500 mg | Freq: Once | INTRAVENOUS | Status: AC
  Administered 2020-03-10: 0.25 mg via INTRAVENOUS

## 2020-03-10 MED ORDER — PALONOSETRON HCL INJECTION 0.25 MG/5ML
INTRAVENOUS | Status: AC
  Filled 2020-03-10: qty 5

## 2020-03-10 MED ORDER — SODIUM CHLORIDE 0.9 % IV SOLN
477.5000 mg | Freq: Once | INTRAVENOUS | Status: AC
  Administered 2020-03-10: 480 mg via INTRAVENOUS
  Filled 2020-03-10: qty 48

## 2020-03-10 NOTE — Patient Instructions (Signed)
[facility] Discharge Instructions for Patients Receiving Chemotherapy  Today you received the following immunotherapy agent: Pembrolizumab (Keytruda) and chemotherapy agents: Pemetrexed (Alimta) and Carboplatin  To help prevent nausea and vomiting after your treatment, we encourage you to take your nausea medication as directed by your MD.   If you develop nausea and vomiting that is not controlled by your nausea medication, call the clinic.   BELOW ARE SYMPTOMS THAT SHOULD BE REPORTED IMMEDIATELY:  *FEVER GREATER THAN 100.5 F  *CHILLS WITH OR WITHOUT FEVER  NAUSEA AND VOMITING THAT IS NOT CONTROLLED WITH YOUR NAUSEA MEDICATION  *UNUSUAL SHORTNESS OF BREATH  *UNUSUAL BRUISING OR BLEEDING  TENDERNESS IN MOUTH AND THROAT WITH OR WITHOUT PRESENCE OF ULCERS  *URINARY PROBLEMS  *BOWEL PROBLEMS  UNUSUAL RASH Items with * indicate a potential emergency and should be followed up as soon as possible.  Feel free to call the clinic should you have any questions or concerns. The clinic phone number is [phone].  Please show the [facility] at check-in to the Emergency Department and triage nurse.  Pembrolizumab injection What is this medicine? PEMBROLIZUMAB (pem broe liz ue mab) is a monoclonal antibody. It is used to treat certain types of cancer. This medicine may be used for other purposes; ask your health care provider or pharmacist if you have questions. COMMON BRAND NAME(S): Keytruda What should I tell my health care provider before I take this medicine? They need to know if you have any of these conditions:  autoimmune diseases like Crohn's disease, ulcerative colitis, or lupus  have had or planning to have an allogeneic stem cell transplant (uses someone else's stem cells)  history of organ transplant  history of chest radiation  nervous system problems like myasthenia gravis or Guillain-Barre syndrome  an unusual or allergic reaction to  pembrolizumab, other medicines, foods, dyes, or preservatives  pregnant or trying to get pregnant  breast-feeding How should I use this medicine? This medicine is for infusion into a vein. It is given by a health care professional in a hospital or clinic setting. A special MedGuide will be given to you before each treatment. Be sure to read this information carefully each time. Talk to your pediatrician regarding the use of this medicine in children. While this drug may be prescribed for children as young as 6 months for selected conditions, precautions do apply. Overdosage: If you think you have taken too much of this medicine contact a poison control center or emergency room at once. NOTE: This medicine is only for you. Do not share this medicine with others. What if I miss a dose? It is important not to miss your dose. Call your doctor or health care professional if you are unable to keep an appointment. What may interact with this medicine? Interactions have not been studied. This list may not describe all possible interactions. Give your health care provider a list of all the medicines, herbs, non-prescription drugs, or dietary supplements you use. Also tell them if you smoke, drink alcohol, or use illegal drugs. Some items may interact with your medicine. What should I watch for while using this medicine? Your condition will be monitored carefully while you are receiving this medicine. You may need blood work done while you are taking this medicine. Do not become pregnant while taking this medicine or for 4 months after stopping it. Women should inform their doctor if they wish to become pregnant or think they might be pregnant. There is a  potential for serious side effects to an unborn child. Talk to your health care professional or pharmacist for more information. Do not breast-feed an infant while taking this medicine or for 4 months after the last dose. What side effects may I notice  from receiving this medicine? Side effects that you should report to your doctor or health care professional as soon as possible:  allergic reactions like skin rash, itching or hives, swelling of the face, lips, or tongue  bloody or black, tarry  breathing problems  changes in vision  chest pain  chills  confusion  constipation  cough  diarrhea  dizziness or feeling faint or lightheaded  fast or irregular heartbeat  fever  flushing  joint pain  low blood counts - this medicine may decrease the number of white blood cells, red blood cells and platelets. You may be at increased risk for infections and bleeding.  muscle pain  muscle weakness  pain, tingling, numbness in the hands or feet  persistent headache  redness, blistering, peeling or loosening of the skin, including inside the mouth  signs and symptoms of high blood sugar such as dizziness; dry mouth; dry skin; fruity breath; nausea; stomach pain; increased hunger or thirst; increased urination  signs and symptoms of kidney injury like trouble passing urine or change in the amount of urine  signs and symptoms of liver injury like dark urine, light-colored stools, loss of appetite, nausea, right upper belly pain, yellowing of the eyes or skin  sweating  swollen lymph nodes  weight loss Side effects that usually do not require medical attention (report to your doctor or health care professional if they continue or are bothersome):  decreased appetite  hair loss  tiredness This list may not describe all possible side effects. Call your doctor for medical advice about side effects. You may report side effects to FDA at 1-800-FDA-1088. Where should I keep my medicine? This drug is given in a hospital or clinic and will not be stored at home. NOTE: This sheet is a summary. It may not cover all possible information. If you have questions about this medicine, talk to your doctor, pharmacist, or health  care provider.  2021 Elsevier/Gold Standard (2019-01-16 21:44:53)  Pemetrexed injection What is this medicine? PEMETREXED (PEM e TREX ed) is a chemotherapy drug used to treat lung cancers like non-small cell lung cancer and mesothelioma. It may also be used to treat other cancers. This medicine may be used for other purposes; ask your health care provider or pharmacist if you have questions. COMMON BRAND NAME(S): Alimta What should I tell my health care provider before I take this medicine? They need to know if you have any of these conditions:  infection (especially a virus infection such as chickenpox, cold sores, or herpes)  kidney disease  low blood counts, like low white cell, platelet, or red cell counts  lung or breathing disease, like asthma  radiation therapy  an unusual or allergic reaction to pemetrexed, other medicines, foods, dyes, or preservative  pregnant or trying to get pregnant  breast-feeding How should I use this medicine? This drug is given as an infusion into a vein. It is administered in a hospital or clinic by a specially trained health care professional. Talk to your pediatrician regarding the use of this medicine in children. Special care may be needed. Overdosage: If you think you have taken too much of this medicine contact a poison control center or emergency room at once. NOTE: This medicine  is only for you. Do not share this medicine with others. What if I miss a dose? It is important not to miss your dose. Call your doctor or health care professional if you are unable to keep an appointment. What may interact with this medicine? This medicine may interact with the following medications:  Ibuprofen This list may not describe all possible interactions. Give your health care provider a list of all the medicines, herbs, non-prescription drugs, or dietary supplements you use. Also tell them if you smoke, drink alcohol, or use illegal drugs. Some items  may interact with your medicine. What should I watch for while using this medicine? Visit your doctor for checks on your progress. This drug may make you feel generally unwell. This is not uncommon, as chemotherapy can affect healthy cells as well as cancer cells. Report any side effects. Continue your course of treatment even though you feel ill unless your doctor tells you to stop. In some cases, you may be given additional medicines to help with side effects. Follow all directions for their use. Call your doctor or health care professional for advice if you get a fever, chills or sore throat, or other symptoms of a cold or flu. Do not treat yourself. This drug decreases your body's ability to fight infections. Try to avoid being around people who are sick. This medicine may increase your risk to bruise or bleed. Call your doctor or health care professional if you notice any unusual bleeding. Be careful brushing and flossing your teeth or using a toothpick because you may get an infection or bleed more easily. If you have any dental work done, tell your dentist you are receiving this medicine. Avoid taking products that contain aspirin, acetaminophen, ibuprofen, naproxen, or ketoprofen unless instructed by your doctor. These medicines may hide a fever. Call your doctor or health care professional if you get diarrhea or mouth sores. Do not treat yourself. To protect your kidneys, drink water or other fluids as directed while you are taking this medicine. Do not become pregnant while taking this medicine or for 6 months after stopping it. Women should inform their doctor if they wish to become pregnant or think they might be pregnant. Men should not father a child while taking this medicine and for 3 months after stopping it. This may interfere with the ability to father a child. You should talk to your doctor or health care professional if you are concerned about your fertility. There is a potential for  serious side effects to an unborn child. Talk to your health care professional or pharmacist for more information. Do not breast-feed an infant while taking this medicine or for 1 week after stopping it. What side effects may I notice from receiving this medicine? Side effects that you should report to your doctor or health care professional as soon as possible:  allergic reactions like skin rash, itching or hives, swelling of the face, lips, or tongue  breathing problems  redness, blistering, peeling or loosening of the skin, including inside the mouth  signs and symptoms of bleeding such as bloody or black, tarry stools; red or dark-brown urine; spitting up blood or brown material that looks like coffee grounds; red spots on the skin; unusual bruising or bleeding from the eye, gums, or nose  signs and symptoms of infection like fever or chills; cough; sore throat; pain or trouble passing urine  signs and symptoms of kidney injury like trouble passing urine or change in the amount  of urine  signs and symptoms of liver injury like dark yellow or brown urine; general ill feeling or flu-like symptoms; light-colored stools; loss of appetite; nausea; right upper belly pain; unusually weak or tired; yellowing of the eyes or skin Side effects that usually do not require medical attention (report to your doctor or health care professional if they continue or are bothersome):  constipation  mouth sores  nausea, vomiting  unusually weak or tired This list may not describe all possible side effects. Call your doctor for medical advice about side effects. You may report side effects to FDA at 1-800-FDA-1088. Where should I keep my medicine? This drug is given in a hospital or clinic and will not be stored at home. NOTE: This sheet is a summary. It may not cover all possible information. If you have questions about this medicine, talk to your doctor, pharmacist, or health care provider.  2021  Elsevier/Gold Standard (2017-04-05 16:11:33)  Carboplatin injection What is this medicine? CARBOPLATIN (KAR boe pla tin) is a chemotherapy drug. It targets fast dividing cells, like cancer cells, and causes these cells to die. This medicine is used to treat ovarian cancer and many other cancers. This medicine may be used for other purposes; ask your health care provider or pharmacist if you have questions. COMMON BRAND NAME(S): Paraplatin What should I tell my health care provider before I take this medicine? They need to know if you have any of these conditions:  blood disorders  hearing problems  kidney disease  recent or ongoing radiation therapy  an unusual or allergic reaction to carboplatin, cisplatin, other chemotherapy, other medicines, foods, dyes, or preservatives  pregnant or trying to get pregnant  breast-feeding How should I use this medicine? This drug is usually given as an infusion into a vein. It is administered in a hospital or clinic by a specially trained health care professional. Talk to your pediatrician regarding the use of this medicine in children. Special care may be needed. Overdosage: If you think you have taken too much of this medicine contact a poison control center or emergency room at once. NOTE: This medicine is only for you. Do not share this medicine with others. What if I miss a dose? It is important not to miss a dose. Call your doctor or health care professional if you are unable to keep an appointment. What may interact with this medicine?  medicines for seizures  medicines to increase blood counts like filgrastim, pegfilgrastim, sargramostim  some antibiotics like amikacin, gentamicin, neomycin, streptomycin, tobramycin  vaccines Talk to your doctor or health care professional before taking any of these medicines:  acetaminophen  aspirin  ibuprofen  ketoprofen  naproxen This list may not describe all possible interactions.  Give your health care provider a list of all the medicines, herbs, non-prescription drugs, or dietary supplements you use. Also tell them if you smoke, drink alcohol, or use illegal drugs. Some items may interact with your medicine. What should I watch for while using this medicine? Your condition will be monitored carefully while you are receiving this medicine. You will need important blood work done while you are taking this medicine. This drug may make you feel generally unwell. This is not uncommon, as chemotherapy can affect healthy cells as well as cancer cells. Report any side effects. Continue your course of treatment even though you feel ill unless your doctor tells you to stop. In some cases, you may be given additional medicines to help with side effects.  Follow all directions for their use. Call your doctor or health care professional for advice if you get a fever, chills or sore throat, or other symptoms of a cold or flu. Do not treat yourself. This drug decreases your body's ability to fight infections. Try to avoid being around people who are sick. This medicine may increase your risk to bruise or bleed. Call your doctor or health care professional if you notice any unusual bleeding. Be careful brushing and flossing your teeth or using a toothpick because you may get an infection or bleed more easily. If you have any dental work done, tell your dentist you are receiving this medicine. Avoid taking products that contain aspirin, acetaminophen, ibuprofen, naproxen, or ketoprofen unless instructed by your doctor. These medicines may hide a fever. Do not become pregnant while taking this medicine. Women should inform their doctor if they wish to become pregnant or think they might be pregnant. There is a potential for serious side effects to an unborn child. Talk to your health care professional or pharmacist for more information. Do not breast-feed an infant while taking this medicine. What  side effects may I notice from receiving this medicine? Side effects that you should report to your doctor or health care professional as soon as possible:  allergic reactions like skin rash, itching or hives, swelling of the face, lips, or tongue  signs of infection - fever or chills, cough, sore throat, pain or difficulty passing urine  signs of decreased platelets or bleeding - bruising, pinpoint red spots on the skin, black, tarry stools, nosebleeds  signs of decreased red blood cells - unusually weak or tired, fainting spells, lightheadedness  breathing problems  changes in hearing  changes in vision  chest pain  high blood pressure  low blood counts - This drug may decrease the number of white blood cells, red blood cells and platelets. You may be at increased risk for infections and bleeding.  nausea and vomiting  pain, swelling, redness or irritation at the injection site  pain, tingling, numbness in the hands or feet  problems with balance, talking, walking  trouble passing urine or change in the amount of urine Side effects that usually do not require medical attention (report to your doctor or health care professional if they continue or are bothersome):  hair loss  loss of appetite  metallic taste in the mouth or changes in taste This list may not describe all possible side effects. Call your doctor for medical advice about side effects. You may report side effects to FDA at 1-800-FDA-1088. Where should I keep my medicine? This drug is given in a hospital or clinic and will not be stored at home. NOTE: This sheet is a summary. It may not cover all possible information. If you have questions about this medicine, talk to your doctor, pharmacist, or health care provider.  2021 Elsevier/Gold Standard (2007-05-22 14:38:05)

## 2020-03-11 ENCOUNTER — Telehealth: Payer: Self-pay | Admitting: *Deleted

## 2020-03-11 DIAGNOSIS — E7800 Pure hypercholesterolemia, unspecified: Secondary | ICD-10-CM | POA: Diagnosis not present

## 2020-03-11 DIAGNOSIS — Z0000 Encounter for general adult medical examination without abnormal findings: Secondary | ICD-10-CM | POA: Diagnosis not present

## 2020-03-11 DIAGNOSIS — Z1389 Encounter for screening for other disorder: Secondary | ICD-10-CM | POA: Diagnosis not present

## 2020-03-11 DIAGNOSIS — M1990 Unspecified osteoarthritis, unspecified site: Secondary | ICD-10-CM | POA: Diagnosis not present

## 2020-03-11 DIAGNOSIS — C3490 Malignant neoplasm of unspecified part of unspecified bronchus or lung: Secondary | ICD-10-CM | POA: Diagnosis not present

## 2020-03-11 DIAGNOSIS — F39 Unspecified mood [affective] disorder: Secondary | ICD-10-CM | POA: Diagnosis not present

## 2020-03-11 DIAGNOSIS — I10 Essential (primary) hypertension: Secondary | ICD-10-CM | POA: Diagnosis not present

## 2020-03-11 DIAGNOSIS — R7303 Prediabetes: Secondary | ICD-10-CM | POA: Diagnosis not present

## 2020-03-11 DIAGNOSIS — M8588 Other specified disorders of bone density and structure, other site: Secondary | ICD-10-CM | POA: Diagnosis not present

## 2020-03-12 ENCOUNTER — Encounter: Payer: Self-pay | Admitting: Medical Oncology

## 2020-03-13 ENCOUNTER — Encounter (HOSPITAL_COMMUNITY): Payer: Self-pay

## 2020-03-13 ENCOUNTER — Other Ambulatory Visit: Payer: Self-pay | Admitting: Internal Medicine

## 2020-03-17 ENCOUNTER — Encounter: Payer: Self-pay | Admitting: *Deleted

## 2020-03-17 ENCOUNTER — Inpatient Hospital Stay: Payer: Medicare PPO | Admitting: Internal Medicine

## 2020-03-17 NOTE — Progress Notes (Signed)
I followed up on Ms. Stanchfield One report and updated Dr. Julien Nordmann of results.

## 2020-03-18 ENCOUNTER — Inpatient Hospital Stay: Payer: Medicare PPO

## 2020-03-18 ENCOUNTER — Inpatient Hospital Stay: Payer: Medicare PPO | Admitting: Internal Medicine

## 2020-03-18 ENCOUNTER — Encounter: Payer: Self-pay | Admitting: Internal Medicine

## 2020-03-18 ENCOUNTER — Other Ambulatory Visit: Payer: Self-pay

## 2020-03-18 VITALS — BP 153/73 | HR 86 | Temp 99.0°F | Resp 16 | Ht 64.0 in | Wt 190.1 lb

## 2020-03-18 DIAGNOSIS — C3492 Malignant neoplasm of unspecified part of left bronchus or lung: Secondary | ICD-10-CM

## 2020-03-18 DIAGNOSIS — Z5111 Encounter for antineoplastic chemotherapy: Secondary | ICD-10-CM

## 2020-03-18 DIAGNOSIS — Z5112 Encounter for antineoplastic immunotherapy: Secondary | ICD-10-CM

## 2020-03-18 DIAGNOSIS — Z79899 Other long term (current) drug therapy: Secondary | ICD-10-CM | POA: Diagnosis not present

## 2020-03-18 DIAGNOSIS — R079 Chest pain, unspecified: Secondary | ICD-10-CM | POA: Diagnosis not present

## 2020-03-18 LAB — CMP (CANCER CENTER ONLY)
ALT: 18 U/L (ref 0–44)
AST: 20 U/L (ref 15–41)
Albumin: 3.6 g/dL (ref 3.5–5.0)
Alkaline Phosphatase: 71 U/L (ref 38–126)
Anion gap: 11 (ref 5–15)
BUN: 11 mg/dL (ref 8–23)
CO2: 29 mmol/L (ref 22–32)
Calcium: 9.3 mg/dL (ref 8.9–10.3)
Chloride: 96 mmol/L — ABNORMAL LOW (ref 98–111)
Creatinine: 0.7 mg/dL (ref 0.44–1.00)
GFR, Estimated: >60 mL/min (ref >60)
Glucose, Bld: 117 mg/dL — ABNORMAL HIGH (ref 70–99)
Potassium: 3.3 mmol/L — ABNORMAL LOW (ref 3.5–5.1)
Sodium: 136 mmol/L (ref 135–145)
Total Bilirubin: 0.4 mg/dL (ref 0.3–1.2)
Total Protein: 7.3 g/dL (ref 6.5–8.1)

## 2020-03-18 LAB — CBC WITH DIFFERENTIAL (CANCER CENTER ONLY)
Abs Immature Granulocytes: 0.03 10*3/uL (ref 0.00–0.07)
Basophils Absolute: 0 10*3/uL (ref 0.0–0.1)
Basophils Relative: 0 %
Eosinophils Absolute: 0.1 10*3/uL (ref 0.0–0.5)
Eosinophils Relative: 2 %
HCT: 35.4 % — ABNORMAL LOW (ref 36.0–46.0)
Hemoglobin: 11.8 g/dL — ABNORMAL LOW (ref 12.0–15.0)
Immature Granulocytes: 1 %
Lymphocytes Relative: 11 %
Lymphs Abs: 0.5 10*3/uL — ABNORMAL LOW (ref 0.7–4.0)
MCH: 28.8 pg (ref 26.0–34.0)
MCHC: 33.3 g/dL (ref 30.0–36.0)
MCV: 86.3 fL (ref 80.0–100.0)
Monocytes Absolute: 0.3 10*3/uL (ref 0.1–1.0)
Monocytes Relative: 7 %
Neutro Abs: 3.7 10*3/uL (ref 1.7–7.7)
Neutrophils Relative %: 79 %
Platelet Count: 193 10*3/uL (ref 150–400)
RBC: 4.1 MIL/uL (ref 3.87–5.11)
RDW: 12.4 % (ref 11.5–15.5)
WBC Count: 4.6 10*3/uL (ref 4.0–10.5)
nRBC: 0 % (ref 0.0–0.2)

## 2020-03-18 MED ORDER — OXYCODONE-ACETAMINOPHEN 5-325 MG PO TABS: 1.0000 | ORAL_TABLET | Freq: Four times a day (QID) | ORAL | 0 refills | Status: DC | PRN

## 2020-03-18 NOTE — Progress Notes (Signed)
[facility] [phone]   [phone]  OFFICE PROGRESS NOTE  [name], MD [place] [address] [place] [address]  DIAGNOSIS: stage IV (T1b, N1, M1 a) non-small cell lung cancer favoring adenocarcinoma diagnosed in October 2021.  The pathologic immunohistochemical stains suggestive of upper GI primary but there is no finding on the PET scan or imaging study to support this possibility.  Biomarker Findings Microsatellite status - MS-Stable Tumor Mutational Burden - 3 Muts/Mb Genomic Findings For a complete list of the genes assayed, please refer to the Appendix. KRAS G12C STK11 E145* 7 Disease relevant genes with no reportable alterations: ALK, BRAF, EGFR, ERBB2, MET, RET, ROS1  PDL1 Expression 0%  PRIOR THERAPY: None  CURRENT THERAPY: Systemic chemotherapy with carboplatin for AUC of 5, Alimta 500 mg/M2 and Keytruda 200 mg IV every 3 weeks.  First dose March 11, 2019.  Status post 1 cycle.  INTERVAL HISTORY: Cindy [name] 75 y.o. female returns to the clinic today for follow-up visit.  Her friend Shauna Hugh was available by phone during the visit.  The patient tolerated the first week of her treatment fairly well except for increasing pain on the left side of her chest which got worse during the last few days.  She takes Tylenol as needed basis.  She has a baseline shortness of breath and mild cough with no hemoptysis.  She denied having any nausea, vomiting, diarrhea or constipation.  She denied having any headache or visual changes.  She has no significant weight loss or night sweats.  She had molecular studies performed by [facility] and it showed positive K-ras G12C mutation.  The patient is here today for evaluation and repeat blood work.   MEDICAL HISTORY: Past Medical History:  Diagnosis Date  . Anxiety   . Depression   . Hyperlipidemia   . Hypertension     ALLERGIES:  is allergic to escitalopram, povidone  iodine, and procaine.  MEDICATIONS:  Current Outpatient Medications  Medication Sig Dispense Refill  . ALPRAZolam (XANAX) 0.25 MG tablet Take 0.25 mg by mouth daily as needed for anxiety.    . Ascorbic Acid (VITAMIN C PO) Take 1 tablet by mouth daily.    Marland Kitchen aspirin 81 MG tablet Take 81 mg by mouth daily.    Marland Kitchen b complex vitamins capsule Take 1 capsule by mouth 2 (two) times a week.    . calcium carbonate (TUMS - DOSED IN MG ELEMENTAL CALCIUM) 500 MG chewable tablet Chew 500-1,000 mg by mouth daily as needed for indigestion or heartburn.    . folic acid (FOLVITE) 1 MG tablet Take 1 tablet (1 mg total) by mouth daily. 30 tablet 4  . hydrochlorothiazide (HYDRODIURIL) 25 MG tablet Take 25 mg by mouth daily.    Marland Kitchen ibuprofen (ADVIL) 200 MG tablet Take 200 mg by mouth every 6 (six) hours as needed for moderate pain.    . metoprolol succinate (TOPROL-XL) 50 MG 24 hr tablet Take 50 mg by mouth daily. Take with or immediately following a meal.    . Multiple Minerals-Vitamins (CALCIUM-MAGNESIUM-ZINC-D3 PO) Take 1 tablet by mouth 2 (two) times a week.    . Multiple Vitamin (MULTIVITAMIN) tablet Take 1 tablet by mouth 3 (three) times a week.    . naproxen sodium (ALEVE) 220 MG tablet Take 220 mg by mouth daily as needed (pain).    . Polyvinyl Alcohol-Povidone (REFRESH OP) Place 1 drop into both eyes daily as needed (dry eyes).    Marland Kitchen  prochlorperazine (COMPAZINE) 10 MG tablet TAKE 1 TABLET(10 MG) BY MOUTH EVERY 6 HOURS AS NEEDED FOR NAUSEA OR VOMITING 30 tablet 0  . simvastatin (ZOCOR) 20 MG tablet Take 20 mg by mouth daily.    Marland Kitchen VITAMIN D PO Take 1 capsule by mouth 3 (three) times a week.    Marland Kitchen VITAMIN E PO Take 1 capsule by mouth 2 (two) times a week.     No current facility-administered medications for this visit.    SURGICAL HISTORY:  Past Surgical History:  Procedure Laterality Date  . DENTAL SURGERY  2012 - 2013   implants  . IR THORACENTESIS ASP PLEURAL SPACE W/IMG GUIDE  12/19/2019  . WISDOM  TOOTH EXTRACTION  20    REVIEW OF SYSTEMS:  Constitutional: positive for fatigue Eyes: negative Ears, nose, mouth, throat, and face: negative Respiratory: positive for dyspnea on exertion and pleurisy/chest pain Cardiovascular: negative Gastrointestinal: negative Genitourinary:negative Integument/breast: negative Hematologic/lymphatic: negative Musculoskeletal:positive for back pain Neurological: negative Behavioral/Psych: negative Endocrine: negative Allergic/Immunologic: negative   PHYSICAL EXAMINATION: General appearance: alert, cooperative, fatigued and no distress Head: Normocephalic, without obvious abnormality, atraumatic Neck: no adenopathy, no JVD, supple, symmetrical, trachea midline and thyroid not enlarged, symmetric, no tenderness/mass/nodules Lymph nodes: Cervical, supraclavicular, and axillary nodes normal. Resp: clear to auscultation bilaterally Back: symmetric, no curvature. ROM normal. No CVA tenderness. Cardio: regular rate and rhythm, S1, S2 normal, no murmur, click, rub or gallop GI: soft, non-tender; bowel sounds normal; no masses,  no organomegaly Extremities: extremities normal, atraumatic, no cyanosis or edema Neurologic: Alert and oriented X 3, normal strength and tone. Normal symmetric reflexes. Normal coordination and gait  ECOG PERFORMANCE STATUS: 1 - Symptomatic but completely ambulatory  Blood pressure (!) 153/73, pulse 86, temperature 99 F (37.2 C), temperature source Tympanic, resp. rate 16, height $RemoveBe'5\' 4"'rPubbFOYE$  (1.626 m), weight 190 lb 1.6 oz (86.2 kg), last menstrual period 09/29/2002, SpO2 98 %.  LABORATORY DATA: Lab Results  Component Value Date   WBC 6.8 03/06/2020   HGB 12.8 03/06/2020   HCT 38.0 03/06/2020   MCV 86.4 03/06/2020   PLT 289 03/06/2020      Chemistry      Component Value Date/Time   NA 138 03/06/2020 1344   K 3.5 03/06/2020 1344   CL 100 03/06/2020 1344   CO2 28 03/06/2020 1344   BUN 10 03/06/2020 1344   CREATININE 0.75  03/06/2020 1344      Component Value Date/Time   CALCIUM 9.2 03/06/2020 1344   ALKPHOS 76 03/06/2020 1344   AST 19 03/06/2020 1344   ALT 15 03/06/2020 1344   BILITOT 0.5 03/06/2020 1344       RADIOGRAPHIC STUDIES: DG Chest 2 View  Result Date: 02/17/2020 CLINICAL DATA:  Shortness of breath EXAM: CHEST - 2 VIEW COMPARISON:  01/02/2020 and prior. FINDINGS: Increased conspicuity of left mid to lower lung opacities and small left pleural effusion. No pneumothorax. Clear right lung. Stable cardiomediastinal silhouette. Multilevel spondylosis. IMPRESSION: Left lung opacities and small left effusion, increased since prior exam. Electronically Signed   By: Primitivo Gauze M.D.   On: 02/17/2020 13:49    ASSESSMENT AND PLAN: This is a very pleasant 75 years old white female recently diagnosed with a stage IV (T1b, N2, M1 a) non-small cell lung cancer, adenocarcinoma with no actionable mutations based on the molecular studies by [facility]. The patient underwent CT-guided core biopsy of one of the left lung pleural-based nodules by interventional radiology and the final pathology was consistent with  metastatic adenocarcinoma. Her tissue block was [facility] for molecular studies and PD-L1 expression.  The molecular studies by [facility] showed positive K-ras G12C mutation which will be an option for targeted therapy in the second line setting with Lumakras (Sotorasib). The patient is currently undergoing palliative systemic chemotherapy with carboplatin for AUC of 5, Alimta 500 mg/M2 and Keytruda 200 mg IV every 3 weeks.  Status post 1 cycle.  She tolerated the first week of her treatment well except for the fatigue and the worsening pain on the left side of her chest. I had a lengthy discussion with the patient today about her current condition and treatment plan.  I strongly recommend for her to continue her current treatment with the same regimen for now. For the pain management, I  will start the patient on Percocet 5/325 every 6 hours as needed for pain. I will see the patient back for follow-up visit in 2 weeks for evaluation before starting cycle #2. She was advised to call immediately if she has any other concerning symptoms in the interval. The patient voices understanding of current disease status and treatment options and is in agreement with the current care plan.  All questions were answered. The patient knows to call the clinic with any problems, questions or concerns. We can certainly see the patient much sooner if necessary. The total time spent in the appointment was 40 minutes. Disclaimer: This note was dictated with voice recognition software. Similar sounding words can inadvertently be transcribed and may not be corrected upon review.

## 2020-03-19 ENCOUNTER — Other Ambulatory Visit: Payer: Self-pay | Admitting: Internal Medicine

## 2020-03-19 ENCOUNTER — Encounter: Payer: Self-pay | Admitting: Pulmonary Disease

## 2020-03-19 ENCOUNTER — Telehealth: Payer: Self-pay | Admitting: Medical Oncology

## 2020-03-19 ENCOUNTER — Ambulatory Visit: Payer: Medicare PPO | Admitting: Pulmonary Disease

## 2020-03-19 VITALS — BP 122/60 | HR 73 | Temp 97.9°F | Ht 64.0 in | Wt 189.5 lb

## 2020-03-19 DIAGNOSIS — R0602 Shortness of breath: Secondary | ICD-10-CM | POA: Diagnosis not present

## 2020-03-19 DIAGNOSIS — R5383 Other fatigue: Secondary | ICD-10-CM | POA: Diagnosis not present

## 2020-03-19 DIAGNOSIS — J910 Malignant pleural effusion: Secondary | ICD-10-CM

## 2020-03-19 DIAGNOSIS — C3492 Malignant neoplasm of unspecified part of left bronchus or lung: Secondary | ICD-10-CM | POA: Diagnosis not present

## 2020-03-19 MED ORDER — OXYCODONE-ACETAMINOPHEN 5-325 MG PO TABS: 1.0000 | ORAL_TABLET | Freq: Four times a day (QID) | ORAL | 0 refills | Status: DC | PRN

## 2020-03-19 MED FILL — OXYCODONE-APAP 5-325MG: 5-325 | 7 days supply | Qty: 30 | Fill #0

## 2020-03-19 NOTE — Patient Instructions (Addendum)
Thank you for visiting Dr. Valeta Harms at [facility]. Today we recommend the following:  Return in about 4 months (around 07/17/2020) for with APP or Dr. Valeta Harms.    Please do your part to reduce the spread of COVID-19.

## 2020-03-19 NOTE — Telephone Encounter (Signed)
Please send pain med to [facility] are down and they cannot fill any prescriptions. I called [facility] and cancelled rx.

## 2020-03-19 NOTE — Progress Notes (Signed)
Ultrasound chest procedure Note  Blandina Renaldo  [id number]  April 03, 1945  Date:03/19/20  Time:12:44 PM   Provider Performing:Naiyana Barbian L Sabrea Sankey   Procedure: Ultrasound chest  Indication(s)  malignant pleural effusion  Consent Risks of the procedure as well as the alternatives and risks of each were explained to the patient and/or caregiver.  Consent for the procedure was obtained and is signed in the bedside chart  Anesthesia Not applicable   Time Out Verified patient identification, verified procedure, site/side was marked, verified correct patient position, special equipment/implants available, medications/allergies/relevant history reviewed, required imaging and test results available.   Sterile Technique  not applicable  Procedure Description  IQ butterfly ultrasound was used for visualization of the left hemithorax.  There was a small amount of fluid identified, normal lung movement, pleural thickening and some debris also visualized within the pleural space.  Images captured and stored for review.  Please see images documented below.  No recurrence of effusion that needs thoracentesis at this time.  Complications/Tolerance None; patient tolerated the procedure well.  EBL 0  Specimen(s) None   Imaging:  As described above, stored images of the left hemithorax.       Garner Nash, DO [facility] 03/19/2020 12:56 PM

## 2020-03-19 NOTE — Progress Notes (Signed)
Synopsis: Referred in November 2021 for pleural effusion by [name], MD  Subjective:   PATIENT ID: Cindy [name] GENDER: female DOB: Jan 27, 1946, MRN: [id number]  Chief Complaint  Patient presents with  . Follow-up    Did her first chemo last week and did well.  Started with pain on Friday and still going on today.  Will pick up some pain meds from Dr. Earlie Server today.  Feels that her breathing is more difficult since last her treatment.      This is a 75 year old female past medical history of anxiety, hyperlipidemia, hypertension, former smoker, 15+ years, 1ppd.  Patient referred for evaluation of pleural effusion.  Patient underwent CT scan of the chest on 12/18/2019 which revealed progressive enlargement of a left-sided pleural effusion.  She also had indeterminate pulmonary nodules within the right lung 5 mm in size. History of skin cancers, grandmother with lung cancer.  OV 01/16/2020: Patient here today for follow-up after recent nuclear medicine PET scan.  Pleural effusion tapped again recently cytology also consistent with malignant effusion.  We reviewed the patient's case this morning in the medical thoracic oncology conference.  Case was discussed needing additional tissue for molecular cytogenetics.  We discussed today in the office bronchoscopy versus CT-guided biopsy.  I think last resort would be thoracoscopy with pleural biopsy if needed.  OV 03/19/2020: recently started chemo following diagnosis of lung cancer.  Patient has subsequently established care with Dr. Earlie Server.  Diagnosis of stage IV (T1b, N2, M1A (non-small cell lung cancer favoring adenocarcinoma following a thoracentesis with malignant pleural effusion.  Patient had biomarker testing complete.  Patient was started on Alimta plus carboplatin plus Keytruda.  She had her first cycle last week.  Overall doing well.  [facility] analysis did show positive K-ras G1 to see mutation.  At this time she is  occasionally feeling fatigued.  She feels better than she did last week.  She is also dealing with some constipation.  From respiratory standpoint she still feels short of breath with exertion.  I explained today we would look with ultrasound to ensure malignant pleural effusion has not gotten worse.   Past Medical History:  Diagnosis Date  . Anxiety   . Depression   . Hyperlipidemia   . Hypertension      Family History  Problem Relation Age of Onset  . Dementia Mother   . COPD Mother   . Lung cancer Maternal Grandmother   . Cirrhosis Brother      Past Surgical History:  Procedure Laterality Date  . DENTAL SURGERY  2012 - 2013   implants  . IR THORACENTESIS ASP PLEURAL SPACE W/IMG GUIDE  12/19/2019  . WISDOM TOOTH EXTRACTION  20    Social History   Socioeconomic History  . Marital status: Widowed    Spouse name: Not on file  . Number of children: 0  . Years of education: Not on file  . Highest education level: Not on file  Occupational History    Employer: [employer]  Tobacco Use  . Smoking status: Former Research scientist (life sciences)  . Smokeless tobacco: Never Used  Substance and Sexual Activity  . Alcohol use: Yes    Alcohol/week: 10.0 standard drinks    Types: 10 Standard drinks or equivalent per week  . Drug use: No  . Sexual activity: Never    Birth control/protection: Abstinence, Post-menopausal  Other Topics Concern  . Not on file  Social History Narrative  . Not on file   Social Determinants  of Health   Financial Resource Strain: Not on file  Food Insecurity: Not on file  Transportation Needs: Not on file  Physical Activity: Not on file  Stress: Not on file  Social Connections: Not on file  Intimate Partner Violence: Not on file     Allergies  Allergen Reactions  . Escitalopram      sweating, headaches Other reaction(s): sweating, headaches  . Povidone Iodine     itching  . Procaine Palpitations     Outpatient Medications Prior to Visit  Medication Sig  Dispense Refill  . ALPRAZolam (XANAX) 0.25 MG tablet Take 0.25 mg by mouth daily as needed for anxiety.    . Ascorbic Acid (VITAMIN C PO) Take 1 tablet by mouth daily.    Marland Kitchen aspirin 81 MG tablet Take 81 mg by mouth daily.    Marland Kitchen b complex vitamins capsule Take 1 capsule by mouth 2 (two) times a week.    . calcium carbonate (TUMS - DOSED IN MG ELEMENTAL CALCIUM) 500 MG chewable tablet Chew 500-1,000 mg by mouth daily as needed for indigestion or heartburn.    . folic acid (FOLVITE) 1 MG tablet Take 1 tablet (1 mg total) by mouth daily. 30 tablet 4  . hydrochlorothiazide (HYDRODIURIL) 25 MG tablet Take 25 mg by mouth daily.    . metoprolol succinate (TOPROL-XL) 50 MG 24 hr tablet Take 50 mg by mouth daily. Take with or immediately following a meal.    . Multiple Minerals-Vitamins (CALCIUM-MAGNESIUM-ZINC-D3 PO) Take 1 tablet by mouth 2 (two) times a week.    . Multiple Vitamin (MULTIVITAMIN) tablet Take 1 tablet by mouth 3 (three) times a week.    Marland Kitchen oxyCODONE-acetaminophen (PERCOCET/ROXICET) 5-325 MG tablet Take 1 tablet by mouth every 6 (six) hours as needed for severe pain. 30 tablet 0  . Polyvinyl Alcohol-Povidone (REFRESH OP) Place 1 drop into both eyes daily as needed (dry eyes).    . prochlorperazine (COMPAZINE) 10 MG tablet TAKE 1 TABLET(10 MG) BY MOUTH EVERY 6 HOURS AS NEEDED FOR NAUSEA OR VOMITING 30 tablet 0  . simvastatin (ZOCOR) 20 MG tablet Take 20 mg by mouth daily.    Marland Kitchen VITAMIN D PO Take 1 capsule by mouth 3 (three) times a week.    . Vitamin D, Ergocalciferol, (DRISDOL) 1.25 MG (50000 UNIT) CAPS capsule 1 capsule    . VITAMIN E PO Take 1 capsule by mouth 2 (two) times a week.    Marland Kitchen ibuprofen (ADVIL) 200 MG tablet Take 200 mg by mouth every 6 (six) hours as needed for moderate pain. (Patient not taking: Reported on 03/19/2020)    . naproxen sodium (ALEVE) 220 MG tablet Take 220 mg by mouth daily as needed (pain). (Patient not taking: Reported on 03/19/2020)     No facility-administered  medications prior to visit.    Review of Systems  Constitutional: Negative for chills, fever, malaise/fatigue and weight loss.  HENT: Negative for hearing loss, sore throat and tinnitus.   Eyes: Negative for blurred vision and double vision.  Respiratory: Positive for shortness of breath. Negative for cough, hemoptysis, sputum production, wheezing and stridor.   Cardiovascular: Negative for chest pain, palpitations, orthopnea, leg swelling and PND.  Gastrointestinal: Positive for constipation. Negative for abdominal pain, diarrhea, heartburn, nausea and vomiting.  Genitourinary: Negative for dysuria, hematuria and urgency.  Musculoskeletal: Negative for joint pain and myalgias.  Skin: Negative for itching and rash.  Neurological: Negative for dizziness, tingling, weakness and headaches.  Endo/Heme/Allergies: Negative for environmental allergies.  Does not bruise/bleed easily.  Psychiatric/Behavioral: Negative for depression. The patient is not nervous/anxious and does not have insomnia.   All other systems reviewed and are negative.   Objective:  Physical Exam Vitals reviewed.  Constitutional:      General: She is not in acute distress.    Appearance: She is well-developed and well-nourished. She is obese.  HENT:     Head: Normocephalic and atraumatic.     Mouth/Throat:     Mouth: Oropharynx is clear and moist.  Eyes:     General: No scleral icterus.    Conjunctiva/sclera: Conjunctivae normal.     Pupils: Pupils are equal, round, and reactive to light.  Neck:     Vascular: No JVD.     Trachea: No tracheal deviation.  Cardiovascular:     Rate and Rhythm: Normal rate and regular rhythm.     Pulses: Intact distal pulses.     Heart sounds: Normal heart sounds. No murmur heard.   Pulmonary:     Effort: Pulmonary effort is normal. No tachypnea, accessory muscle usage or respiratory distress.     Breath sounds: No stridor. No wheezing, rhonchi or rales.     Comments: Diminished  breath sounds in the left base compared to the right Abdominal:     General: Bowel sounds are normal. There is no distension.     Palpations: Abdomen is soft.     Tenderness: There is no abdominal tenderness.  Musculoskeletal:        General: No tenderness or edema.     Cervical back: Neck supple.  Lymphadenopathy:     Cervical: No cervical adenopathy.  Skin:    General: Skin is warm and dry.     Capillary Refill: Capillary refill takes less than 2 seconds.     Findings: No rash.  Neurological:     Mental Status: She is alert and oriented to person, place, and time.  Psychiatric:        Mood and Affect: Mood and affect normal.        Behavior: Behavior normal.     Vitals:   03/19/20 1204  BP: 122/60  Pulse: 73  Temp: 97.9 F (36.6 C)  TempSrc: Tympanic  SpO2: 96%  Weight: 189 lb 8 oz (86 kg)  Height: $Remove'5\' 4"'BJXloHi$  (1.626 m)   96% on RA BMI Readings from Last 3 Encounters:  03/19/20 32.53 kg/m  03/18/20 32.63 kg/m  03/10/20 32.66 kg/m   Wt Readings from Last 3 Encounters:  03/19/20 189 lb 8 oz (86 kg)  03/18/20 190 lb 1.6 oz (86.2 kg)  03/10/20 190 lb 4 oz (86.3 kg)    CBC    Component Value Date/Time   WBC 4.6 03/18/2020 1322   RBC 4.10 03/18/2020 1322   HGB 11.8 (L) 03/18/2020 1322   HGB 13.1 08/01/2013 1047   HCT 35.4 (L) 03/18/2020 1322   PLT 193 03/18/2020 1322   MCV 86.3 03/18/2020 1322   MCV 93.5 04/27/2012 0841   MCH 28.8 03/18/2020 1322   MCHC 33.3 03/18/2020 1322   RDW 12.4 03/18/2020 1322   LYMPHSABS 0.5 (L) 03/18/2020 1322   MONOABS 0.3 03/18/2020 1322   EOSABS 0.1 03/18/2020 1322   BASOSABS 0.0 03/18/2020 1322     Chest Imaging:  Chest x-ray: 01/02/2020: Status post thoracentesis in the office with no evidence of pneumothorax. The patient's images have been independently reviewed by me.    CT chest 01/02/2020: CT chest completed immediately following thoracentesis in the office  reveals studded pleural nodularity concerning for  malignancy. The patient's images have been independently reviewed by me.    02/17/2020 chest x-ray: Minimal left-sided effusion. The patient's images have been independently reviewed by me.    Pulmonary Functions Testing Results: No flowsheet data found.  FeNO:   Pathology:   Pleural fluid from 12/20/2019: CYTOLOGY - NON PAP  CASE: [id number]  PATIENT: Will Delao  Non-Gynecological Cytology Report   Clinical History:  Specimen Submitted: A. PLEURAL FLUID, LEFT, THORACENTESIS:   FINAL MICROSCOPIC DIAGNOSIS:  - Suspicious for malignancy  - See comment.   SPECIMEN ADEQUACY:  Satisfactory for evaluation   DIAGNOSTIC COMMENTS:  There are small aggregates of cells with slight nuclear enlargement and  irregularity and some of the cells have cytoplasmic vacuoles.  Immunohistochemistry shows these atypical clusters are positive with  cytokeratin 7, MOC31 and patchy positive with CDX2. They are negative  calretinin, cytokeratin 20, D2-40 TTF-1. The findings are suspicious  for adenocarcinoma and the immunophenotype suggest an upper  gastrointestinal primary needs to be ruled out.   Echocardiogram:   Heart Catheterization:      Assessment & Plan:     ICD-10-CM   1. Adenocarcinoma of left lung, stage 4 (HCC)  C34.92   2. Malignant pleural effusion  J91.0   3. SOB (shortness of breath)  R06.02   4. Fatigue, unspecified type  R53.83     Discussion:  This is a 75 year old female, former smoker, 15-pack-year history, diagnosed with malignant pleural effusion, stage IV adenocarcinoma of the lung.  Underwent percutaneous biopsy to obtain additional cellular material for genetics.  Started chemotherapy with Dr. Earlie Server already.  Here today for follow-up regarding malignant effusion.  Recent office note from medical oncology yesterday reviewed.  Plan: Bedside ultrasound today for evaluation of pleural space.  Please see separate procedure note. From a respiratory  standpoint doing okay. If she develops chest tightness wheezing recommended potentially starting use of as needed nebulizer.  At this time overall doing well. Continue routine follow-up with medical oncology. If symptoms progress or patient has worsening shortness of breath and the effusion returns we can consider management options to include repeat drainage versus indwelling pleural catheter placement.  We briefly discussed this today in the office as well. Continue pain management with oncology. Recommended initiation of daily bowel regimen to ensure no constipation.  Return to clinic to see me in 3 to 4 months or as needed.   Current Outpatient Medications:  .  ALPRAZolam (XANAX) 0.25 MG tablet, Take 0.25 mg by mouth daily as needed for anxiety., Disp: , Rfl:  .  Ascorbic Acid (VITAMIN C PO), Take 1 tablet by mouth daily., Disp: , Rfl:  .  aspirin 81 MG tablet, Take 81 mg by mouth daily., Disp: , Rfl:  .  b complex vitamins capsule, Take 1 capsule by mouth 2 (two) times a week., Disp: , Rfl:  .  calcium carbonate (TUMS - DOSED IN MG ELEMENTAL CALCIUM) 500 MG chewable tablet, Chew 500-1,000 mg by mouth daily as needed for indigestion or heartburn., Disp: , Rfl:  .  folic acid (FOLVITE) 1 MG tablet, Take 1 tablet (1 mg total) by mouth daily., Disp: 30 tablet, Rfl: 4 .  hydrochlorothiazide (HYDRODIURIL) 25 MG tablet, Take 25 mg by mouth daily., Disp: , Rfl:  .  metoprolol succinate (TOPROL-XL) 50 MG 24 hr tablet, Take 50 mg by mouth daily. Take with or immediately following a meal., Disp: , Rfl:  .  Multiple Minerals-Vitamins (CALCIUM-MAGNESIUM-ZINC-D3 PO),  Take 1 tablet by mouth 2 (two) times a week., Disp: , Rfl:  .  Multiple Vitamin (MULTIVITAMIN) tablet, Take 1 tablet by mouth 3 (three) times a week., Disp: , Rfl:  .  oxyCODONE-acetaminophen (PERCOCET/ROXICET) 5-325 MG tablet, Take 1 tablet by mouth every 6 (six) hours as needed for severe pain., Disp: 30 tablet, Rfl: 0 .  Polyvinyl  Alcohol-Povidone (REFRESH OP), Place 1 drop into both eyes daily as needed (dry eyes)., Disp: , Rfl:  .  prochlorperazine (COMPAZINE) 10 MG tablet, TAKE 1 TABLET(10 MG) BY MOUTH EVERY 6 HOURS AS NEEDED FOR NAUSEA OR VOMITING, Disp: 30 tablet, Rfl: 0 .  simvastatin (ZOCOR) 20 MG tablet, Take 20 mg by mouth daily., Disp: , Rfl:  .  VITAMIN D PO, Take 1 capsule by mouth 3 (three) times a week., Disp: , Rfl:  .  Vitamin D, Ergocalciferol, (DRISDOL) 1.25 MG (50000 UNIT) CAPS capsule, 1 capsule, Disp: , Rfl:  .  VITAMIN E PO, Take 1 capsule by mouth 2 (two) times a week., Disp: , Rfl:    Garner Nash, DO [facility] 03/19/2020 12:19 PM

## 2020-03-23 ENCOUNTER — Other Ambulatory Visit: Payer: Self-pay | Admitting: Internal Medicine

## 2020-03-24 ENCOUNTER — Inpatient Hospital Stay: Payer: Medicare PPO

## 2020-03-24 ENCOUNTER — Other Ambulatory Visit: Payer: Self-pay

## 2020-03-24 DIAGNOSIS — C3492 Malignant neoplasm of unspecified part of left bronchus or lung: Secondary | ICD-10-CM | POA: Diagnosis not present

## 2020-03-24 DIAGNOSIS — R079 Chest pain, unspecified: Secondary | ICD-10-CM | POA: Diagnosis not present

## 2020-03-24 DIAGNOSIS — Z5111 Encounter for antineoplastic chemotherapy: Secondary | ICD-10-CM | POA: Diagnosis not present

## 2020-03-24 DIAGNOSIS — Z79899 Other long term (current) drug therapy: Secondary | ICD-10-CM | POA: Diagnosis not present

## 2020-03-24 LAB — CMP (CANCER CENTER ONLY)
ALT: 27 U/L (ref 0–44)
AST: 23 U/L (ref 15–41)
Albumin: 3.6 g/dL (ref 3.5–5.0)
Alkaline Phosphatase: 71 U/L (ref 38–126)
Anion gap: 9 (ref 5–15)
BUN: 12 mg/dL (ref 8–23)
CO2: 30 mmol/L (ref 22–32)
Calcium: 9.2 mg/dL (ref 8.9–10.3)
Chloride: 98 mmol/L (ref 98–111)
Creatinine: 0.72 mg/dL (ref 0.44–1.00)
GFR, Estimated: >60 mL/min (ref >60)
Glucose, Bld: 102 mg/dL — ABNORMAL HIGH (ref 70–99)
Potassium: 3.5 mmol/L (ref 3.5–5.1)
Sodium: 137 mmol/L (ref 135–145)
Total Bilirubin: 0.4 mg/dL (ref 0.3–1.2)
Total Protein: 7.4 g/dL (ref 6.5–8.1)

## 2020-03-24 LAB — CBC WITH DIFFERENTIAL (CANCER CENTER ONLY)
Abs Immature Granulocytes: 0.01 10*3/uL (ref 0.00–0.07)
Basophils Absolute: 0 10*3/uL (ref 0.0–0.1)
Basophils Relative: 1 %
Eosinophils Absolute: 0 10*3/uL (ref 0.0–0.5)
Eosinophils Relative: 1 %
HCT: 34.3 % — ABNORMAL LOW (ref 36.0–46.0)
Hemoglobin: 11.4 g/dL — ABNORMAL LOW (ref 12.0–15.0)
Immature Granulocytes: 0 %
Lymphocytes Relative: 18 %
Lymphs Abs: 0.7 10*3/uL (ref 0.7–4.0)
MCH: 28.7 pg (ref 26.0–34.0)
MCHC: 33.2 g/dL (ref 30.0–36.0)
MCV: 86.4 fL (ref 80.0–100.0)
Monocytes Absolute: 0.6 10*3/uL (ref 0.1–1.0)
Monocytes Relative: 17 %
Neutro Abs: 2.5 10*3/uL (ref 1.7–7.7)
Neutrophils Relative %: 63 %
Platelet Count: 248 10*3/uL (ref 150–400)
RBC: 3.97 MIL/uL (ref 3.87–5.11)
RDW: 12.6 % (ref 11.5–15.5)
WBC Count: 3.9 10*3/uL — ABNORMAL LOW (ref 4.0–10.5)
nRBC: 0 % (ref 0.0–0.2)

## 2020-03-31 ENCOUNTER — Encounter: Payer: Self-pay | Admitting: Internal Medicine

## 2020-03-31 ENCOUNTER — Inpatient Hospital Stay: Payer: Medicare PPO

## 2020-03-31 ENCOUNTER — Other Ambulatory Visit: Payer: Self-pay

## 2020-03-31 ENCOUNTER — Inpatient Hospital Stay: Payer: Medicare PPO | Attending: Internal Medicine

## 2020-03-31 ENCOUNTER — Inpatient Hospital Stay (HOSPITAL_BASED_OUTPATIENT_CLINIC_OR_DEPARTMENT_OTHER): Payer: Medicare PPO | Admitting: Internal Medicine

## 2020-03-31 ENCOUNTER — Other Ambulatory Visit: Payer: Self-pay | Admitting: Internal Medicine

## 2020-03-31 VITALS — BP 147/70 | HR 73 | Temp 98.3°F | Resp 20 | Ht 64.0 in | Wt 185.4 lb

## 2020-03-31 DIAGNOSIS — Z5111 Encounter for antineoplastic chemotherapy: Secondary | ICD-10-CM | POA: Insufficient documentation

## 2020-03-31 DIAGNOSIS — Z5112 Encounter for antineoplastic immunotherapy: Secondary | ICD-10-CM | POA: Insufficient documentation

## 2020-03-31 DIAGNOSIS — C3491 Malignant neoplasm of unspecified part of right bronchus or lung: Secondary | ICD-10-CM | POA: Insufficient documentation

## 2020-03-31 DIAGNOSIS — C3492 Malignant neoplasm of unspecified part of left bronchus or lung: Secondary | ICD-10-CM

## 2020-03-31 DIAGNOSIS — Z79899 Other long term (current) drug therapy: Secondary | ICD-10-CM | POA: Insufficient documentation

## 2020-03-31 DIAGNOSIS — I10 Essential (primary) hypertension: Secondary | ICD-10-CM | POA: Diagnosis not present

## 2020-03-31 LAB — CBC WITH DIFFERENTIAL (CANCER CENTER ONLY)
Abs Immature Granulocytes: 0.02 10*3/uL (ref 0.00–0.07)
Basophils Absolute: 0 10*3/uL (ref 0.0–0.1)
Basophils Relative: 1 %
Eosinophils Absolute: 0 10*3/uL (ref 0.0–0.5)
Eosinophils Relative: 0 %
HCT: 35.4 % — ABNORMAL LOW (ref 36.0–46.0)
Hemoglobin: 11.9 g/dL — ABNORMAL LOW (ref 12.0–15.0)
Immature Granulocytes: 0 %
Lymphocytes Relative: 15 %
Lymphs Abs: 0.9 10*3/uL (ref 0.7–4.0)
MCH: 29.2 pg (ref 26.0–34.0)
MCHC: 33.6 g/dL (ref 30.0–36.0)
MCV: 87 fL (ref 80.0–100.0)
Monocytes Absolute: 0.7 10*3/uL (ref 0.1–1.0)
Monocytes Relative: 12 %
Neutro Abs: 4.3 10*3/uL (ref 1.7–7.7)
Neutrophils Relative %: 72 %
Platelet Count: 443 10*3/uL — ABNORMAL HIGH (ref 150–400)
RBC: 4.07 MIL/uL (ref 3.87–5.11)
RDW: 13.4 % (ref 11.5–15.5)
WBC Count: 6 10*3/uL (ref 4.0–10.5)
nRBC: 0 % (ref 0.0–0.2)

## 2020-03-31 LAB — CMP (CANCER CENTER ONLY)
ALT: 20 U/L (ref 0–44)
AST: 20 U/L (ref 15–41)
Albumin: 3.6 g/dL (ref 3.5–5.0)
Alkaline Phosphatase: 76 U/L (ref 38–126)
Anion gap: 7 (ref 5–15)
BUN: 12 mg/dL (ref 8–23)
CO2: 26 mmol/L (ref 22–32)
Calcium: 9.3 mg/dL (ref 8.9–10.3)
Chloride: 103 mmol/L (ref 98–111)
Creatinine: 0.67 mg/dL (ref 0.44–1.00)
GFR, Estimated: >60 mL/min (ref >60)
Glucose, Bld: 108 mg/dL — ABNORMAL HIGH (ref 70–99)
Potassium: 4 mmol/L (ref 3.5–5.1)
Sodium: 136 mmol/L (ref 135–145)
Total Bilirubin: 0.4 mg/dL (ref 0.3–1.2)
Total Protein: 7.2 g/dL (ref 6.5–8.1)

## 2020-03-31 LAB — TSH: TSH: 2.472 u[IU]/mL (ref 0.308–3.960)

## 2020-03-31 MED ORDER — PALONOSETRON HCL INJECTION 0.25 MG/5ML
0.2500 mg | Freq: Once | INTRAVENOUS | Status: AC
  Administered 2020-03-31: 0.25 mg via INTRAVENOUS

## 2020-03-31 MED ORDER — PALONOSETRON HCL INJECTION 0.25 MG/5ML
INTRAVENOUS | Status: AC
  Filled 2020-03-31: qty 5

## 2020-03-31 MED ORDER — SODIUM CHLORIDE 0.9 % IV SOLN
200.0000 mg | Freq: Once | INTRAVENOUS | Status: AC
  Administered 2020-03-31: 200 mg via INTRAVENOUS
  Filled 2020-03-31: qty 8

## 2020-03-31 MED ORDER — SODIUM CHLORIDE 0.9% FLUSH
10.0000 mL | INTRAVENOUS | Status: DC | PRN
  Filled 2020-03-31: qty 10

## 2020-03-31 MED ORDER — SODIUM CHLORIDE 0.9 % IV SOLN
10.0000 mg | Freq: Once | INTRAVENOUS | Status: AC
  Administered 2020-03-31: 10 mg via INTRAVENOUS
  Filled 2020-03-31: qty 10

## 2020-03-31 MED ORDER — MIRTAZAPINE 30 MG PO TABS: 30.0000 mg | ORAL_TABLET | Freq: Every day | ORAL | 2 refills | Status: DC

## 2020-03-31 MED ORDER — SODIUM CHLORIDE 0.9 % IV SOLN
500.0000 mg/m2 | Freq: Once | INTRAVENOUS | Status: AC
  Administered 2020-03-31: 1000 mg via INTRAVENOUS
  Filled 2020-03-31: qty 40

## 2020-03-31 MED ORDER — SODIUM CHLORIDE 0.9 % IV SOLN
150.0000 mg | Freq: Once | INTRAVENOUS | Status: AC
  Administered 2020-03-31: 150 mg via INTRAVENOUS
  Filled 2020-03-31: qty 150

## 2020-03-31 MED ORDER — SODIUM CHLORIDE 0.9 % IV SOLN
477.5000 mg | Freq: Once | INTRAVENOUS | Status: AC
  Administered 2020-03-31: 480 mg via INTRAVENOUS
  Filled 2020-03-31: qty 48

## 2020-03-31 MED ORDER — SODIUM CHLORIDE 0.9 % IV SOLN
Freq: Once | INTRAVENOUS | Status: AC
  Filled 2020-03-31: qty 250

## 2020-03-31 MED FILL — MIRTAZAPINE 30 MG TABLET: 30 | 30 days supply | Qty: 30 | Fill #0

## 2020-03-31 NOTE — Patient Instructions (Signed)
[facility] Discharge Instructions for Patients Receiving Chemotherapy  Today you received the following chemotherapy agents Pembrolizumab, pemetrexed, carboplatin  To help prevent nausea and vomiting after your treatment, we encourage you to take your nausea medication as directed.  If you develop nausea and vomiting that is not controlled by your nausea medication, call the clinic.   BELOW ARE SYMPTOMS THAT SHOULD BE REPORTED IMMEDIATELY:  *FEVER GREATER THAN 100.5 F  *CHILLS WITH OR WITHOUT FEVER  NAUSEA AND VOMITING THAT IS NOT CONTROLLED WITH YOUR NAUSEA MEDICATION  *UNUSUAL SHORTNESS OF BREATH  *UNUSUAL BRUISING OR BLEEDING  TENDERNESS IN MOUTH AND THROAT WITH OR WITHOUT PRESENCE OF ULCERS  *URINARY PROBLEMS  *BOWEL PROBLEMS  UNUSUAL RASH Items with * indicate a potential emergency and should be followed up as soon as possible.  Feel free to call the clinic should you have any questions or concerns. The clinic phone number is [phone].  Please show the [facility] at check-in to the Emergency Department and triage nurse.

## 2020-03-31 NOTE — Progress Notes (Signed)
[facility] [phone]   [phone]  OFFICE PROGRESS NOTE  [name], MD [address] [place] [address]  DIAGNOSIS: stage IV (T1b, N1, M1 a) non-small cell lung cancer favoring adenocarcinoma diagnosed in October 2021.  The pathologic immunohistochemical stains suggestive of upper GI primary but there is no finding on the PET scan or imaging study to support this possibility.  Biomarker Findings Microsatellite status - MS-Stable Tumor Mutational Burden - 3 Muts/Mb Genomic Findings For a complete list of the genes assayed, please refer to the Appendix. KRAS G12C STK11 E145* 7 Disease relevant genes with no reportable alterations: ALK, BRAF, EGFR, ERBB2, MET, RET, ROS1  PDL1 Expression 0%  PRIOR THERAPY: None  CURRENT THERAPY: Systemic chemotherapy with carboplatin for AUC of 5, Alimta 500 mg/M2 and Keytruda 200 mg IV every 3 weeks.  First dose March 11, 2019.  Status post 1 cycle.  INTERVAL HISTORY: Cindy [name] 75 y.o. female returns to the clinic today for follow-up visit.  The patient is feeling fine today with no concerning complaints except for lack of appetite and depression.  She is not currently on any antidepressant.  The patient has mild left-sided chest pain which is better compared to few weeks ago.  She takes only half a tablet of oxycodone on as-needed basis.  She denied having any shortness of breath, cough or hemoptysis.  She has no nausea, vomiting, diarrhea and her constipation is better with the MiraLAX.  The patient denied having any headache or visual changes.  She is here today for evaluation before starting cycle #2 of her chemotherapy.  MEDICAL HISTORY: Past Medical History:  Diagnosis Date  . Anxiety   . Depression   . Hyperlipidemia   . Hypertension     ALLERGIES:  is allergic to escitalopram, povidone iodine, and procaine.  MEDICATIONS:  Current Outpatient Medications   Medication Sig Dispense Refill  . ALPRAZolam (XANAX) 0.25 MG tablet Take 0.25 mg by mouth daily as needed for anxiety.    . Ascorbic Acid (VITAMIN C PO) Take 1 tablet by mouth daily.    Marland Kitchen aspirin 81 MG tablet Take 81 mg by mouth daily.    Marland Kitchen b complex vitamins capsule Take 1 capsule by mouth 2 (two) times a week.    . calcium carbonate (TUMS - DOSED IN MG ELEMENTAL CALCIUM) 500 MG chewable tablet Chew 500-1,000 mg by mouth daily as needed for indigestion or heartburn.    . folic acid (FOLVITE) 1 MG tablet Take 1 tablet (1 mg total) by mouth daily. 30 tablet 4  . hydrochlorothiazide (HYDRODIURIL) 25 MG tablet Take 25 mg by mouth daily.    . metoprolol succinate (TOPROL-XL) 50 MG 24 hr tablet Take 50 mg by mouth daily. Take with or immediately following a meal.    . Multiple Minerals-Vitamins (CALCIUM-MAGNESIUM-ZINC-D3 PO) Take 1 tablet by mouth 2 (two) times a week.    . Multiple Vitamin (MULTIVITAMIN) tablet Take 1 tablet by mouth 3 (three) times a week.    Marland Kitchen oxyCODONE-acetaminophen (PERCOCET/ROXICET) 5-325 MG tablet Take 1 tablet by mouth every 6 (six) hours as needed for severe pain. 30 tablet 0  . Polyvinyl Alcohol-Povidone (REFRESH OP) Place 1 drop into both eyes daily as needed (dry eyes).    . prochlorperazine (COMPAZINE) 10 MG tablet TAKE 1 TABLET(10 MG) BY MOUTH EVERY 6 HOURS AS NEEDED FOR NAUSEA OR VOMITING 30 tablet 0  . simvastatin (ZOCOR) 20 MG tablet Take  20 mg by mouth daily.    Marland Kitchen VITAMIN D PO Take 1 capsule by mouth 3 (three) times a week.    . Vitamin D, Ergocalciferol, (DRISDOL) 1.25 MG (50000 UNIT) CAPS capsule 1 capsule    . VITAMIN E PO Take 1 capsule by mouth 2 (two) times a week.     No current facility-administered medications for this visit.    SURGICAL HISTORY:  Past Surgical History:  Procedure Laterality Date  . DENTAL SURGERY  2012 - 2013   implants  . IR THORACENTESIS ASP PLEURAL SPACE W/IMG GUIDE  12/19/2019  . WISDOM TOOTH EXTRACTION  20    REVIEW OF  SYSTEMS:  Constitutional: positive for fatigue and weight loss Eyes: negative Ears, nose, mouth, throat, and face: negative Respiratory: positive for dyspnea on exertion and pleurisy/chest pain Cardiovascular: negative Gastrointestinal: positive for constipation Genitourinary:negative Integument/breast: negative Hematologic/lymphatic: negative Musculoskeletal:negative Neurological: negative Behavioral/Psych: negative Endocrine: negative Allergic/Immunologic: negative   PHYSICAL EXAMINATION: General appearance: alert, cooperative, fatigued and no distress Head: Normocephalic, without obvious abnormality, atraumatic Neck: no adenopathy, no JVD, supple, symmetrical, trachea midline and thyroid not enlarged, symmetric, no tenderness/mass/nodules Lymph nodes: Cervical, supraclavicular, and axillary nodes normal. Resp: clear to auscultation bilaterally Back: symmetric, no curvature. ROM normal. No CVA tenderness. Cardio: regular rate and rhythm, S1, S2 normal, no murmur, click, rub or gallop GI: soft, non-tender; bowel sounds normal; no masses,  no organomegaly Extremities: extremities normal, atraumatic, no cyanosis or edema Neurologic: Alert and oriented X 3, normal strength and tone. Normal symmetric reflexes. Normal coordination and gait  ECOG PERFORMANCE STATUS: 1 - Symptomatic but completely ambulatory  Blood pressure (!) 147/70, pulse 73, temperature 98.3 F (36.8 C), temperature source Tympanic, resp. rate 20, height $RemoveBe'5\' 4"'xZcFHqaDX$  (1.626 m), weight 185 lb 6.4 oz (84.1 kg), last menstrual period 09/29/2002, SpO2 97 %.  LABORATORY DATA: Lab Results  Component Value Date   WBC 6.0 03/31/2020   HGB 11.9 (L) 03/31/2020   HCT 35.4 (L) 03/31/2020   MCV 87.0 03/31/2020   PLT 443 (H) 03/31/2020      Chemistry      Component Value Date/Time   NA 137 03/24/2020 1046   K 3.5 03/24/2020 1046   CL 98 03/24/2020 1046   CO2 30 03/24/2020 1046   BUN 12 03/24/2020 1046   CREATININE 0.72  03/24/2020 1046      Component Value Date/Time   CALCIUM 9.2 03/24/2020 1046   ALKPHOS 71 03/24/2020 1046   AST 23 03/24/2020 1046   ALT 27 03/24/2020 1046   BILITOT 0.4 03/24/2020 1046       RADIOGRAPHIC STUDIES: No results found.  ASSESSMENT AND PLAN: This is a very pleasant 75 years old white female recently diagnosed with a stage IV (T1b, N2, M1 a) non-small cell lung cancer, adenocarcinoma with no actionable mutations based on the molecular studies by [facility]. The patient underwent CT-guided core biopsy of one of the left lung pleural-based nodules by interventional radiology and the final pathology was consistent with metastatic adenocarcinoma. Her tissue block was sent to [facility] for molecular studies and PD-L1 expression.  The molecular studies by [facility] showed positive K-ras G12C mutation which will be an option for targeted therapy in the second line setting with Lumakras (Sotorasib). The patient is currently undergoing palliative systemic chemotherapy with carboplatin for AUC of 5, Alimta 500 mg/M2 and Keytruda 200 mg IV every 3 weeks.  Status post 1 cycle.  She tolerated the last 2 weeks of her treatment  much better. I recommended for her to proceed with cycle #2 today as planned. For the lack of appetite and depression, I will start the patient on Remeron 30 mg p.o. nightly. For pain management she will continue her current treatment with Percocet on as-needed basis. The patient was advised to call immediately if she has any other concerning symptoms in the interval. The patient voices understanding of current disease status and treatment options and is in agreement with the current care plan.  All questions were answered. The patient knows to call the clinic with any problems, questions or concerns. We can certainly see the patient much sooner if necessary.  Disclaimer: This note was dictated with voice recognition software. Similar sounding words can  inadvertently be transcribed and may not be corrected upon review.

## 2020-04-02 ENCOUNTER — Telehealth: Payer: Self-pay | Admitting: Internal Medicine

## 2020-04-02 NOTE — Telephone Encounter (Signed)
Per 2/1 los, next appts already scheduled

## 2020-04-07 ENCOUNTER — Inpatient Hospital Stay: Payer: Medicare PPO

## 2020-04-07 ENCOUNTER — Other Ambulatory Visit: Payer: Self-pay

## 2020-04-07 ENCOUNTER — Other Ambulatory Visit: Payer: Medicare PPO

## 2020-04-07 ENCOUNTER — Encounter: Payer: Self-pay | Admitting: Internal Medicine

## 2020-04-07 DIAGNOSIS — Z79899 Other long term (current) drug therapy: Secondary | ICD-10-CM | POA: Diagnosis not present

## 2020-04-07 DIAGNOSIS — C3491 Malignant neoplasm of unspecified part of right bronchus or lung: Secondary | ICD-10-CM | POA: Diagnosis not present

## 2020-04-07 DIAGNOSIS — Z5111 Encounter for antineoplastic chemotherapy: Secondary | ICD-10-CM | POA: Diagnosis not present

## 2020-04-07 DIAGNOSIS — C3492 Malignant neoplasm of unspecified part of left bronchus or lung: Secondary | ICD-10-CM

## 2020-04-07 DIAGNOSIS — Z5112 Encounter for antineoplastic immunotherapy: Secondary | ICD-10-CM | POA: Diagnosis not present

## 2020-04-07 LAB — CMP (CANCER CENTER ONLY)
ALT: 21 U/L (ref 0–44)
AST: 21 U/L (ref 15–41)
Albumin: 3.8 g/dL (ref 3.5–5.0)
Alkaline Phosphatase: 72 U/L (ref 38–126)
Anion gap: 8 (ref 5–15)
BUN: 13 mg/dL (ref 8–23)
CO2: 30 mmol/L (ref 22–32)
Calcium: 9.4 mg/dL (ref 8.9–10.3)
Chloride: 98 mmol/L (ref 98–111)
Creatinine: 0.71 mg/dL (ref 0.44–1.00)
GFR, Estimated: >60 mL/min (ref >60)
Glucose, Bld: 104 mg/dL — ABNORMAL HIGH (ref 70–99)
Potassium: 3.7 mmol/L (ref 3.5–5.1)
Sodium: 136 mmol/L (ref 135–145)
Total Bilirubin: 0.5 mg/dL (ref 0.3–1.2)
Total Protein: 7.2 g/dL (ref 6.5–8.1)

## 2020-04-07 LAB — CBC WITH DIFFERENTIAL (CANCER CENTER ONLY)
Abs Immature Granulocytes: 0.01 10*3/uL (ref 0.00–0.07)
Basophils Absolute: 0 10*3/uL (ref 0.0–0.1)
Basophils Relative: 1 %
Eosinophils Absolute: 0 10*3/uL (ref 0.0–0.5)
Eosinophils Relative: 1 %
HCT: 35.3 % — ABNORMAL LOW (ref 36.0–46.0)
Hemoglobin: 12.1 g/dL (ref 12.0–15.0)
Immature Granulocytes: 0 %
Lymphocytes Relative: 20 %
Lymphs Abs: 0.7 10*3/uL (ref 0.7–4.0)
MCH: 29 pg (ref 26.0–34.0)
MCHC: 34.3 g/dL (ref 30.0–36.0)
MCV: 84.7 fL (ref 80.0–100.0)
Monocytes Absolute: 0.2 10*3/uL (ref 0.1–1.0)
Monocytes Relative: 6 %
Neutro Abs: 2.4 10*3/uL (ref 1.7–7.7)
Neutrophils Relative %: 72 %
Platelet Count: 171 10*3/uL (ref 150–400)
RBC: 4.17 MIL/uL (ref 3.87–5.11)
RDW: 13 % (ref 11.5–15.5)
WBC Count: 3.3 10*3/uL — ABNORMAL LOW (ref 4.0–10.5)
nRBC: 0 % (ref 0.0–0.2)

## 2020-04-07 NOTE — Progress Notes (Signed)
Met with patient at registration to introduce myself as Arboriculturist and to offer available resources.  Discussed one-time $1000 Radio broadcast assistant to assist with personal expenses while going through treatment. Also, advised if there is any available copay assistance, I would be the one to reach out to her and apply on her behalf. She verbalized understanding,  She has my card for any additional financial questions or concerns.

## 2020-04-14 ENCOUNTER — Other Ambulatory Visit: Payer: Self-pay

## 2020-04-14 ENCOUNTER — Other Ambulatory Visit: Payer: Medicare PPO

## 2020-04-14 ENCOUNTER — Inpatient Hospital Stay: Payer: Medicare PPO

## 2020-04-14 DIAGNOSIS — Z5112 Encounter for antineoplastic immunotherapy: Secondary | ICD-10-CM | POA: Diagnosis not present

## 2020-04-14 DIAGNOSIS — C3492 Malignant neoplasm of unspecified part of left bronchus or lung: Secondary | ICD-10-CM

## 2020-04-14 DIAGNOSIS — C3491 Malignant neoplasm of unspecified part of right bronchus or lung: Secondary | ICD-10-CM | POA: Diagnosis not present

## 2020-04-14 DIAGNOSIS — Z5111 Encounter for antineoplastic chemotherapy: Secondary | ICD-10-CM | POA: Diagnosis not present

## 2020-04-14 DIAGNOSIS — Z79899 Other long term (current) drug therapy: Secondary | ICD-10-CM | POA: Diagnosis not present

## 2020-04-14 LAB — CBC WITH DIFFERENTIAL (CANCER CENTER ONLY)
Abs Immature Granulocytes: 0.01 10*3/uL (ref 0.00–0.07)
Basophils Absolute: 0 10*3/uL (ref 0.0–0.1)
Basophils Relative: 0 %
Eosinophils Absolute: 0 10*3/uL (ref 0.0–0.5)
Eosinophils Relative: 1 %
HCT: 32.4 % — ABNORMAL LOW (ref 36.0–46.0)
Hemoglobin: 11.1 g/dL — ABNORMAL LOW (ref 12.0–15.0)
Immature Granulocytes: 0 %
Lymphocytes Relative: 20 %
Lymphs Abs: 0.8 10*3/uL (ref 0.7–4.0)
MCH: 29.4 pg (ref 26.0–34.0)
MCHC: 34.3 g/dL (ref 30.0–36.0)
MCV: 85.7 fL (ref 80.0–100.0)
Monocytes Absolute: 0.5 10*3/uL (ref 0.1–1.0)
Monocytes Relative: 13 %
Neutro Abs: 2.5 10*3/uL (ref 1.7–7.7)
Neutrophils Relative %: 66 %
Platelet Count: 170 10*3/uL (ref 150–400)
RBC: 3.78 MIL/uL — ABNORMAL LOW (ref 3.87–5.11)
RDW: 13.1 % (ref 11.5–15.5)
WBC Count: 3.8 10*3/uL — ABNORMAL LOW (ref 4.0–10.5)
nRBC: 0 % (ref 0.0–0.2)

## 2020-04-14 LAB — CMP (CANCER CENTER ONLY)
ALT: 32 U/L (ref 0–44)
AST: 30 U/L (ref 15–41)
Albumin: 3.6 g/dL (ref 3.5–5.0)
Alkaline Phosphatase: 70 U/L (ref 38–126)
Anion gap: 9 (ref 5–15)
BUN: 9 mg/dL (ref 8–23)
CO2: 28 mmol/L (ref 22–32)
Calcium: 9.2 mg/dL (ref 8.9–10.3)
Chloride: 100 mmol/L (ref 98–111)
Creatinine: 0.73 mg/dL (ref 0.44–1.00)
GFR, Estimated: >60 mL/min (ref >60)
Glucose, Bld: 114 mg/dL — ABNORMAL HIGH (ref 70–99)
Potassium: 3.8 mmol/L (ref 3.5–5.1)
Sodium: 137 mmol/L (ref 135–145)
Total Bilirubin: 0.4 mg/dL (ref 0.3–1.2)
Total Protein: 7.2 g/dL (ref 6.5–8.1)

## 2020-04-17 ENCOUNTER — Encounter: Payer: Self-pay | Admitting: Internal Medicine

## 2020-04-17 NOTE — Progress Notes (Signed)
Patient called to discuss Pharmacologist. Went over and advised what is needed to apply.  Advised to bring on 04/21/20 if she meets the income guidelines. She verbalized understanding.  She has my card for any additional financial questions or concerns.

## 2020-04-21 ENCOUNTER — Inpatient Hospital Stay: Payer: Medicare PPO | Admitting: Internal Medicine

## 2020-04-21 ENCOUNTER — Inpatient Hospital Stay: Payer: Medicare PPO

## 2020-04-21 ENCOUNTER — Encounter: Payer: Self-pay | Admitting: Internal Medicine

## 2020-04-21 ENCOUNTER — Other Ambulatory Visit: Payer: Self-pay

## 2020-04-21 VITALS — BP 150/65 | HR 71 | Temp 99.1°F | Resp 16 | Ht 64.0 in | Wt 184.4 lb

## 2020-04-21 DIAGNOSIS — F32A Depression, unspecified: Secondary | ICD-10-CM

## 2020-04-21 DIAGNOSIS — C3492 Malignant neoplasm of unspecified part of left bronchus or lung: Secondary | ICD-10-CM

## 2020-04-21 DIAGNOSIS — Z79899 Other long term (current) drug therapy: Secondary | ICD-10-CM | POA: Diagnosis not present

## 2020-04-21 DIAGNOSIS — Z5111 Encounter for antineoplastic chemotherapy: Secondary | ICD-10-CM | POA: Diagnosis not present

## 2020-04-21 DIAGNOSIS — R630 Anorexia: Secondary | ICD-10-CM | POA: Diagnosis not present

## 2020-04-21 DIAGNOSIS — C3490 Malignant neoplasm of unspecified part of unspecified bronchus or lung: Secondary | ICD-10-CM

## 2020-04-21 DIAGNOSIS — Z5112 Encounter for antineoplastic immunotherapy: Secondary | ICD-10-CM | POA: Diagnosis not present

## 2020-04-21 DIAGNOSIS — C3491 Malignant neoplasm of unspecified part of right bronchus or lung: Secondary | ICD-10-CM | POA: Diagnosis not present

## 2020-04-21 LAB — CBC WITH DIFFERENTIAL (CANCER CENTER ONLY)
Abs Immature Granulocytes: 0.01 10*3/uL (ref 0.00–0.07)
Basophils Absolute: 0 10*3/uL (ref 0.0–0.1)
Basophils Relative: 1 %
Eosinophils Absolute: 0 10*3/uL (ref 0.0–0.5)
Eosinophils Relative: 1 %
HCT: 34.2 % — ABNORMAL LOW (ref 36.0–46.0)
Hemoglobin: 11.5 g/dL — ABNORMAL LOW (ref 12.0–15.0)
Immature Granulocytes: 0 %
Lymphocytes Relative: 17 %
Lymphs Abs: 0.9 10*3/uL (ref 0.7–4.0)
MCH: 29.5 pg (ref 26.0–34.0)
MCHC: 33.6 g/dL (ref 30.0–36.0)
MCV: 87.7 fL (ref 80.0–100.0)
Monocytes Absolute: 0.6 10*3/uL (ref 0.1–1.0)
Monocytes Relative: 10 %
Neutro Abs: 3.8 10*3/uL (ref 1.7–7.7)
Neutrophils Relative %: 71 %
Platelet Count: 420 10*3/uL — ABNORMAL HIGH (ref 150–400)
RBC: 3.9 MIL/uL (ref 3.87–5.11)
RDW: 14.7 % (ref 11.5–15.5)
WBC Count: 5.4 10*3/uL (ref 4.0–10.5)
nRBC: 0 % (ref 0.0–0.2)

## 2020-04-21 LAB — CMP (CANCER CENTER ONLY)
ALT: 26 U/L (ref 0–44)
AST: 23 U/L (ref 15–41)
Albumin: 3.7 g/dL (ref 3.5–5.0)
Alkaline Phosphatase: 75 U/L (ref 38–126)
Anion gap: 8 (ref 5–15)
BUN: 11 mg/dL (ref 8–23)
CO2: 25 mmol/L (ref 22–32)
Calcium: 9.1 mg/dL (ref 8.9–10.3)
Chloride: 104 mmol/L (ref 98–111)
Creatinine: 0.67 mg/dL (ref 0.44–1.00)
GFR, Estimated: >60 mL/min (ref >60)
Glucose, Bld: 128 mg/dL — ABNORMAL HIGH (ref 70–99)
Potassium: 3.8 mmol/L (ref 3.5–5.1)
Sodium: 137 mmol/L (ref 135–145)
Total Bilirubin: 0.3 mg/dL (ref 0.3–1.2)
Total Protein: 7.3 g/dL (ref 6.5–8.1)

## 2020-04-21 LAB — TSH: TSH: 2.364 u[IU]/mL (ref 0.308–3.960)

## 2020-04-21 MED ORDER — SODIUM CHLORIDE 0.9 % IV SOLN
500.0000 mg/m2 | Freq: Once | INTRAVENOUS | Status: AC
  Administered 2020-04-21: 1000 mg via INTRAVENOUS
  Filled 2020-04-21: qty 40

## 2020-04-21 MED ORDER — CYANOCOBALAMIN 1000 MCG/ML IJ SOLN
INTRAMUSCULAR | Status: AC
  Filled 2020-04-21: qty 1

## 2020-04-21 MED ORDER — SODIUM CHLORIDE 0.9 % IV SOLN
477.5000 mg | Freq: Once | INTRAVENOUS | Status: AC
  Administered 2020-04-21: 480 mg via INTRAVENOUS
  Filled 2020-04-21: qty 48

## 2020-04-21 MED ORDER — SODIUM CHLORIDE 0.9 % IV SOLN
10.0000 mg | Freq: Once | INTRAVENOUS | Status: AC
  Administered 2020-04-21: 10 mg via INTRAVENOUS
  Filled 2020-04-21: qty 10

## 2020-04-21 MED ORDER — SODIUM CHLORIDE 0.9 % IV SOLN
Freq: Once | INTRAVENOUS | Status: AC
  Filled 2020-04-21: qty 250

## 2020-04-21 MED ORDER — CYANOCOBALAMIN 1000 MCG/ML IJ SOLN
1000.0000 ug | Freq: Once | INTRAMUSCULAR | Status: AC
  Administered 2020-04-21: 1000 ug via INTRAMUSCULAR

## 2020-04-21 MED ORDER — SODIUM CHLORIDE 0.9 % IV SOLN
200.0000 mg | Freq: Once | INTRAVENOUS | Status: AC
  Administered 2020-04-21: 200 mg via INTRAVENOUS
  Filled 2020-04-21: qty 8

## 2020-04-21 MED ORDER — SODIUM CHLORIDE 0.9 % IV SOLN
150.0000 mg | Freq: Once | INTRAVENOUS | Status: AC
  Administered 2020-04-21: 150 mg via INTRAVENOUS
  Filled 2020-04-21: qty 150

## 2020-04-21 MED ORDER — PALONOSETRON HCL INJECTION 0.25 MG/5ML
INTRAVENOUS | Status: AC
  Filled 2020-04-21: qty 5

## 2020-04-21 MED ORDER — PALONOSETRON HCL INJECTION 0.25 MG/5ML
0.2500 mg | Freq: Once | INTRAVENOUS | Status: AC
  Administered 2020-04-21: 0.25 mg via INTRAVENOUS

## 2020-04-21 NOTE — Patient Instructions (Signed)
[facility] Discharge Instructions for Patients Receiving Chemotherapy  Today you received the following chemotherapy agents keytruda, alimta, carboplatin  To help prevent nausea and vomiting after your treatment, we encourage you to take your nausea medication as directed.   If you develop nausea and vomiting that is not controlled by your nausea medication, call the clinic.   BELOW ARE SYMPTOMS THAT SHOULD BE REPORTED IMMEDIATELY:  *FEVER GREATER THAN 100.5 F  *CHILLS WITH OR WITHOUT FEVER  NAUSEA AND VOMITING THAT IS NOT CONTROLLED WITH YOUR NAUSEA MEDICATION  *UNUSUAL SHORTNESS OF BREATH  *UNUSUAL BRUISING OR BLEEDING  TENDERNESS IN MOUTH AND THROAT WITH OR WITHOUT PRESENCE OF ULCERS  *URINARY PROBLEMS  *BOWEL PROBLEMS  UNUSUAL RASH Items with * indicate a potential emergency and should be followed up as soon as possible.  Feel free to call the clinic should you have any questions or concerns. The clinic phone number is [phone].  Please show the [place] [facility] at check-in to the Emergency Department and triage nurse.

## 2020-04-21 NOTE — Addendum Note (Signed)
Addended by: Ardeen Garland on: 04/21/2020 12:04 PM   Modules accepted: Orders

## 2020-04-21 NOTE — Progress Notes (Signed)
[facility] Telephone:([phone]   Fax:(336) [phone]  OFFICE PROGRESS NOTE  [name], MD [place] [address] [place] [address]  DIAGNOSIS: stage IV (T1b, N1, M1 a) non-small cell lung cancer favoring adenocarcinoma diagnosed in October 2021.  The pathologic immunohistochemical stains suggestive of upper GI primary but there is no finding on the PET scan or imaging study to support this possibility.  Biomarker Findings Microsatellite status - MS-Stable Tumor Mutational Burden - 3 Muts/Mb Genomic Findings For a complete list of the genes assayed, please refer to the Appendix. KRAS G12C STK11 E145* 7 Disease relevant genes with no reportable alterations: ALK, BRAF, EGFR, ERBB2, MET, RET, ROS1  PDL1 Expression 0%  PRIOR THERAPY: None  CURRENT THERAPY: Systemic chemotherapy with carboplatin for AUC of 5, Alimta 500 mg/M2 and Keytruda 200 mg IV every 3 weeks.  First dose March 11, 2019.  Status post 2 cycles.  INTERVAL HISTORY: Cindy [name] 75 y.o. female returns to the clinic today for follow-up visit.  The patient is feeling fine today with no concerning complaints except for intermittent left-sided chest pain.  She also has some weakness in the left upper thigh in the last 2 weeks and she was seen by her massage therapist and was suspicious for inflammation of the psoas muscle.  The patient takes Tylenol with improvement of her condition.  She denied having any shortness of breath, cough or hemoptysis.  She denied having any fever or chills.  She has no nausea, vomiting, diarrhea or constipation.  She denied having any headache or visual changes.  She was a started on Remeron for depression and appetite but she could not take it after the second dose because she slept a lot.  She is here today for evaluation before starting cycle #3 of her treatment.  MEDICAL HISTORY: Past Medical History:  Diagnosis Date  . Anxiety   . Depression   .  Hyperlipidemia   . Hypertension     ALLERGIES:  is allergic to escitalopram, povidone iodine, and procaine.  MEDICATIONS:  Current Outpatient Medications  Medication Sig Dispense Refill  . ALPRAZolam (XANAX) 0.25 MG tablet Take 0.25 mg by mouth daily as needed for anxiety.    . Ascorbic Acid (VITAMIN C PO) Take 1 tablet by mouth daily.    Marland Kitchen aspirin 81 MG tablet Take 81 mg by mouth daily.    Marland Kitchen b complex vitamins capsule Take 1 capsule by mouth 2 (two) times a week.    . calcium carbonate (TUMS - DOSED IN MG ELEMENTAL CALCIUM) 500 MG chewable tablet Chew 500-1,000 mg by mouth daily as needed for indigestion or heartburn.    . folic acid (FOLVITE) 1 MG tablet Take 1 tablet (1 mg total) by mouth daily. 30 tablet 4  . hydrochlorothiazide (HYDRODIURIL) 25 MG tablet Take 25 mg by mouth daily.    . metoprolol succinate (TOPROL-XL) 50 MG 24 hr tablet Take 50 mg by mouth daily. Take with or immediately following a meal.    . mirtazapine (REMERON) 30 MG tablet Take 1 tablet (30 mg total) by mouth at bedtime. 30 tablet 2  . Multiple Minerals-Vitamins (CALCIUM-MAGNESIUM-ZINC-D3 PO) Take 1 tablet by mouth 2 (two) times a week.    . Multiple Vitamin (MULTIVITAMIN) tablet Take 1 tablet by mouth 3 (three) times a week.    Marland Kitchen oxyCODONE-acetaminophen (PERCOCET/ROXICET) 5-325 MG tablet Take 1 tablet by mouth every 6 (six) hours as needed for severe pain. 30 tablet  0  . Polyvinyl Alcohol-Povidone (REFRESH OP) Place 1 drop into both eyes daily as needed (dry eyes).    . prochlorperazine (COMPAZINE) 10 MG tablet TAKE 1 TABLET(10 MG) BY MOUTH EVERY 6 HOURS AS NEEDED FOR NAUSEA OR VOMITING 30 tablet 0  . simvastatin (ZOCOR) 20 MG tablet Take 20 mg by mouth daily.    Marland Kitchen VITAMIN D PO Take 1 capsule by mouth 3 (three) times a week.    . Vitamin D, Ergocalciferol, (DRISDOL) 1.25 MG (50000 UNIT) CAPS capsule 1 capsule    . VITAMIN E PO Take 1 capsule by mouth 2 (two) times a week.     No current facility-administered  medications for this visit.    SURGICAL HISTORY:  Past Surgical History:  Procedure Laterality Date  . DENTAL SURGERY  2012 - 2013   implants  . IR THORACENTESIS ASP PLEURAL SPACE W/IMG GUIDE  12/19/2019  . WISDOM TOOTH EXTRACTION  20    REVIEW OF SYSTEMS:  A comprehensive review of systems was negative except for: Constitutional: positive for fatigue Respiratory: positive for pleurisy/chest pain Musculoskeletal: positive for muscle weakness   PHYSICAL EXAMINATION: General appearance: alert, cooperative, fatigued and no distress Head: Normocephalic, without obvious abnormality, atraumatic Neck: no adenopathy, no JVD, supple, symmetrical, trachea midline and thyroid not enlarged, symmetric, no tenderness/mass/nodules Lymph nodes: Cervical, supraclavicular, and axillary nodes normal. Resp: clear to auscultation bilaterally Back: symmetric, no curvature. ROM normal. No CVA tenderness. Cardio: regular rate and rhythm, S1, S2 normal, no murmur, click, rub or gallop GI: soft, non-tender; bowel sounds normal; no masses,  no organomegaly Extremities: extremities normal, atraumatic, no cyanosis or edema  ECOG PERFORMANCE STATUS: 1 - Symptomatic but completely ambulatory  Blood pressure (!) 150/65, pulse 71, temperature 99.1 F (37.3 C), temperature source Tympanic, resp. rate 16, height $RemoveBe'5\' 4"'fshyhNYcw$  (1.626 m), weight 184 lb 6.4 oz (83.6 kg), last menstrual period 09/29/2002, SpO2 97 %.  LABORATORY DATA: Lab Results  Component Value Date   WBC 5.4 04/21/2020   HGB 11.5 (L) 04/21/2020   HCT 34.2 (L) 04/21/2020   MCV 87.7 04/21/2020   PLT 420 (H) 04/21/2020      Chemistry      Component Value Date/Time   NA 137 04/14/2020 1053   K 3.8 04/14/2020 1053   CL 100 04/14/2020 1053   CO2 28 04/14/2020 1053   BUN 9 04/14/2020 1053   CREATININE 0.73 04/14/2020 1053      Component Value Date/Time   CALCIUM 9.2 04/14/2020 1053   ALKPHOS 70 04/14/2020 1053   AST 30 04/14/2020 1053   ALT 32  04/14/2020 1053   BILITOT 0.4 04/14/2020 1053       RADIOGRAPHIC STUDIES: No results found.  ASSESSMENT AND PLAN: This is a very pleasant 75 years old white female recently diagnosed with a stage IV (T1b, N2, M1 a) non-small cell lung cancer, adenocarcinoma with no actionable mutations based on the molecular studies by [facility]. The patient underwent CT-guided core biopsy of one of the left lung pleural-based nodules by interventional radiology and the final pathology was consistent with metastatic adenocarcinoma. Her tissue block was [facility] for molecular studies and PD-L1 expression.  The molecular studies by [facility] showed positive K-ras G12C mutation which will be an option for targeted therapy in the second line setting with Lumak[facility] (Sotorasib). The patient is currently undergoing palliative systemic chemotherapy with carboplatin for AUC of 5, Alimta 500 mg/M2 and Keytruda 200 mg IV every 3 weeks.  Status post 2 cycles.  She is tolerating her treatment fairly well. I recommended for her to proceed with cycle #3 today. I will see her back for follow-up visit in 3 weeks for evaluation after repeating CT scan of the chest, abdomen pelvis for restaging of her disease. For the lack of appetite and depression, I started the patient on Remeron but she could not tolerate it. For pain management she will continue her current treatment with Percocet on as-needed basis. The patient was advised to call immediately if she has any other concerning symptoms in the interval. The patient voices understanding of current disease status and treatment options and is in agreement with the current care plan.  All questions were answered. The patient knows to call the clinic with any problems, questions or concerns. We can certainly see the patient much sooner if necessary.  Disclaimer: This note was dictated with voice recognition software. Similar sounding words can inadvertently be  transcribed and may not be corrected upon review.

## 2020-04-21 NOTE — Progress Notes (Signed)
Met with patient at registration to obtain income documentation for [employer].  Patient approved for one-time $1000 [employer] grant to assist with personal expenses while going through treatment. Discussed in detail expenses and how they are covered She has a copy of the approval letter and expense sheet along with the Outpatient pharmacy information. She received a gift card today from her grant.   She has my card for any additional financial questions or concerns.

## 2020-04-22 ENCOUNTER — Telehealth: Payer: Self-pay | Admitting: Medical Oncology

## 2020-04-22 ENCOUNTER — Telehealth: Payer: Self-pay | Admitting: Internal Medicine

## 2020-04-22 NOTE — Telephone Encounter (Signed)
Pt received a bill from [facility] for [phone] for genomic testing by [facility]. I contact [facility] to contact pt.  I faxed [employer] with  recent office note with genetic marker results .

## 2020-04-22 NOTE — Telephone Encounter (Signed)
Scheduled appointments per 2/22 los. Will have updated calendar printed for patient at next visit.

## 2020-04-22 NOTE — Telephone Encounter (Signed)
I spoke with pt and advised per her visit with Dr. Julien Nordmann yesterday 04/21/20, he ordered the CT scan to restage her disease. Pt expressed understanding of this information.

## 2020-04-28 ENCOUNTER — Inpatient Hospital Stay: Payer: Medicare PPO | Attending: Internal Medicine

## 2020-04-28 ENCOUNTER — Other Ambulatory Visit: Payer: Self-pay | Admitting: Internal Medicine

## 2020-04-28 ENCOUNTER — Other Ambulatory Visit: Payer: Self-pay

## 2020-04-28 ENCOUNTER — Other Ambulatory Visit: Payer: Medicare PPO

## 2020-04-28 DIAGNOSIS — C3491 Malignant neoplasm of unspecified part of right bronchus or lung: Secondary | ICD-10-CM | POA: Insufficient documentation

## 2020-04-28 DIAGNOSIS — Z5111 Encounter for antineoplastic chemotherapy: Secondary | ICD-10-CM | POA: Diagnosis not present

## 2020-04-28 DIAGNOSIS — Z79899 Other long term (current) drug therapy: Secondary | ICD-10-CM | POA: Insufficient documentation

## 2020-04-28 DIAGNOSIS — Z5112 Encounter for antineoplastic immunotherapy: Secondary | ICD-10-CM | POA: Diagnosis not present

## 2020-04-28 DIAGNOSIS — C3492 Malignant neoplasm of unspecified part of left bronchus or lung: Secondary | ICD-10-CM

## 2020-04-28 LAB — CBC WITH DIFFERENTIAL (CANCER CENTER ONLY)
Abs Immature Granulocytes: 0.01 10*3/uL (ref 0.00–0.07)
Basophils Absolute: 0 10*3/uL (ref 0.0–0.1)
Basophils Relative: 0 %
Eosinophils Absolute: 0 10*3/uL (ref 0.0–0.5)
Eosinophils Relative: 1 %
HCT: 33.3 % — ABNORMAL LOW (ref 36.0–46.0)
Hemoglobin: 11.4 g/dL — ABNORMAL LOW (ref 12.0–15.0)
Immature Granulocytes: 0 %
Lymphocytes Relative: 23 %
Lymphs Abs: 0.7 10*3/uL (ref 0.7–4.0)
MCH: 29.5 pg (ref 26.0–34.0)
MCHC: 34.2 g/dL (ref 30.0–36.0)
MCV: 86 fL (ref 80.0–100.0)
Monocytes Absolute: 0.3 10*3/uL (ref 0.1–1.0)
Monocytes Relative: 12 %
Neutro Abs: 1.8 10*3/uL (ref 1.7–7.7)
Neutrophils Relative %: 64 %
Platelet Count: 170 10*3/uL (ref 150–400)
RBC: 3.87 MIL/uL (ref 3.87–5.11)
RDW: 14 % (ref 11.5–15.5)
WBC Count: 2.9 10*3/uL — ABNORMAL LOW (ref 4.0–10.5)
nRBC: 0 % (ref 0.0–0.2)

## 2020-04-28 LAB — CMP (CANCER CENTER ONLY)
ALT: 22 U/L (ref 0–44)
AST: 22 U/L (ref 15–41)
Albumin: 3.9 g/dL (ref 3.5–5.0)
Alkaline Phosphatase: 72 U/L (ref 38–126)
Anion gap: 11 (ref 5–15)
BUN: 14 mg/dL (ref 8–23)
CO2: 29 mmol/L (ref 22–32)
Calcium: 9.5 mg/dL (ref 8.9–10.3)
Chloride: 96 mmol/L — ABNORMAL LOW (ref 98–111)
Creatinine: 0.72 mg/dL (ref 0.44–1.00)
GFR, Estimated: >60 mL/min (ref >60)
Glucose, Bld: 102 mg/dL — ABNORMAL HIGH (ref 70–99)
Potassium: 4 mmol/L (ref 3.5–5.1)
Sodium: 136 mmol/L (ref 135–145)
Total Bilirubin: 0.5 mg/dL (ref 0.3–1.2)
Total Protein: 7.4 g/dL (ref 6.5–8.1)

## 2020-04-29 ENCOUNTER — Encounter: Payer: Self-pay | Admitting: Internal Medicine

## 2020-04-29 ENCOUNTER — Other Ambulatory Visit: Payer: Self-pay | Admitting: Internal Medicine

## 2020-04-29 MED ORDER — OXYCODONE-ACETAMINOPHEN 5-325 MG PO TABS: 1.0000 | ORAL_TABLET | Freq: Four times a day (QID) | ORAL | 0 refills | Status: DC | PRN

## 2020-04-29 NOTE — Telephone Encounter (Signed)
For your approval or refusal. Gardiner Rhyme, RN

## 2020-04-29 NOTE — Progress Notes (Signed)
Returned call to patient whom left a voicemail regarding grant expenses.  Explained to patient expenses on the list that are covered are at the top and unacceptable expenses are at the bottom.  She verbalized understanding.  She has my card for any additional financial questions or concerns.

## 2020-04-30 MED FILL — OXYCODONE-APAP 5-325MG: 5-325 | 7 days supply | Qty: 30 | Fill #0

## 2020-05-05 ENCOUNTER — Other Ambulatory Visit: Payer: Self-pay

## 2020-05-05 ENCOUNTER — Inpatient Hospital Stay: Payer: Medicare PPO

## 2020-05-05 DIAGNOSIS — Z5112 Encounter for antineoplastic immunotherapy: Secondary | ICD-10-CM | POA: Diagnosis not present

## 2020-05-05 DIAGNOSIS — C3491 Malignant neoplasm of unspecified part of right bronchus or lung: Secondary | ICD-10-CM | POA: Diagnosis not present

## 2020-05-05 DIAGNOSIS — Z79899 Other long term (current) drug therapy: Secondary | ICD-10-CM | POA: Diagnosis not present

## 2020-05-05 DIAGNOSIS — C3492 Malignant neoplasm of unspecified part of left bronchus or lung: Secondary | ICD-10-CM

## 2020-05-05 DIAGNOSIS — Z5111 Encounter for antineoplastic chemotherapy: Secondary | ICD-10-CM | POA: Diagnosis not present

## 2020-05-05 LAB — CMP (CANCER CENTER ONLY)
ALT: 26 U/L (ref 0–44)
AST: 24 U/L (ref 15–41)
Albumin: 3.7 g/dL (ref 3.5–5.0)
Alkaline Phosphatase: 73 U/L (ref 38–126)
Anion gap: 9 (ref 5–15)
BUN: 9 mg/dL (ref 8–23)
CO2: 29 mmol/L (ref 22–32)
Calcium: 9.1 mg/dL (ref 8.9–10.3)
Chloride: 100 mmol/L (ref 98–111)
Creatinine: 0.63 mg/dL (ref 0.44–1.00)
GFR, Estimated: >60 mL/min (ref >60)
Glucose, Bld: 99 mg/dL (ref 70–99)
Potassium: 3.6 mmol/L (ref 3.5–5.1)
Sodium: 138 mmol/L (ref 135–145)
Total Bilirubin: 0.3 mg/dL (ref 0.3–1.2)
Total Protein: 7.1 g/dL (ref 6.5–8.1)

## 2020-05-05 LAB — CBC WITH DIFFERENTIAL (CANCER CENTER ONLY)
Abs Immature Granulocytes: 0.01 10*3/uL (ref 0.00–0.07)
Basophils Absolute: 0 10*3/uL (ref 0.0–0.1)
Basophils Relative: 0 %
Eosinophils Absolute: 0 10*3/uL (ref 0.0–0.5)
Eosinophils Relative: 1 %
HCT: 29.9 % — ABNORMAL LOW (ref 36.0–46.0)
Hemoglobin: 10.2 g/dL — ABNORMAL LOW (ref 12.0–15.0)
Immature Granulocytes: 0 %
Lymphocytes Relative: 24 %
Lymphs Abs: 0.8 10*3/uL (ref 0.7–4.0)
MCH: 30.1 pg (ref 26.0–34.0)
MCHC: 34.1 g/dL (ref 30.0–36.0)
MCV: 88.2 fL (ref 80.0–100.0)
Monocytes Absolute: 0.5 10*3/uL (ref 0.1–1.0)
Monocytes Relative: 15 %
Neutro Abs: 2 10*3/uL (ref 1.7–7.7)
Neutrophils Relative %: 60 %
Platelet Count: 132 10*3/uL — ABNORMAL LOW (ref 150–400)
RBC: 3.39 MIL/uL — ABNORMAL LOW (ref 3.87–5.11)
RDW: 14.5 % (ref 11.5–15.5)
WBC Count: 3.3 10*3/uL — ABNORMAL LOW (ref 4.0–10.5)
nRBC: 0 % (ref 0.0–0.2)

## 2020-05-11 ENCOUNTER — Other Ambulatory Visit: Payer: Self-pay

## 2020-05-11 ENCOUNTER — Ambulatory Visit (HOSPITAL_COMMUNITY)
Admission: RE | Admit: 2020-05-11 | Discharge: 2020-05-11 | Disposition: A | Discharge status: Home / Self Care | Payer: Medicare PPO | Source: Ambulatory Visit | Attending: Internal Medicine | Admitting: Internal Medicine

## 2020-05-11 DIAGNOSIS — C3490 Malignant neoplasm of unspecified part of unspecified bronchus or lung: Secondary | ICD-10-CM | POA: Diagnosis not present

## 2020-05-11 DIAGNOSIS — J90 Pleural effusion, not elsewhere classified: Secondary | ICD-10-CM | POA: Diagnosis not present

## 2020-05-11 DIAGNOSIS — N8189 Other female genital prolapse: Secondary | ICD-10-CM | POA: Diagnosis not present

## 2020-05-11 DIAGNOSIS — C7951 Secondary malignant neoplasm of bone: Secondary | ICD-10-CM | POA: Diagnosis not present

## 2020-05-11 DIAGNOSIS — I2510 Atherosclerotic heart disease of native coronary artery without angina pectoris: Secondary | ICD-10-CM | POA: Diagnosis not present

## 2020-05-11 DIAGNOSIS — I700 Atherosclerosis of aorta: Secondary | ICD-10-CM | POA: Diagnosis not present

## 2020-05-11 MED ORDER — IOHEXOL 300 MG/ML  SOLN
100.0000 mL | Freq: Once | INTRAMUSCULAR | Status: AC | PRN
  Administered 2020-05-11: 100 mL via INTRAVENOUS

## 2020-05-12 ENCOUNTER — Other Ambulatory Visit: Payer: Self-pay

## 2020-05-12 ENCOUNTER — Encounter: Payer: Self-pay | Admitting: Internal Medicine

## 2020-05-12 ENCOUNTER — Inpatient Hospital Stay: Payer: Medicare PPO

## 2020-05-12 ENCOUNTER — Inpatient Hospital Stay (HOSPITAL_BASED_OUTPATIENT_CLINIC_OR_DEPARTMENT_OTHER): Payer: Medicare PPO | Admitting: Internal Medicine

## 2020-05-12 VITALS — BP 143/63 | HR 81 | Temp 98.3°F | Resp 18 | Ht 64.0 in | Wt 184.0 lb

## 2020-05-12 DIAGNOSIS — C3492 Malignant neoplasm of unspecified part of left bronchus or lung: Secondary | ICD-10-CM | POA: Diagnosis not present

## 2020-05-12 DIAGNOSIS — I10 Essential (primary) hypertension: Secondary | ICD-10-CM | POA: Diagnosis not present

## 2020-05-12 DIAGNOSIS — C3491 Malignant neoplasm of unspecified part of right bronchus or lung: Secondary | ICD-10-CM | POA: Diagnosis not present

## 2020-05-12 DIAGNOSIS — Z5111 Encounter for antineoplastic chemotherapy: Secondary | ICD-10-CM

## 2020-05-12 DIAGNOSIS — Z5112 Encounter for antineoplastic immunotherapy: Secondary | ICD-10-CM | POA: Diagnosis not present

## 2020-05-12 DIAGNOSIS — Z79899 Other long term (current) drug therapy: Secondary | ICD-10-CM | POA: Diagnosis not present

## 2020-05-12 LAB — CBC WITH DIFFERENTIAL (CANCER CENTER ONLY)
Abs Immature Granulocytes: 0.01 10*3/uL (ref 0.00–0.07)
Basophils Absolute: 0 10*3/uL (ref 0.0–0.1)
Basophils Relative: 0 %
Eosinophils Absolute: 0 10*3/uL (ref 0.0–0.5)
Eosinophils Relative: 1 %
HCT: 31.6 % — ABNORMAL LOW (ref 36.0–46.0)
Hemoglobin: 10.9 g/dL — ABNORMAL LOW (ref 12.0–15.0)
Immature Granulocytes: 0 %
Lymphocytes Relative: 20 %
Lymphs Abs: 0.8 10*3/uL (ref 0.7–4.0)
MCH: 30.6 pg (ref 26.0–34.0)
MCHC: 34.5 g/dL (ref 30.0–36.0)
MCV: 88.8 fL (ref 80.0–100.0)
Monocytes Absolute: 0.5 10*3/uL (ref 0.1–1.0)
Monocytes Relative: 12 %
Neutro Abs: 2.5 10*3/uL (ref 1.7–7.7)
Neutrophils Relative %: 67 %
Platelet Count: 338 10*3/uL (ref 150–400)
RBC: 3.56 MIL/uL — ABNORMAL LOW (ref 3.87–5.11)
RDW: 16.6 % — ABNORMAL HIGH (ref 11.5–15.5)
WBC Count: 3.8 10*3/uL — ABNORMAL LOW (ref 4.0–10.5)
nRBC: 0 % (ref 0.0–0.2)

## 2020-05-12 LAB — CMP (CANCER CENTER ONLY)
ALT: 25 U/L (ref 0–44)
AST: 25 U/L (ref 15–41)
Albumin: 3.9 g/dL (ref 3.5–5.0)
Alkaline Phosphatase: 75 U/L (ref 38–126)
Anion gap: 9 (ref 5–15)
BUN: 8 mg/dL (ref 8–23)
CO2: 26 mmol/L (ref 22–32)
Calcium: 9.4 mg/dL (ref 8.9–10.3)
Chloride: 103 mmol/L (ref 98–111)
Creatinine: 0.67 mg/dL (ref 0.44–1.00)
GFR, Estimated: >60 mL/min (ref >60)
Glucose, Bld: 103 mg/dL — ABNORMAL HIGH (ref 70–99)
Potassium: 3.7 mmol/L (ref 3.5–5.1)
Sodium: 138 mmol/L (ref 135–145)
Total Bilirubin: 0.3 mg/dL (ref 0.3–1.2)
Total Protein: 7.6 g/dL (ref 6.5–8.1)

## 2020-05-12 LAB — TSH: TSH: 2.014 u[IU]/mL (ref 0.308–3.960)

## 2020-05-12 MED ORDER — SODIUM CHLORIDE 0.9% FLUSH
10.0000 mL | INTRAVENOUS | Status: DC | PRN
  Filled 2020-05-12: qty 10

## 2020-05-12 MED ORDER — SODIUM CHLORIDE 0.9 % IV SOLN
477.5000 mg | Freq: Once | INTRAVENOUS | Status: AC
  Administered 2020-05-12: 480 mg via INTRAVENOUS
  Filled 2020-05-12 (×2): qty 48

## 2020-05-12 MED ORDER — SODIUM CHLORIDE 0.9 % IV SOLN
10.0000 mg | Freq: Once | INTRAVENOUS | Status: AC
  Administered 2020-05-12: 10 mg via INTRAVENOUS
  Filled 2020-05-12: qty 10

## 2020-05-12 MED ORDER — SODIUM CHLORIDE 0.9 % IV SOLN
200.0000 mg | Freq: Once | INTRAVENOUS | Status: AC
  Administered 2020-05-12: 200 mg via INTRAVENOUS
  Filled 2020-05-12: qty 8

## 2020-05-12 MED ORDER — SODIUM CHLORIDE 0.9 % IV SOLN
150.0000 mg | Freq: Once | INTRAVENOUS | Status: AC
  Administered 2020-05-12: 150 mg via INTRAVENOUS
  Filled 2020-05-12: qty 150

## 2020-05-12 MED ORDER — SODIUM CHLORIDE 0.9 % IV SOLN
Freq: Once | INTRAVENOUS | Status: AC
  Filled 2020-05-12: qty 250

## 2020-05-12 MED ORDER — PALONOSETRON HCL INJECTION 0.25 MG/5ML
0.2500 mg | Freq: Once | INTRAVENOUS | Status: AC
  Administered 2020-05-12: 0.25 mg via INTRAVENOUS

## 2020-05-12 MED ORDER — HEPARIN SOD (PORK) LOCK FLUSH 100 UNIT/ML IV SOLN
500.0000 [IU] | Freq: Once | INTRAVENOUS | Status: DC | PRN
  Filled 2020-05-12: qty 5

## 2020-05-12 MED ORDER — PALONOSETRON HCL INJECTION 0.25 MG/5ML
INTRAVENOUS | Status: AC
  Filled 2020-05-12: qty 5

## 2020-05-12 MED ORDER — SODIUM CHLORIDE 0.9 % IV SOLN
500.0000 mg/m2 | Freq: Once | INTRAVENOUS | Status: AC
  Administered 2020-05-12: 1000 mg via INTRAVENOUS
  Filled 2020-05-12: qty 40

## 2020-05-12 NOTE — Patient Instructions (Signed)
[facility] Discharge Instructions for Patients Receiving Chemotherapy  Today you received the following chemotherapy agents: Keytruda, Alimta, Carboplatin  To help prevent nausea and vomiting after your treatment, we encourage you to take your nausea medication as directed.    If you develop nausea and vomiting that is not controlled by your nausea medication, call the clinic.   BELOW ARE SYMPTOMS THAT SHOULD BE REPORTED IMMEDIATELY:  *FEVER GREATER THAN 100.5 F  *CHILLS WITH OR WITHOUT FEVER  NAUSEA AND VOMITING THAT IS NOT CONTROLLED WITH YOUR NAUSEA MEDICATION  *UNUSUAL SHORTNESS OF BREATH  *UNUSUAL BRUISING OR BLEEDING  TENDERNESS IN MOUTH AND THROAT WITH OR WITHOUT PRESENCE OF ULCERS  *URINARY PROBLEMS  *BOWEL PROBLEMS  UNUSUAL RASH Items with * indicate a potential emergency and should be followed up as soon as possible.  Feel free to call the clinic should you have any questions or concerns. The clinic phone number is [phone].  Please show the [place] at check-in to the Emergency Department and triage nurse.

## 2020-05-12 NOTE — Progress Notes (Signed)
[place] [phone]   [phone]  OFFICE PROGRESS NOTE  [name], MD [place] [address] [place] [address]  DIAGNOSIS: stage IV (T1b, N1, M1 a) non-small cell lung cancer favoring adenocarcinoma diagnosed in October 2021.  The pathologic immunohistochemical stains suggestive of upper GI primary but there is no finding on the PET scan or imaging study to support this possibility.  Biomarker Findings Microsatellite status - MS-Stable Tumor Mutational Burden - 3 Muts/Mb Genomic Findings For a complete list of the genes assayed, please refer to the Appendix. KRAS G12C STK11 E145* 7 Disease relevant genes with no reportable alterations: ALK, BRAF, EGFR, ERBB2, MET, RET, ROS1  PDL1 Expression 0%  PRIOR THERAPY: None  CURRENT THERAPY: Systemic chemotherapy with carboplatin for AUC of 5, Alimta 500 mg/M2 and Keytruda 200 mg IV every 3 weeks.  First dose March 11, 2019.  Status post 3 cycles.  INTERVAL HISTORY: Cindy [name] 75 y.o. female returns to the clinic today for follow-up visit.  The patient is feeling fine today with no concerning complaints except for fatigue and an anxiety about her scan results.  She denied having any current chest pain but has shortness of breath with exertion with mild cough and no hemoptysis.  She denied having any fever or chills.  She has no nausea, vomiting, diarrhea or constipation.  She has no headache or visual changes.  She has no recent weight loss or night sweats.  The patient had repeat CT scan of the chest, abdomen pelvis performed recently and she is here for evaluation and discussion of her risk her results.   MEDICAL HISTORY: Past Medical History:  Diagnosis Date  . Anxiety   . Depression   . Hyperlipidemia   . Hypertension     ALLERGIES:  is allergic to escitalopram, povidone iodine, and procaine.  MEDICATIONS:  Current Outpatient Medications  Medication Sig  Dispense Refill  . ALPRAZolam (XANAX) 0.25 MG tablet Take 0.25 mg by mouth daily as needed for anxiety.    . Ascorbic Acid (VITAMIN C PO) Take 1 tablet by mouth daily.    Marland Kitchen aspirin 81 MG tablet Take 81 mg by mouth daily.    Marland Kitchen b complex vitamins capsule Take 1 capsule by mouth 2 (two) times a week.    . calcium carbonate (TUMS - DOSED IN MG ELEMENTAL CALCIUM) 500 MG chewable tablet Chew 500-1,000 mg by mouth daily as needed for indigestion or heartburn.    . folic acid (FOLVITE) 1 MG tablet Take 1 tablet (1 mg total) by mouth daily. 30 tablet 4  . hydrochlorothiazide (HYDRODIURIL) 25 MG tablet Take 25 mg by mouth daily.    . metoprolol succinate (TOPROL-XL) 50 MG 24 hr tablet Take 50 mg by mouth daily. Take with or immediately following a meal.    . Multiple Minerals-Vitamins (CALCIUM-MAGNESIUM-ZINC-D3 PO) Take 1 tablet by mouth 2 (two) times a week.    . Multiple Vitamin (MULTIVITAMIN) tablet Take 1 tablet by mouth 3 (three) times a week.    Marland Kitchen oxyCODONE-acetaminophen (PERCOCET/ROXICET) 5-325 MG tablet Take 1 tablet by mouth every 6 (six) hours as needed for severe pain. 30 tablet 0  . Polyvinyl Alcohol-Povidone (REFRESH OP) Place 1 drop into both eyes daily as needed (dry eyes).    . prochlorperazine (COMPAZINE) 10 MG tablet TAKE 1 TABLET(10 MG) BY MOUTH EVERY 6 HOURS AS NEEDED FOR NAUSEA OR VOMITING 30 tablet 0  . simvastatin (ZOCOR) 20 MG tablet  Take 20 mg by mouth daily.    Marland Kitchen VITAMIN D PO Take 1 capsule by mouth 3 (three) times a week.    . Vitamin D, Ergocalciferol, (DRISDOL) 1.25 MG (50000 UNIT) CAPS capsule 1 capsule    . VITAMIN E PO Take 1 capsule by mouth 2 (two) times a week.     No current facility-administered medications for this visit.    SURGICAL HISTORY:  Past Surgical History:  Procedure Laterality Date  . DENTAL SURGERY  2012 - 2013   implants  . IR THORACENTESIS ASP PLEURAL SPACE W/IMG GUIDE  12/19/2019  . WISDOM TOOTH EXTRACTION  20    REVIEW OF SYSTEMS:   Constitutional: positive for fatigue Eyes: negative Ears, nose, mouth, throat, and face: negative Respiratory: positive for cough and dyspnea on exertion Cardiovascular: negative Gastrointestinal: negative Genitourinary:negative Integument/breast: negative Hematologic/lymphatic: negative Musculoskeletal:negative Neurological: negative Behavioral/Psych: negative Endocrine: negative Allergic/Immunologic: negative   PHYSICAL EXAMINATION: General appearance: alert, cooperative, fatigued and no distress Head: Normocephalic, without obvious abnormality, atraumatic Neck: no adenopathy, no JVD, supple, symmetrical, trachea midline and thyroid not enlarged, symmetric, no tenderness/mass/nodules Lymph nodes: Cervical, supraclavicular, and axillary nodes normal. Resp: clear to auscultation bilaterally Back: symmetric, no curvature. ROM normal. No CVA tenderness. Cardio: regular rate and rhythm, S1, S2 normal, no murmur, click, rub or gallop GI: soft, non-tender; bowel sounds normal; no masses,  no organomegaly Extremities: extremities normal, atraumatic, no cyanosis or edema Neurologic: Alert and oriented X 3, normal strength and tone. Normal symmetric reflexes. Normal coordination and gait  ECOG PERFORMANCE STATUS: 1 - Symptomatic but completely ambulatory  Blood pressure (!) 143/63, pulse 81, temperature 98.3 F (36.8 C), temperature source Tympanic, resp. rate 18, height $RemoveBe'5\' 4"'[id number]$  (1.626 m), weight 184 lb (83.5 kg), last menstrual period 09/29/2002, SpO2 97 %.  LABORATORY DATA: Lab Results  Component Value Date   WBC 3.8 (L) 05/12/2020   HGB 10.9 (L) 05/12/2020   HCT 31.6 (L) 05/12/2020   MCV 88.8 05/12/2020   PLT 338 05/12/2020      Chemistry      Component Value Date/Time   NA 138 05/05/2020 1046   K 3.6 05/05/2020 1046   CL 100 05/05/2020 1046   CO2 29 05/05/2020 1046   BUN 9 05/05/2020 1046   CREATININE 0.63 05/05/2020 1046      Component Value Date/Time   CALCIUM 9.1  05/05/2020 1046   ALKPHOS 73 05/05/2020 1046   AST 24 05/05/2020 1046   ALT 26 05/05/2020 1046   BILITOT 0.3 05/05/2020 1046       RADIOGRAPHIC STUDIES: CT Chest W Contrast  Result Date: 05/11/2020 CLINICAL DATA:  Stage IV non-small cell lung cancer favoring adenocarcinoma diagnosed in October. Status post chemotherapy. EXAM: CT CHEST, ABDOMEN, AND PELVIS WITH CONTRAST TECHNIQUE: Multidetector CT imaging of the chest, abdomen and pelvis was performed following the standard protocol during bolus administration of intravenous contrast. CONTRAST:  126mL OMNIPAQUE IOHEXOL 300 MG/ML  SOLN COMPARISON:  PET of 01/07/2020.  Clinic note of 04/21/2020 FINDINGS: Motion degradation involving the lower chest and abdomen is mild to moderate. CT CHEST FINDINGS Cardiovascular: Aortic atherosclerosis. Mild cardiomegaly, without pericardial effusion. Lad coronary artery calcification. No central pulmonary embolism, on this non-dedicated study. Mediastinum/Nodes: No supraclavicular adenopathy. No mediastinal adenopathy. Left hilar/infrahilar node of 1.1 cm on 29/2 is grossly similar to on the prior, but suboptimally evaluated on the noncontrast CT. A right paratracheal node measures 8 mm on 14/2 versus 4 mm on the prior. 6 mm prevascular node on  25/2 is new. Lungs/Pleura: Similar to minimal decrease in small left pleural effusion. Mild centrilobular emphysema. Anterior left upper lobe pleural-based nodule of 1.4 cm on 47/7 measures 1.5 cm on 01/02/2020 (when remeasured). Superior segment left lower lobe pulmonary nodule measures 1.4 cm on 61/7 versus similar on the prior exam (when remeasured). 3 mm subpleural right lower lobe pulmonary nodule on 76/7 is similar. Underlying pattern of irregular left lower lobe septal thickening is relatively similar and suspicious for lymphangitic tumor spread. Musculoskeletal: New sclerotic foci including within the posterior portion of T2 on 10/02 and the left medial clavicle on 11/02.  CT ABDOMEN PELVIS FINDINGS Hepatobiliary: Motion degradation. Normal liver. Normal gallbladder, without biliary ductal dilatation. Pancreas: Grossly normal pancreas, without duct dilatation or acute inflammation. Spleen: Normal in size, without focal abnormality. Adrenals/Urinary Tract: Normal adrenal glands. Normal kidneys, without hydronephrosis. Normal urinary bladder. Stomach/Bowel: Normal stomach, without wall thickening. Scattered colonic diverticula. Normal terminal ileum and appendix. Normal small bowel. Vascular/Lymphatic: Aortic atherosclerosis. No abdominopelvic adenopathy. Reproductive: Normal uterus and adnexa. Other: No significant free fluid. Mild pelvic floor laxity. No evidence of omental or peritoneal disease. Musculoskeletal: New right iliac sclerotic lesion of 1.1 cm on 94/2. Superior posterior L3 sclerotic lesion measures 1.4 cm on sagittal image 104. IMPRESSION: 1. Motion degraded evaluation of the lower chest and upper abdomen. 2. Relatively similar appearance of pleural and parenchymal left-sided metastatic disease as detailed above. 3. Left infrahilar adenopathy is grossly similar. Small but enlarging right paratracheal and prevascular mediastinal nodes for which progressive nodal metastasis is a concern. 4. New sclerotic osseous metastasis. This either represents new metastatic disease or interval healing of previously occult metastatic disease. 5. No soft tissue metastasis within the abdomen or pelvis. 6. Aortic atherosclerosis (ICD10-I70.0), coronary artery atherosclerosis and emphysema (ICD10-J43.9). Electronically Signed   By: Abigail Miyamoto M.D.   On: 05/11/2020 16:39   CT Abdomen Pelvis W Contrast  Result Date: 05/11/2020 CLINICAL DATA:  Stage IV non-small cell lung cancer favoring adenocarcinoma diagnosed in October. Status post chemotherapy. EXAM: CT CHEST, ABDOMEN, AND PELVIS WITH CONTRAST TECHNIQUE: Multidetector CT imaging of the chest, abdomen and pelvis was performed  following the standard protocol during bolus administration of intravenous contrast. CONTRAST:  160mL OMNIPAQUE IOHEXOL 300 MG/ML  SOLN COMPARISON:  PET of 01/07/2020.  Clinic note of 04/21/2020 FINDINGS: Motion degradation involving the lower chest and abdomen is mild to moderate. CT CHEST FINDINGS Cardiovascular: Aortic atherosclerosis. Mild cardiomegaly, without pericardial effusion. Lad coronary artery calcification. No central pulmonary embolism, on this non-dedicated study. Mediastinum/Nodes: No supraclavicular adenopathy. No mediastinal adenopathy. Left hilar/infrahilar node of 1.1 cm on 29/2 is grossly similar to on the prior, but suboptimally evaluated on the noncontrast CT. A right paratracheal node measures 8 mm on 14/2 versus 4 mm on the prior. 6 mm prevascular node on 25/2 is new. Lungs/Pleura: Similar to minimal decrease in small left pleural effusion. Mild centrilobular emphysema. Anterior left upper lobe pleural-based nodule of 1.4 cm on 47/7 measures 1.5 cm on 01/02/2020 (when remeasured). Superior segment left lower lobe pulmonary nodule measures 1.4 cm on 61/7 versus similar on the prior exam (when remeasured). 3 mm subpleural right lower lobe pulmonary nodule on 76/7 is similar. Underlying pattern of irregular left lower lobe septal thickening is relatively similar and suspicious for lymphangitic tumor spread. Musculoskeletal: New sclerotic foci including within the posterior portion of T2 on 10/02 and the left medial clavicle on 11/02. CT ABDOMEN PELVIS FINDINGS Hepatobiliary: Motion degradation. Normal liver. Normal gallbladder, without biliary ductal  dilatation. Pancreas: Grossly normal pancreas, without duct dilatation or acute inflammation. Spleen: Normal in size, without focal abnormality. Adrenals/Urinary Tract: Normal adrenal glands. Normal kidneys, without hydronephrosis. Normal urinary bladder. Stomach/Bowel: Normal stomach, without wall thickening. Scattered colonic diverticula. Normal  terminal ileum and appendix. Normal small bowel. Vascular/Lymphatic: Aortic atherosclerosis. No abdominopelvic adenopathy. Reproductive: Normal uterus and adnexa. Other: No significant free fluid. Mild pelvic floor laxity. No evidence of omental or peritoneal disease. Musculoskeletal: New right iliac sclerotic lesion of 1.1 cm on 94/2. Superior posterior L3 sclerotic lesion measures 1.4 cm on sagittal image 104. IMPRESSION: 1. Motion degraded evaluation of the lower chest and upper abdomen. 2. Relatively similar appearance of pleural and parenchymal left-sided metastatic disease as detailed above. 3. Left infrahilar adenopathy is grossly similar. Small but enlarging right paratracheal and prevascular mediastinal nodes for which progressive nodal metastasis is a concern. 4. New sclerotic osseous metastasis. This either represents new metastatic disease or interval healing of previously occult metastatic disease. 5. No soft tissue metastasis within the abdomen or pelvis. 6. Aortic atherosclerosis (ICD10-I70.0), coronary artery atherosclerosis and emphysema (ICD10-J43.9). Electronically Signed   By: Abigail Miyamoto M.D.   On: 05/11/2020 16:39    ASSESSMENT AND PLAN: This is a very pleasant 75 years old white female recently diagnosed with a stage IV (T1b, N2, M1 a) non-small cell lung cancer, adenocarcinoma with no actionable mutations based on the molecular studies by [facility]. The patient underwent CT-guided core biopsy of one of the left lung pleural-based nodules by interventional radiology and the final pathology was consistent with metastatic adenocarcinoma. Her tissue block [facility] for molecular studies and PD-L1 expression.  The molecular studies by [facility] showed positive K-ras G12C mutation which will be an option for targeted therapy in the second line setting with Lumak[facility] (Sotorasib). The patient is currently undergoing palliative systemic chemotherapy with carboplatin for AUC  of 5, Alimta 500 mg/M2 and Keytruda 200 mg IV every 3 weeks.  Status post 3 cycles.  The patient has been tolerating this treatment well with no concerning adverse effect except for mild fatigue. She had repeat CT scan of the chest, abdomen pelvis performed recently.  I personally and independently reviewed the scan images and discussed the result and showed the images to the patient today. Her scan showed no concerning findings for disease progression and the small mediastinal lymph nodes could be a result of the immunotherapy need close monitoring on the upcoming scan. I recommended for the patient to proceed with cycle #4 today as planned. I will see her back for follow-up visit in 3 weeks for evaluation before the next cycle of her treatment. For the hypertension the patient will continue with her current blood pressure medication and monitor it closely at home. For pain management, she is on Percocet on as-needed basis. The patient was advised to call immediately if she has any concerning symptoms in the interval. The patient voices understanding of current disease status and treatment options and is in agreement with the current care plan.  All questions were answered. The patient knows to call the clinic with any problems, questions or concerns. We can certainly see the patient much sooner if necessary.  Disclaimer: This note was dictated with voice recognition software. Similar sounding words can inadvertently be transcribed and may not be corrected upon review.

## 2020-05-19 ENCOUNTER — Inpatient Hospital Stay: Payer: Medicare PPO

## 2020-05-19 ENCOUNTER — Other Ambulatory Visit: Payer: Self-pay

## 2020-05-19 DIAGNOSIS — C3492 Malignant neoplasm of unspecified part of left bronchus or lung: Secondary | ICD-10-CM

## 2020-05-19 DIAGNOSIS — Z5111 Encounter for antineoplastic chemotherapy: Secondary | ICD-10-CM | POA: Diagnosis not present

## 2020-05-19 DIAGNOSIS — Z5112 Encounter for antineoplastic immunotherapy: Secondary | ICD-10-CM | POA: Diagnosis not present

## 2020-05-19 DIAGNOSIS — Z79899 Other long term (current) drug therapy: Secondary | ICD-10-CM | POA: Diagnosis not present

## 2020-05-19 DIAGNOSIS — C3491 Malignant neoplasm of unspecified part of right bronchus or lung: Secondary | ICD-10-CM | POA: Diagnosis not present

## 2020-05-19 LAB — CBC WITH DIFFERENTIAL (CANCER CENTER ONLY)
Abs Immature Granulocytes: 0 10*3/uL (ref 0.00–0.07)
Basophils Absolute: 0 10*3/uL (ref 0.0–0.1)
Basophils Relative: 0 %
Eosinophils Absolute: 0 10*3/uL (ref 0.0–0.5)
Eosinophils Relative: 0 %
HCT: 28.6 % — ABNORMAL LOW (ref 36.0–46.0)
Hemoglobin: 10 g/dL — ABNORMAL LOW (ref 12.0–15.0)
Immature Granulocytes: 0 %
Lymphocytes Relative: 29 %
Lymphs Abs: 0.7 10*3/uL (ref 0.7–4.0)
MCH: 30.8 pg (ref 26.0–34.0)
MCHC: 35 g/dL (ref 30.0–36.0)
MCV: 88 fL (ref 80.0–100.0)
Monocytes Absolute: 0.2 10*3/uL (ref 0.1–1.0)
Monocytes Relative: 10 %
Neutro Abs: 1.4 10*3/uL — ABNORMAL LOW (ref 1.7–7.7)
Neutrophils Relative %: 61 %
Platelet Count: 172 10*3/uL (ref 150–400)
RBC: 3.25 MIL/uL — ABNORMAL LOW (ref 3.87–5.11)
RDW: 15.3 % (ref 11.5–15.5)
WBC Count: 2.3 10*3/uL — ABNORMAL LOW (ref 4.0–10.5)
nRBC: 0 % (ref 0.0–0.2)

## 2020-05-19 LAB — CMP (CANCER CENTER ONLY)
ALT: 24 U/L (ref 0–44)
AST: 26 U/L (ref 15–41)
Albumin: 3.8 g/dL (ref 3.5–5.0)
Alkaline Phosphatase: 71 U/L (ref 38–126)
Anion gap: 12 (ref 5–15)
BUN: 12 mg/dL (ref 8–23)
CO2: 26 mmol/L (ref 22–32)
Calcium: 9 mg/dL (ref 8.9–10.3)
Chloride: 98 mmol/L (ref 98–111)
Creatinine: 0.66 mg/dL (ref 0.44–1.00)
GFR, Estimated: >60 mL/min (ref >60)
Glucose, Bld: 100 mg/dL — ABNORMAL HIGH (ref 70–99)
Potassium: 3.3 mmol/L — ABNORMAL LOW (ref 3.5–5.1)
Sodium: 136 mmol/L (ref 135–145)
Total Bilirubin: 0.5 mg/dL (ref 0.3–1.2)
Total Protein: 7.2 g/dL (ref 6.5–8.1)

## 2020-05-26 ENCOUNTER — Other Ambulatory Visit: Payer: Self-pay

## 2020-05-26 ENCOUNTER — Inpatient Hospital Stay: Payer: Medicare PPO

## 2020-05-26 DIAGNOSIS — Z5112 Encounter for antineoplastic immunotherapy: Secondary | ICD-10-CM | POA: Diagnosis not present

## 2020-05-26 DIAGNOSIS — Z79899 Other long term (current) drug therapy: Secondary | ICD-10-CM | POA: Diagnosis not present

## 2020-05-26 DIAGNOSIS — C3491 Malignant neoplasm of unspecified part of right bronchus or lung: Secondary | ICD-10-CM | POA: Diagnosis not present

## 2020-05-26 DIAGNOSIS — C3490 Malignant neoplasm of unspecified part of unspecified bronchus or lung: Secondary | ICD-10-CM

## 2020-05-26 DIAGNOSIS — Z5111 Encounter for antineoplastic chemotherapy: Secondary | ICD-10-CM | POA: Diagnosis not present

## 2020-05-26 LAB — CBC WITH DIFFERENTIAL (CANCER CENTER ONLY)
Abs Immature Granulocytes: 0.01 10*3/uL (ref 0.00–0.07)
Basophils Absolute: 0 10*3/uL (ref 0.0–0.1)
Basophils Relative: 0 %
Eosinophils Absolute: 0 10*3/uL (ref 0.0–0.5)
Eosinophils Relative: 1 %
HCT: 28.4 % — ABNORMAL LOW (ref 36.0–46.0)
Hemoglobin: 9.9 g/dL — ABNORMAL LOW (ref 12.0–15.0)
Immature Granulocytes: 0 %
Lymphocytes Relative: 24 %
Lymphs Abs: 0.7 10*3/uL (ref 0.7–4.0)
MCH: 31.5 pg (ref 26.0–34.0)
MCHC: 34.9 g/dL (ref 30.0–36.0)
MCV: 90.4 fL (ref 80.0–100.0)
Monocytes Absolute: 0.7 10*3/uL (ref 0.1–1.0)
Monocytes Relative: 21 %
Neutro Abs: 1.7 10*3/uL (ref 1.7–7.7)
Neutrophils Relative %: 54 %
Platelet Count: 128 10*3/uL — ABNORMAL LOW (ref 150–400)
RBC: 3.14 MIL/uL — ABNORMAL LOW (ref 3.87–5.11)
RDW: 16 % — ABNORMAL HIGH (ref 11.5–15.5)
WBC Count: 3.1 10*3/uL — ABNORMAL LOW (ref 4.0–10.5)
nRBC: 0 % (ref 0.0–0.2)

## 2020-05-26 LAB — CMP (CANCER CENTER ONLY)
ALT: 23 U/L (ref 0–44)
AST: 24 U/L (ref 15–41)
Albumin: 3.8 g/dL (ref 3.5–5.0)
Alkaline Phosphatase: 84 U/L (ref 38–126)
Anion gap: 12 (ref 5–15)
BUN: 12 mg/dL (ref 8–23)
CO2: 28 mmol/L (ref 22–32)
Calcium: 9.1 mg/dL (ref 8.9–10.3)
Chloride: 99 mmol/L (ref 98–111)
Creatinine: 0.7 mg/dL (ref 0.44–1.00)
GFR, Estimated: >60 mL/min (ref >60)
Glucose, Bld: 101 mg/dL — ABNORMAL HIGH (ref 70–99)
Potassium: 3.6 mmol/L (ref 3.5–5.1)
Sodium: 139 mmol/L (ref 135–145)
Total Bilirubin: 0.5 mg/dL (ref 0.3–1.2)
Total Protein: 7.2 g/dL (ref 6.5–8.1)

## 2020-06-02 ENCOUNTER — Encounter: Payer: Self-pay | Admitting: Internal Medicine

## 2020-06-02 ENCOUNTER — Other Ambulatory Visit: Payer: Self-pay

## 2020-06-02 ENCOUNTER — Inpatient Hospital Stay: Payer: Medicare PPO | Admitting: Internal Medicine

## 2020-06-02 ENCOUNTER — Inpatient Hospital Stay: Payer: Medicare PPO | Attending: Internal Medicine

## 2020-06-02 ENCOUNTER — Inpatient Hospital Stay: Payer: Medicare PPO

## 2020-06-02 VITALS — BP 156/72 | HR 79 | Temp 98.3°F | Resp 16 | Ht 64.0 in | Wt 181.0 lb

## 2020-06-02 DIAGNOSIS — Z5112 Encounter for antineoplastic immunotherapy: Secondary | ICD-10-CM

## 2020-06-02 DIAGNOSIS — C3492 Malignant neoplasm of unspecified part of left bronchus or lung: Secondary | ICD-10-CM

## 2020-06-02 DIAGNOSIS — Z5111 Encounter for antineoplastic chemotherapy: Secondary | ICD-10-CM

## 2020-06-02 DIAGNOSIS — C3491 Malignant neoplasm of unspecified part of right bronchus or lung: Secondary | ICD-10-CM | POA: Diagnosis not present

## 2020-06-02 DIAGNOSIS — Z79899 Other long term (current) drug therapy: Secondary | ICD-10-CM | POA: Insufficient documentation

## 2020-06-02 LAB — CMP (CANCER CENTER ONLY)
ALT: 28 U/L (ref 0–44)
AST: 27 U/L (ref 15–41)
Albumin: 4 g/dL (ref 3.5–5.0)
Alkaline Phosphatase: 81 U/L (ref 38–126)
Anion gap: 13 (ref 5–15)
BUN: 9 mg/dL (ref 8–23)
CO2: 25 mmol/L (ref 22–32)
Calcium: 9.1 mg/dL (ref 8.9–10.3)
Chloride: 102 mmol/L (ref 98–111)
Creatinine: 0.72 mg/dL (ref 0.44–1.00)
GFR, Estimated: >60 mL/min (ref >60)
Glucose, Bld: 116 mg/dL — ABNORMAL HIGH (ref 70–99)
Potassium: 3.3 mmol/L — ABNORMAL LOW (ref 3.5–5.1)
Sodium: 140 mmol/L (ref 135–145)
Total Bilirubin: 0.5 mg/dL (ref 0.3–1.2)
Total Protein: 7.5 g/dL (ref 6.5–8.1)

## 2020-06-02 LAB — CBC WITH DIFFERENTIAL (CANCER CENTER ONLY)
Abs Immature Granulocytes: 0.01 10*3/uL (ref 0.00–0.07)
Basophils Absolute: 0 10*3/uL (ref 0.0–0.1)
Basophils Relative: 1 %
Eosinophils Absolute: 0 10*3/uL (ref 0.0–0.5)
Eosinophils Relative: 1 %
HCT: 31.9 % — ABNORMAL LOW (ref 36.0–46.0)
Hemoglobin: 10.8 g/dL — ABNORMAL LOW (ref 12.0–15.0)
Immature Granulocytes: 0 %
Lymphocytes Relative: 25 %
Lymphs Abs: 0.9 10*3/uL (ref 0.7–4.0)
MCH: 31.5 pg (ref 26.0–34.0)
MCHC: 33.9 g/dL (ref 30.0–36.0)
MCV: 93 fL (ref 80.0–100.0)
Monocytes Absolute: 0.5 10*3/uL (ref 0.1–1.0)
Monocytes Relative: 13 %
Neutro Abs: 2.3 10*3/uL (ref 1.7–7.7)
Neutrophils Relative %: 60 %
Platelet Count: 300 10*3/uL (ref 150–400)
RBC: 3.43 MIL/uL — ABNORMAL LOW (ref 3.87–5.11)
RDW: 18.1 % — ABNORMAL HIGH (ref 11.5–15.5)
WBC Count: 3.7 10*3/uL — ABNORMAL LOW (ref 4.0–10.5)
nRBC: 0 % (ref 0.0–0.2)

## 2020-06-02 LAB — TSH: TSH: 0.27 u[IU]/mL — ABNORMAL LOW (ref 0.308–3.960)

## 2020-06-02 MED ORDER — SODIUM CHLORIDE 0.9% FLUSH
10.0000 mL | INTRAVENOUS | Status: DC | PRN
  Filled 2020-06-02: qty 10

## 2020-06-02 MED ORDER — PROCHLORPERAZINE MALEATE 10 MG PO TABS
10.0000 mg | ORAL_TABLET | Freq: Once | ORAL | Status: AC
  Administered 2020-06-02: 10 mg via ORAL

## 2020-06-02 MED ORDER — SODIUM CHLORIDE 0.9 % IV SOLN
500.0000 mg/m2 | Freq: Once | INTRAVENOUS | Status: AC
  Administered 2020-06-02: 1000 mg via INTRAVENOUS
  Filled 2020-06-02: qty 40

## 2020-06-02 MED ORDER — SODIUM CHLORIDE 0.9 % IV SOLN
200.0000 mg | Freq: Once | INTRAVENOUS | Status: AC
  Administered 2020-06-02: 200 mg via INTRAVENOUS
  Filled 2020-06-02: qty 8

## 2020-06-02 MED ORDER — HEPARIN SOD (PORK) LOCK FLUSH 100 UNIT/ML IV SOLN
500.0000 [IU] | Freq: Once | INTRAVENOUS | Status: DC | PRN
  Filled 2020-06-02: qty 5

## 2020-06-02 MED ORDER — PROCHLORPERAZINE MALEATE 10 MG PO TABS
ORAL_TABLET | ORAL | Status: AC
  Filled 2020-06-02: qty 1

## 2020-06-02 MED ORDER — SODIUM CHLORIDE 0.9 % IV SOLN
Freq: Once | INTRAVENOUS | Status: AC
  Filled 2020-06-02: qty 250

## 2020-06-02 NOTE — Progress Notes (Signed)
[place] [phone]   Fax:([phone]  OFFICE PROGRESS NOTE  [name], MD [place] [address] [place] [address]  DIAGNOSIS: stage IV (T1b, N1, M1 a) non-small cell lung cancer favoring adenocarcinoma diagnosed in October 2021.  The pathologic immunohistochemical stains suggestive of upper GI primary but there is no finding on the PET scan or imaging study to support this possibility.  Biomarker Findings Microsatellite status - MS-Stable Tumor Mutational Burden - 3 Muts/Mb Genomic Findings For a complete list of the genes assayed, please refer to the Appendix. KRAS G12C STK11 E145* 7 Disease relevant genes with no reportable alterations: ALK, BRAF, EGFR, ERBB2, MET, RET, ROS1  PDL1 Expression 0%  PRIOR THERAPY: None  CURRENT THERAPY: Systemic chemotherapy with carboplatin for AUC of 5, Alimta 500 mg/M2 and Keytruda 200 mg IV every 3 weeks.  First dose March 11, 2019.  Status post 4 cycles.  Starting from cycle #5 the patient will be on maintenance treatment with Alimta and Keytruda every 3 weeks.  INTERVAL HISTORY: Cindy [name] 75 y.o. female returns to the clinic today for follow-up visit.  The patient is feeling fine today with no concerning complaints except for fatigue that extended for around a week after the last cycle of her treatment.  She is feeling much better now.  She denied having any current chest pain, shortness of breath, cough or hemoptysis.  She denied having any fever or chills.  She has no nausea, vomiting, diarrhea or constipation.  She has no headache or visual changes.  She is here today for evaluation before starting cycle #5 of her treatment.  MEDICAL HISTORY: Past Medical History:  Diagnosis Date  . Anxiety   . Depression   . Hyperlipidemia   . Hypertension     ALLERGIES:  is allergic to escitalopram, povidone iodine, and procaine.  MEDICATIONS:  Current Outpatient Medications   Medication Sig Dispense Refill  . ALPRAZolam (XANAX) 0.25 MG tablet Take 0.25 mg by mouth daily as needed for anxiety.    . Ascorbic Acid (VITAMIN C PO) Take 1 tablet by mouth daily.    Marland Kitchen aspirin 81 MG tablet Take 81 mg by mouth daily.    Marland Kitchen b complex vitamins capsule Take 1 capsule by mouth 2 (two) times a week.    . calcium carbonate (TUMS - DOSED IN MG ELEMENTAL CALCIUM) 500 MG chewable tablet Chew 500-1,000 mg by mouth daily as needed for indigestion or heartburn.    . folic acid (FOLVITE) 1 MG tablet Take 1 tablet (1 mg total) by mouth daily. 30 tablet 4  . hydrochlorothiazide (HYDRODIURIL) 25 MG tablet Take 25 mg by mouth daily.    . metoprolol succinate (TOPROL-XL) 50 MG 24 hr tablet Take 50 mg by mouth daily. Take with or immediately following a meal.    . Multiple Minerals-Vitamins (CALCIUM-MAGNESIUM-ZINC-D3 PO) Take 1 tablet by mouth 2 (two) times a week.    . Multiple Vitamin (MULTIVITAMIN) tablet Take 1 tablet by mouth 3 (three) times a week.    . Polyvinyl Alcohol-Povidone (REFRESH OP) Place 1 drop into both eyes daily as needed (dry eyes).    . simvastatin (ZOCOR) 20 MG tablet Take 20 mg by mouth daily.    Marland Kitchen VITAMIN D PO Take 1 capsule by mouth 3 (three) times a week.    . Vitamin D, Ergocalciferol, (DRISDOL) 1.25 MG (50000 UNIT) CAPS capsule 1 capsule    . VITAMIN E PO Take 1  capsule by mouth 2 (two) times a week.    Marland Kitchen oxyCODONE-acetaminophen (PERCOCET/ROXICET) 5-325 MG tablet TAKE 1 TABLET BY MOUTH EVERY 6 HOURS AS NEEDED FOR SEVERE PAIN. (Patient not taking: Reported on 06/02/2020) 30 tablet 0  . prochlorperazine (COMPAZINE) 10 MG tablet TAKE 1 TABLET(10 MG) BY MOUTH EVERY 6 HOURS AS NEEDED FOR NAUSEA OR VOMITING (Patient not taking: Reported on 06/02/2020) 30 tablet 0   No current facility-administered medications for this visit.    SURGICAL HISTORY:  Past Surgical History:  Procedure Laterality Date  . DENTAL SURGERY  2012 - 2013   implants  . IR THORACENTESIS ASP PLEURAL  SPACE W/IMG GUIDE  12/19/2019  . WISDOM TOOTH EXTRACTION  20    REVIEW OF SYSTEMS:  A comprehensive review of systems was negative except for: Constitutional: positive for fatigue   PHYSICAL EXAMINATION: General appearance: alert, cooperative, fatigued and no distress Head: Normocephalic, without obvious abnormality, atraumatic Neck: no adenopathy, no JVD, supple, symmetrical, trachea midline and thyroid not enlarged, symmetric, no tenderness/mass/nodules Lymph nodes: Cervical, supraclavicular, and axillary nodes normal. Resp: clear to auscultation bilaterally Back: symmetric, no curvature. ROM normal. No CVA tenderness. Cardio: regular rate and rhythm, S1, S2 normal, no murmur, click, rub or gallop GI: soft, non-tender; bowel sounds normal; no masses,  no organomegaly Extremities: extremities normal, atraumatic, no cyanosis or edema  ECOG PERFORMANCE STATUS: 1 - Symptomatic but completely ambulatory  Blood pressure (!) 156/72, pulse 79, temperature 98.3 F (36.8 C), temperature source Tympanic, resp. rate 16, height _0  (1.626 m), weight 181 lb (82.1 kg), last menstrual period 09/29/2002, SpO2 100 %.  LABORATORY DATA: Lab Results  Component Value Date   WBC 3.7 (L) 06/02/2020   HGB 10.8 (L) 06/02/2020   HCT 31.9 (L) 06/02/2020   MCV 93.0 06/02/2020   PLT 300 06/02/2020      Chemistry      Component Value Date/Time   NA 140 06/02/2020 1032   K 3.3 (L) 06/02/2020 1032   CL 102 06/02/2020 1032   CO2 25 06/02/2020 1032   BUN 9 06/02/2020 1032   CREATININE 0.72 06/02/2020 1032      Component Value Date/Time   CALCIUM 9.1 06/02/2020 1032   ALKPHOS 81 06/02/2020 1032   AST 27 06/02/2020 1032   ALT 28 06/02/2020 1032   BILITOT 0.5 06/02/2020 1032       RADIOGRAPHIC STUDIES: CT Chest W Contrast  Result Date: 05/11/2020 CLINICAL DATA:  Stage IV non-small cell lung cancer favoring adenocarcinoma diagnosed in October. Status post chemotherapy. EXAM: CT CHEST, ABDOMEN, AND  PELVIS WITH CONTRAST TECHNIQUE: Multidetector CT imaging of the chest, abdomen and pelvis was performed following the standard protocol during bolus administration of intravenous contrast. CONTRAST:  157m OMNIPAQUE IOHEXOL 300 MG/ML  SOLN COMPARISON:  PET of 01/07/2020.  Clinic note of 04/21/2020 FINDINGS: Motion degradation involving the lower chest and abdomen is mild to moderate. CT CHEST FINDINGS Cardiovascular: Aortic atherosclerosis. Mild cardiomegaly, without pericardial effusion. Lad coronary artery calcification. No central pulmonary embolism, on this non-dedicated study. Mediastinum/Nodes: No supraclavicular adenopathy. No mediastinal adenopathy. Left hilar/infrahilar node of 1.1 cm on 29/2 is grossly similar to on the prior, but suboptimally evaluated on the noncontrast CT. A right paratracheal node measures 8 mm on 14/2 versus 4 mm on the prior. 6 mm prevascular node on 25/2 is new. Lungs/Pleura: Similar to minimal decrease in small left pleural effusion. Mild centrilobular emphysema. Anterior left upper lobe pleural-based nodule of 1.4 cm on 47/7 measures 1.5 cm  on 01/02/2020 (when remeasured). Superior segment left lower lobe pulmonary nodule measures 1.4 cm on 61/7 versus similar on the prior exam (when remeasured). 3 mm subpleural right lower lobe pulmonary nodule on 76/7 is similar. Underlying pattern of irregular left lower lobe septal thickening is relatively similar and suspicious for lymphangitic tumor spread. Musculoskeletal: New sclerotic foci including within the posterior portion of T2 on 10/02 and the left medial clavicle on 11/02. CT ABDOMEN PELVIS FINDINGS Hepatobiliary: Motion degradation. Normal liver. Normal gallbladder, without biliary ductal dilatation. Pancreas: Grossly normal pancreas, without duct dilatation or acute inflammation. Spleen: Normal in size, without focal abnormality. Adrenals/Urinary Tract: Normal adrenal glands. Normal kidneys, without hydronephrosis. Normal  urinary bladder. Stomach/Bowel: Normal stomach, without wall thickening. Scattered colonic diverticula. Normal terminal ileum and appendix. Normal small bowel. Vascular/Lymphatic: Aortic atherosclerosis. No abdominopelvic adenopathy. Reproductive: Normal uterus and adnexa. Other: No significant free fluid. Mild pelvic floor laxity. No evidence of omental or peritoneal disease. Musculoskeletal: New right iliac sclerotic lesion of 1.1 cm on 94/2. Superior posterior L3 sclerotic lesion measures 1.4 cm on sagittal image 104. IMPRESSION: 1. Motion degraded evaluation of the lower chest and upper abdomen. 2. Relatively similar appearance of pleural and parenchymal left-sided metastatic disease as detailed above. 3. Left infrahilar adenopathy is grossly similar. Small but enlarging right paratracheal and prevascular mediastinal nodes for which progressive nodal metastasis is a concern. 4. New sclerotic osseous metastasis. This either represents new metastatic disease or interval healing of previously occult metastatic disease. 5. No soft tissue metastasis within the abdomen or pelvis. 6. Aortic atherosclerosis (ICD10-I70.0), coronary artery atherosclerosis and emphysema (ICD10-J43.9). Electronically Signed   By: Abigail Miyamoto M.D.   On: 05/11/2020 16:39   CT Abdomen Pelvis W Contrast  Result Date: 05/11/2020 CLINICAL DATA:  Stage IV non-small cell lung cancer favoring adenocarcinoma diagnosed in October. Status post chemotherapy. EXAM: CT CHEST, ABDOMEN, AND PELVIS WITH CONTRAST TECHNIQUE: Multidetector CT imaging of the chest, abdomen and pelvis was performed following the standard protocol during bolus administration of intravenous contrast. CONTRAST:  11m OMNIPAQUE IOHEXOL 300 MG/ML  SOLN COMPARISON:  PET of 01/07/2020.  Clinic note of 04/21/2020 FINDINGS: Motion degradation involving the lower chest and abdomen is mild to moderate. CT CHEST FINDINGS Cardiovascular: Aortic atherosclerosis. Mild cardiomegaly,  without pericardial effusion. Lad coronary artery calcification. No central pulmonary embolism, on this non-dedicated study. Mediastinum/Nodes: No supraclavicular adenopathy. No mediastinal adenopathy. Left hilar/infrahilar node of 1.1 cm on 29/2 is grossly similar to on the prior, but suboptimally evaluated on the noncontrast CT. A right paratracheal node measures 8 mm on 14/2 versus 4 mm on the prior. 6 mm prevascular node on 25/2 is new. Lungs/Pleura: Similar to minimal decrease in small left pleural effusion. Mild centrilobular emphysema. Anterior left upper lobe pleural-based nodule of 1.4 cm on 47/7 measures 1.5 cm on 01/02/2020 (when remeasured). Superior segment left lower lobe pulmonary nodule measures 1.4 cm on 61/7 versus similar on the prior exam (when remeasured). 3 mm subpleural right lower lobe pulmonary nodule on 76/7 is similar. Underlying pattern of irregular left lower lobe septal thickening is relatively similar and suspicious for lymphangitic tumor spread. Musculoskeletal: New sclerotic foci including within the posterior portion of T2 on 10/02 and the left medial clavicle on 11/02. CT ABDOMEN PELVIS FINDINGS Hepatobiliary: Motion degradation. Normal liver. Normal gallbladder, without biliary ductal dilatation. Pancreas: Grossly normal pancreas, without duct dilatation or acute inflammation. Spleen: Normal in size, without focal abnormality. Adrenals/Urinary Tract: Normal adrenal glands. Normal kidneys, without hydronephrosis. Normal urinary bladder.  Stomach/Bowel: Normal stomach, without wall thickening. Scattered colonic diverticula. Normal terminal ileum and appendix. Normal small bowel. Vascular/Lymphatic: Aortic atherosclerosis. No abdominopelvic adenopathy. Reproductive: Normal uterus and adnexa. Other: No significant free fluid. Mild pelvic floor laxity. No evidence of omental or peritoneal disease. Musculoskeletal: New right iliac sclerotic lesion of 1.1 cm on 94/2. Superior posterior L3  sclerotic lesion measures 1.4 cm on sagittal image 104. IMPRESSION: 1. Motion degraded evaluation of the lower chest and upper abdomen. 2. Relatively similar appearance of pleural and parenchymal left-sided metastatic disease as detailed above. 3. Left infrahilar adenopathy is grossly similar. Small but enlarging right paratracheal and prevascular mediastinal nodes for which progressive nodal metastasis is a concern. 4. New sclerotic osseous metastasis. This either represents new metastatic disease or interval healing of previously occult metastatic disease. 5. No soft tissue metastasis within the abdomen or pelvis. 6. Aortic atherosclerosis (ICD10-I70.0), coronary artery atherosclerosis and emphysema (ICD10-J43.9). Electronically Signed   By: Abigail Miyamoto M.D.   On: 05/11/2020 16:39    ASSESSMENT AND PLAN: This is a very pleasant 75 years old white female recently diagnosed with a stage IV (T1b, N2, M1 a) non-small cell lung cancer, adenocarcinoma with no actionable mutations based on the molecular studies by [facility]. The patient underwent CT-guided core biopsy of one of the left lung pleural-based nodules by interventional radiology and the final pathology was consistent with metastatic adenocarcinoma. Her tissue block was [facility] to [facility] for molecular studies and PD-L1 expression.  The molecular studies by [facility] showed positive K-ras G12C mutation which will be an option for targeted therapy in the second line setting with Lumakras (Sotorasib). The patient is currently undergoing palliative systemic chemotherapy with carboplatin for AUC of 5, Alimta 500 mg/M2 and Keytruda 200 mg IV every 3 weeks.  Status post 4 cycles.  Starting from cycle #5 the patient will be on maintenance treatment with Alimta and Keytruda every 3 weeks.  She has been tolerating her treatment well with no concerning complaints except for the extended fatigue. I recommended for the patient to proceed with cycle #5  today as planned. For pain management she will continue on Percocet on as-needed basis. I will see her back for follow-up visit in 3 weeks for evaluation before the next cycle of her treatment. She was advised to call immediately if she has any concerning symptoms in the interval. The patient voices understanding of current disease status and treatment options and is in agreement with the current care plan.  All questions were answered. The patient knows to call the clinic with any problems, questions or concerns. We can certainly see the patient much sooner if necessary.  Disclaimer: This note was dictated with voice recognition software. Similar sounding words can inadvertently be transcribed and may not be corrected upon review.

## 2020-06-02 NOTE — Patient Instructions (Signed)
[facility] Discharge Instructions for Patients Receiving Chemotherapy  Today you received the following chemotherapy agents Pembrolizumab(Keytruda), Pemetrexed(Alimta).  To help prevent nausea and vomiting after your treatment, we encourage you to take your nausea medication as directed.   If you develop nausea and vomiting that is not controlled by your nausea medication, call the clinic.   BELOW ARE SYMPTOMS THAT SHOULD BE REPORTED IMMEDIATELY:  *FEVER GREATER THAN 100.5 F  *CHILLS WITH OR WITHOUT FEVER  NAUSEA AND VOMITING THAT IS NOT CONTROLLED WITH YOUR NAUSEA MEDICATION  *UNUSUAL SHORTNESS OF BREATH  *UNUSUAL BRUISING OR BLEEDING  TENDERNESS IN MOUTH AND THROAT WITH OR WITHOUT PRESENCE OF ULCERS  *URINARY PROBLEMS  *BOWEL PROBLEMS  UNUSUAL RASH Items with * indicate a potential emergency and should be followed up as soon as possible.  Feel free to call the clinic should you have any questions or concerns. The clinic phone number is [phone].  Please show the [facility] at check-in to the Emergency Department and triage nurse.

## 2020-06-04 ENCOUNTER — Telehealth: Payer: Self-pay | Admitting: Internal Medicine

## 2020-06-04 NOTE — Telephone Encounter (Signed)
Scheduled per los. Called and spoke with patient. Confirmed appts  

## 2020-06-09 ENCOUNTER — Other Ambulatory Visit: Payer: Medicare PPO

## 2020-06-16 ENCOUNTER — Other Ambulatory Visit: Payer: Medicare PPO

## 2020-06-17 DIAGNOSIS — F39 Unspecified mood [affective] disorder: Secondary | ICD-10-CM | POA: Diagnosis not present

## 2020-06-17 DIAGNOSIS — C801 Malignant (primary) neoplasm, unspecified: Secondary | ICD-10-CM | POA: Diagnosis not present

## 2020-06-17 DIAGNOSIS — C3490 Malignant neoplasm of unspecified part of unspecified bronchus or lung: Secondary | ICD-10-CM | POA: Diagnosis not present

## 2020-06-22 ENCOUNTER — Other Ambulatory Visit: Payer: Self-pay | Admitting: Physician Assistant

## 2020-06-22 DIAGNOSIS — C3492 Malignant neoplasm of unspecified part of left bronchus or lung: Secondary | ICD-10-CM

## 2020-06-23 ENCOUNTER — Inpatient Hospital Stay: Payer: Medicare PPO

## 2020-06-23 ENCOUNTER — Inpatient Hospital Stay: Payer: Medicare PPO | Admitting: Internal Medicine

## 2020-06-23 ENCOUNTER — Other Ambulatory Visit: Payer: Self-pay

## 2020-06-23 ENCOUNTER — Encounter: Payer: Self-pay | Admitting: *Deleted

## 2020-06-23 VITALS — BP 137/66 | HR 72 | Temp 97.8°F | Resp 18 | Ht 64.0 in | Wt 180.9 lb

## 2020-06-23 DIAGNOSIS — Z5111 Encounter for antineoplastic chemotherapy: Secondary | ICD-10-CM | POA: Diagnosis not present

## 2020-06-23 DIAGNOSIS — C3492 Malignant neoplasm of unspecified part of left bronchus or lung: Secondary | ICD-10-CM

## 2020-06-23 DIAGNOSIS — Z79899 Other long term (current) drug therapy: Secondary | ICD-10-CM | POA: Diagnosis not present

## 2020-06-23 DIAGNOSIS — C3490 Malignant neoplasm of unspecified part of unspecified bronchus or lung: Secondary | ICD-10-CM

## 2020-06-23 DIAGNOSIS — Z5112 Encounter for antineoplastic immunotherapy: Secondary | ICD-10-CM | POA: Diagnosis not present

## 2020-06-23 DIAGNOSIS — C3491 Malignant neoplasm of unspecified part of right bronchus or lung: Secondary | ICD-10-CM | POA: Diagnosis not present

## 2020-06-23 LAB — CMP (CANCER CENTER ONLY)
ALT: 25 U/L (ref 0–44)
AST: 29 U/L (ref 15–41)
Albumin: 3.8 g/dL (ref 3.5–5.0)
Alkaline Phosphatase: 71 U/L (ref 38–126)
Anion gap: 10 (ref 5–15)
BUN: 11 mg/dL (ref 8–23)
CO2: 27 mmol/L (ref 22–32)
Calcium: 9.2 mg/dL (ref 8.9–10.3)
Chloride: 103 mmol/L (ref 98–111)
Creatinine: 0.7 mg/dL (ref 0.44–1.00)
GFR, Estimated: >60 mL/min (ref >60)
Glucose, Bld: 88 mg/dL (ref 70–99)
Potassium: 3.3 mmol/L — ABNORMAL LOW (ref 3.5–5.1)
Sodium: 140 mmol/L (ref 135–145)
Total Bilirubin: 0.5 mg/dL (ref 0.3–1.2)
Total Protein: 7.2 g/dL (ref 6.5–8.1)

## 2020-06-23 LAB — CBC WITH DIFFERENTIAL (CANCER CENTER ONLY)
Abs Immature Granulocytes: 0.01 10*3/uL (ref 0.00–0.07)
Basophils Absolute: 0 10*3/uL (ref 0.0–0.1)
Basophils Relative: 1 %
Eosinophils Absolute: 0.1 10*3/uL (ref 0.0–0.5)
Eosinophils Relative: 1 %
HCT: 32.4 % — ABNORMAL LOW (ref 36.0–46.0)
Hemoglobin: 11 g/dL — ABNORMAL LOW (ref 12.0–15.0)
Immature Granulocytes: 0 %
Lymphocytes Relative: 19 %
Lymphs Abs: 0.8 10*3/uL (ref 0.7–4.0)
MCH: 33 pg (ref 26.0–34.0)
MCHC: 34 g/dL (ref 30.0–36.0)
MCV: 97.3 fL (ref 80.0–100.0)
Monocytes Absolute: 0.5 10*3/uL (ref 0.1–1.0)
Monocytes Relative: 11 %
Neutro Abs: 2.8 10*3/uL (ref 1.7–7.7)
Neutrophils Relative %: 68 %
Platelet Count: 329 10*3/uL (ref 150–400)
RBC: 3.33 MIL/uL — ABNORMAL LOW (ref 3.87–5.11)
RDW: 16.4 % — ABNORMAL HIGH (ref 11.5–15.5)
WBC Count: 4.2 10*3/uL (ref 4.0–10.5)
nRBC: 0 % (ref 0.0–0.2)

## 2020-06-23 LAB — TSH: TSH: 1.496 u[IU]/mL (ref 0.308–3.960)

## 2020-06-23 MED ORDER — SODIUM CHLORIDE 0.9 % IV SOLN
200.0000 mg | Freq: Once | INTRAVENOUS | Status: AC
  Administered 2020-06-23: 200 mg via INTRAVENOUS
  Filled 2020-06-23: qty 8

## 2020-06-23 MED ORDER — CYANOCOBALAMIN 1000 MCG/ML IJ SOLN
INTRAMUSCULAR | Status: AC
  Filled 2020-06-23: qty 1

## 2020-06-23 MED ORDER — CYANOCOBALAMIN 1000 MCG/ML IJ SOLN
1000.0000 ug | Freq: Once | INTRAMUSCULAR | Status: AC
  Administered 2020-06-23: 1000 ug via INTRAMUSCULAR

## 2020-06-23 MED ORDER — SODIUM CHLORIDE 0.9 % IV SOLN
Freq: Once | INTRAVENOUS | Status: AC
  Filled 2020-06-23: qty 250

## 2020-06-23 MED ORDER — SODIUM CHLORIDE 0.9 % IV SOLN
500.0000 mg/m2 | Freq: Once | INTRAVENOUS | Status: AC
  Administered 2020-06-23: 1000 mg via INTRAVENOUS
  Filled 2020-06-23: qty 40

## 2020-06-23 MED ORDER — PROCHLORPERAZINE MALEATE 10 MG PO TABS
ORAL_TABLET | ORAL | Status: AC
  Filled 2020-06-23: qty 1

## 2020-06-23 MED ORDER — PROCHLORPERAZINE MALEATE 10 MG PO TABS
10.0000 mg | ORAL_TABLET | Freq: Once | ORAL | Status: AC
  Administered 2020-06-23: 10 mg via ORAL

## 2020-06-23 NOTE — Patient Instructions (Addendum)
[facility] ONCOLOGY  Discharge Instructions: Thank you for choosing [facility] to provide your oncology and hematology care.   If you have a lab appointment with the [facility], please go directly to the [facility] and check in at the registration area.   Wear comfortable clothing and clothing appropriate for easy access to any Portacath or PICC line.   We strive to give you quality time with your provider. You may need to reschedule your appointment if you arrive late (15 or more minutes).  Arriving late affects you and other patients whose appointments are after yours.  Also, if you miss three or more appointments without notifying the office, you may be dismissed from the clinic at the provider's discretion.      For prescription refill requests, have your pharmacy contact our office and allow 72 hours for refills to be completed.    Today you received the following chemotherapy and/or immunotherapy agents Keytruda and Alimta     To help prevent nausea and vomiting after your treatment, we encourage you to take your nausea medication as directed.  BELOW ARE SYMPTOMS THAT SHOULD BE REPORTED IMMEDIATELY: . *FEVER GREATER THAN 100.4 F (38 C) OR HIGHER . *CHILLS OR SWEATING . *NAUSEA AND VOMITING THAT IS NOT CONTROLLED WITH YOUR NAUSEA MEDICATION . *UNUSUAL SHORTNESS OF BREATH . *UNUSUAL BRUISING OR BLEEDING . *URINARY PROBLEMS (pain or burning when urinating, or frequent urination) . *BOWEL PROBLEMS (unusual diarrhea, constipation, pain near the anus) . TENDERNESS IN MOUTH AND THROAT WITH OR WITHOUT PRESENCE OF ULCERS (sore throat, sores in mouth, or a toothache) . UNUSUAL RASH, SWELLING OR PAIN  . UNUSUAL VAGINAL DISCHARGE OR ITCHING   Items with * indicate a potential emergency and should be followed up as soon as possible or go to the Emergency Department if any problems should occur.  Please show the CHEMOTHERAPY ALERT CARD or  IMMUNOTHERAPY ALERT CARD at check-in to the Emergency Department and triage nurse.  Should you have questions after your visit or need to cancel or reschedule your appointment, please contact [facility]  Dept: [phone]  and follow the prompts.  Office hours are 8:00 a.m. to 4:30 p.m. Monday - Friday. Please note that voicemails left after 4:00 p.m. may not be returned until the following business day.  We are closed weekends and major holidays. You have access to a nurse at all times for urgent questions. Please call the main number to the clinic Dept: [phone] and follow the prompts.   For any non-urgent questions, you may also contact your provider using MyChart. We now offer e-Visits for anyone 4 and older to request care online for non-urgent symptoms. For details visit [email or web address].   Also download the MyChart app! Go to the app store, search "MyChart", open the app, select [facility], and log in with your MyChart username and password.  Due to Covid, a mask is required upon entering the hospital/clinic. If you do not have a mask, one will be given to you upon arrival. For doctor visits, patients may have 1 support person aged 33 or older with them. For treatment visits, patients cannot have anyone with them due to current Covid guidelines and our immunocompromised population.

## 2020-06-23 NOTE — Progress Notes (Signed)
[place] [phone]   [phone]  OFFICE PROGRESS NOTE  [name], MD [place] [address] [place] [address]  DIAGNOSIS: stage IV (T1b, N1, M1 a) non-small cell lung cancer favoring adenocarcinoma diagnosed in October 2021.  The pathologic immunohistochemical stains suggestive of upper GI primary but there is no finding on the PET scan or imaging study to support this possibility.  Biomarker Findings Microsatellite status - MS-Stable Tumor Mutational Burden - 3 Muts/Mb Genomic Findings For a complete list of the genes assayed, please refer to the Appendix. KRAS G12C STK11 E145* 7 Disease relevant genes with no reportable alterations: ALK, BRAF, EGFR, ERBB2, MET, RET, ROS1  PDL1 Expression 0%  PRIOR THERAPY: None  CURRENT THERAPY: Systemic chemotherapy with carboplatin for AUC of 5, Alimta 500 mg/M2 and Keytruda 200 mg IV every 3 weeks.  First dose March 11, 2019.  Status post 5 cycles.  Starting from cycle #5 the patient will be on maintenance treatment with Alimta and Keytruda every 3 weeks.  INTERVAL HISTORY: Cindy [name] 75 y.o. female returns to the clinic today for follow-up visit.  The patient is feeling fine today with no concerning complaints but she is a little bit down today.  She had a lot of fatigue after the last cycle of her treatment that lasted for almost a week.  She denied having any current chest pain but has shortness of breath with exertion with no cough or hemoptysis.  She denied having any fever or chills.  She has no nausea, vomiting, diarrhea or constipation.  She is here today for evaluation before starting cycle #6 of her treatment.  MEDICAL HISTORY: Past Medical History:  Diagnosis Date  . Anxiety   . Depression   . Hyperlipidemia   . Hypertension     ALLERGIES:  is allergic to escitalopram, povidone iodine, and procaine.  MEDICATIONS:  Current Outpatient Medications  Medication  Sig Dispense Refill  . ALPRAZolam (XANAX) 0.25 MG tablet Take 0.25 mg by mouth daily as needed for anxiety.    . Ascorbic Acid (VITAMIN C PO) Take 1 tablet by mouth daily.    Marland Kitchen aspirin 81 MG tablet Take 81 mg by mouth daily.    Marland Kitchen b complex vitamins capsule Take 1 capsule by mouth 2 (two) times a week.    . calcium carbonate (TUMS - DOSED IN MG ELEMENTAL CALCIUM) 500 MG chewable tablet Chew 500-1,000 mg by mouth daily as needed for indigestion or heartburn.    . folic acid (FOLVITE) 1 MG tablet Take 1 tablet (1 mg total) by mouth daily. 30 tablet 4  . hydrochlorothiazide (HYDRODIURIL) 25 MG tablet Take 25 mg by mouth daily.    . metoprolol succinate (TOPROL-XL) 50 MG 24 hr tablet Take 50 mg by mouth daily. Take with or immediately following a meal.    . Multiple Minerals-Vitamins (CALCIUM-MAGNESIUM-ZINC-D3 PO) Take 1 tablet by mouth 2 (two) times a week.    . Multiple Vitamin (MULTIVITAMIN) tablet Take 1 tablet by mouth 3 (three) times a week.    Marland Kitchen oxyCODONE-acetaminophen (PERCOCET/ROXICET) 5-325 MG tablet TAKE 1 TABLET BY MOUTH EVERY 6 HOURS AS NEEDED FOR SEVERE PAIN. (Patient not taking: Reported on 06/02/2020) 30 tablet 0  . Polyvinyl Alcohol-Povidone (REFRESH OP) Place 1 drop into both eyes daily as needed (dry eyes).    . prochlorperazine (COMPAZINE) 10 MG tablet TAKE 1 TABLET(10 MG) BY MOUTH EVERY 6 HOURS AS NEEDED FOR NAUSEA OR VOMITING (Patient  not taking: Reported on 06/02/2020) 30 tablet 0  . simvastatin (ZOCOR) 20 MG tablet Take 20 mg by mouth daily.    Marland Kitchen VITAMIN D PO Take 1 capsule by mouth 3 (three) times a week.    . Vitamin D, Ergocalciferol, (DRISDOL) 1.25 MG (50000 UNIT) CAPS capsule 1 capsule    . VITAMIN E PO Take 1 capsule by mouth 2 (two) times a week.     No current facility-administered medications for this visit.    SURGICAL HISTORY:  Past Surgical History:  Procedure Laterality Date  . DENTAL SURGERY  2012 - 2013   implants  . IR THORACENTESIS ASP PLEURAL SPACE W/IMG  GUIDE  12/19/2019  . WISDOM TOOTH EXTRACTION  20    REVIEW OF SYSTEMS:  A comprehensive review of systems was negative except for: Constitutional: positive for fatigue Respiratory: positive for dyspnea on exertion   PHYSICAL EXAMINATION: General appearance: alert, cooperative, fatigued and no distress Head: Normocephalic, without obvious abnormality, atraumatic Neck: no adenopathy, no JVD, supple, symmetrical, trachea midline and thyroid not enlarged, symmetric, no tenderness/mass/nodules Lymph nodes: Cervical, supraclavicular, and axillary nodes normal. Resp: diminished breath sounds LLL and dullness to percussion LLL Back: symmetric, no curvature. ROM normal. No CVA tenderness. Cardio: regular rate and rhythm, S1, S2 normal, no murmur, click, rub or gallop GI: soft, non-tender; bowel sounds normal; no masses,  no organomegaly Extremities: extremities normal, atraumatic, no cyanosis or edema  ECOG PERFORMANCE STATUS: 1 - Symptomatic but completely ambulatory  Blood pressure 137/66, pulse 72, temperature 97.8 F (36.6 C), temperature source Tympanic, resp. rate 18, height 5' 4" (1.626 m), weight 180 lb 14.4 oz (82.1 kg), last menstrual period 09/29/2002, SpO2 96 %.  LABORATORY DATA: Lab Results  Component Value Date   WBC 4.2 06/23/2020   HGB 11.0 (L) 06/23/2020   HCT 32.4 (L) 06/23/2020   MCV 97.3 06/23/2020   PLT 329 06/23/2020      Chemistry      Component Value Date/Time   NA 140 06/02/2020 1032   K 3.3 (L) 06/02/2020 1032   CL 102 06/02/2020 1032   CO2 25 06/02/2020 1032   BUN 9 06/02/2020 1032   CREATININE 0.72 06/02/2020 1032      Component Value Date/Time   CALCIUM 9.1 06/02/2020 1032   ALKPHOS 81 06/02/2020 1032   AST 27 06/02/2020 1032   ALT 28 06/02/2020 1032   BILITOT 0.5 06/02/2020 1032       RADIOGRAPHIC STUDIES: No results found.  ASSESSMENT AND PLAN: This is a very pleasant 75 years old white female recently diagnosed with a stage IV (T1b, N2, M1  a) non-small cell lung cancer, adenocarcinoma with no actionable mutations based on the molecular studies by [facility]. The patient underwent CT-guided core biopsy of one of the left lung pleural-based nodules by interventional radiology and the final pathology was consistent with metastatic adenocarcinoma. Her tissue block was sent to [facility] for molecular studies and PD-L1 expression.  The molecular studies by [facility] showed positive K-ras G12C mutation which will be an option for targeted therapy in the second line setting with Lumakras (Sotorasib). The patient is currently undergoing palliative systemic chemotherapy with carboplatin for AUC of 5, Alimta 500 mg/M2 and Keytruda 200 mg IV every 3 weeks.  Status post 5 cycles.  Starting from cycle #5 the patient will be on maintenance treatment with Alimta and Keytruda every 3 weeks. The patient continues to tolerate this treatment well except for fatigue that lasted a  little bit longer after the last cycle of the treatment. I recommended for her to proceed with cycle #6 today as planned. I will see her back for follow-up visit in 3 weeks for evaluation with repeat CT scan of the chest, abdomen pelvis for restaging of her disease. For pain management she will continue on Percocet on as-needed basis. For the malnutrition, I will refer the patient to the dietitian at the cancer center for evaluation. For the depression I offered the patient treatment with Remeron but she tried it in the past and did not work for her.  I also encouraged the patient to join the support group at the cancer center. She was advised to call immediately if she has any concerning symptoms in the interval. The patient voices understanding of current disease status and treatment options and is in agreement with the current care plan.  All questions were answered. The patient knows to call the clinic with any problems, questions or concerns. We can certainly see the  patient much sooner if necessary.  Disclaimer: This note was dictated with voice recognition software. Similar sounding words can inadvertently be transcribed and may not be corrected upon review.

## 2020-06-23 NOTE — Progress Notes (Signed)
I spoke with Cindy [name] today during her clinic visit with Dr. Julien Nordmann. She would like to talk with a dietitian.  I completed referral and explained she will get a call from them.

## 2020-06-24 ENCOUNTER — Telehealth: Payer: Self-pay | Admitting: Internal Medicine

## 2020-06-24 NOTE — Telephone Encounter (Signed)
Sch per 4/26 sch msg, Patient aware

## 2020-07-01 DIAGNOSIS — E559 Vitamin D deficiency, unspecified: Secondary | ICD-10-CM | POA: Diagnosis not present

## 2020-07-02 ENCOUNTER — Ambulatory Visit: Payer: Medicare PPO | Admitting: Pulmonary Disease

## 2020-07-02 ENCOUNTER — Other Ambulatory Visit: Payer: Self-pay

## 2020-07-02 ENCOUNTER — Encounter: Payer: Self-pay | Admitting: Pulmonary Disease

## 2020-07-02 VITALS — BP 116/64 | HR 73 | Temp 97.8°F | Ht 64.0 in | Wt 179.4 lb

## 2020-07-02 DIAGNOSIS — R5383 Other fatigue: Secondary | ICD-10-CM

## 2020-07-02 DIAGNOSIS — R0602 Shortness of breath: Secondary | ICD-10-CM | POA: Diagnosis not present

## 2020-07-02 DIAGNOSIS — Z7189 Other specified counseling: Secondary | ICD-10-CM

## 2020-07-02 DIAGNOSIS — Z87891 Personal history of nicotine dependence: Secondary | ICD-10-CM | POA: Diagnosis not present

## 2020-07-02 DIAGNOSIS — J910 Malignant pleural effusion: Secondary | ICD-10-CM

## 2020-07-02 DIAGNOSIS — C3492 Malignant neoplasm of unspecified part of left bronchus or lung: Secondary | ICD-10-CM | POA: Diagnosis not present

## 2020-07-02 NOTE — Patient Instructions (Signed)
Thank you for visiting Dr. Valeta Harms at [facility]. Today we recommend the following:  Return in about 6 months (around 01/02/2021).    Please do your part to reduce the spread of COVID-19.

## 2020-07-02 NOTE — Progress Notes (Signed)
Synopsis: Referred in November 2021 for pleural effusion by Maurice Small, MD  Subjective:   PATIENT ID: Cindy [name] GENDER: female DOB: [birth year], MRN: [id number]  Chief Complaint  Patient presents with  . Follow-up    Pt states that she has had complaints of SOB. States she is afraid that her left lung is full of fluid again. Also has complaints of coughing.    This is a 75 year old female past medical history of anxiety, hyperlipidemia, hypertension, former smoker, 15+ years, 1ppd.  Patient referred for evaluation of pleural effusion.  Patient underwent CT scan of the chest on 12/18/2019 which revealed progressive enlargement of a left-sided pleural effusion.  She also had indeterminate pulmonary nodules within the right lung 5 mm in size. History of skin cancers, grandmother with lung cancer.  OV 01/16/2020: Patient here today for follow-up after recent nuclear medicine PET scan.  Pleural effusion tapped again recently cytology also consistent with malignant effusion.  We reviewed the patient's case this morning in the medical thoracic oncology conference.  Case was discussed needing additional tissue for molecular cytogenetics.  We discussed today in the office bronchoscopy versus CT-guided biopsy.  I think last resort would be thoracoscopy with pleural biopsy if needed.  OV 03/19/2020: recently started chemo following diagnosis of lung cancer.  Patient has subsequently established care with Dr. Shirline Frees.  Diagnosis of stage IV (T1b, N2, M1A (non-small cell lung cancer favoring adenocarcinoma following a thoracentesis with malignant pleural effusion.  Patient had biomarker testing complete.  Patient was started on Alimta plus carboplatin plus Keytruda.  She had her first cycle last week.  Overall doing well.  [facility] analysis did show positive K-ras G1 to see mutation.  At this time she is occasionally feeling fatigued.  She feels better than she did last week.  She is also  dealing with some constipation.  From respiratory standpoint she still feels short of breath with exertion.  I explained today we would look with ultrasound to ensure malignant pleural effusion has not gotten worse.  OV 07/02/2020: Here today for follow-up.  Has malignant pleural effusion.  Follow-up with Dr. Shirline Frees.  Undergoing chemotherapy and immunotherapy for palliation.  Office ultrasound today with no significant reaccumulation of pleural fluid.  She has been losing weight.  She is concerned about how chemo is making her feel at this time.  We had a lengthy discussion about this today in the office.    Past Medical History:  Diagnosis Date  . Anxiety   . Depression   . Hyperlipidemia   . Hypertension      Family History  Problem Relation Age of Onset  . Dementia Mother   . COPD Mother   . Lung cancer Maternal Grandmother   . Cirrhosis Brother      Past Surgical History:  Procedure Laterality Date  . DENTAL SURGERY  2012 - 2013   implants  . IR THORACENTESIS ASP PLEURAL SPACE W/IMG GUIDE  12/19/2019  . WISDOM TOOTH EXTRACTION  20    Social History   Socioeconomic History  . Marital status: Widowed    Spouse name: Not on file  . Number of children: 0  . Years of education: Not on file  . Highest education level: Not on file  Occupational History    Employer: [employer]  Tobacco Use  . Smoking status: Former Smoker    Packs/day: 2.00    Years: 15.00    Pack years: 30.00    Types: Cigarettes  Quit date: 80    Years since quitting: 37.3  . Smokeless tobacco: Never Used  Substance and Sexual Activity  . Alcohol use: Yes    Alcohol/week: 10.0 standard drinks    Types: 10 Standard drinks or equivalent per week  . Drug use: No  . Sexual activity: Never    Birth control/protection: Abstinence, Post-menopausal  Other Topics Concern  . Not on file  Social History Narrative  . Not on file   Social Determinants of Health   Financial Resource Strain: Not  on file  Food Insecurity: Not on file  Transportation Needs: Not on file  Physical Activity: Not on file  Stress: Not on file  Social Connections: Not on file  Intimate Partner Violence: Not on file     Allergies  Allergen Reactions  . Escitalopram      sweating, headaches Other reaction(s): sweating, headaches  . Povidone Iodine     itching  . Procaine Palpitations     Outpatient Medications Prior to Visit  Medication Sig Dispense Refill  . ALPRAZolam (XANAX) 0.25 MG tablet Take 0.25 mg by mouth daily as needed for anxiety.    . Ascorbic Acid (VITAMIN C PO) Take 1 tablet by mouth daily.    Marland Kitchen aspirin 81 MG tablet Take 81 mg by mouth daily.    Marland Kitchen b complex vitamins capsule Take 1 capsule by mouth 2 (two) times a week.    . calcium carbonate (TUMS - DOSED IN MG ELEMENTAL CALCIUM) 500 MG chewable tablet Chew 500-1,000 mg by mouth daily as needed for indigestion or heartburn.    . folic acid (FOLVITE) 1 MG tablet Take 1 tablet (1 mg total) by mouth daily. 30 tablet 4  . hydrochlorothiazide (HYDRODIURIL) 25 MG tablet Take 25 mg by mouth daily.    . melatonin 3 MG TABS tablet at bedtime as needed.    . metoprolol succinate (TOPROL-XL) 50 MG 24 hr tablet Take 50 mg by mouth daily. Take with or immediately following a meal.    . Multiple Minerals-Vitamins (CALCIUM-MAGNESIUM-ZINC-D3 PO) Take 1 tablet by mouth 2 (two) times a week.    . Multiple Vitamin (MULTIVITAMIN) tablet Take 1 tablet by mouth 3 (three) times a week.    Marland Kitchen oxyCODONE-acetaminophen (PERCOCET/ROXICET) 5-325 MG tablet TAKE 1 TABLET BY MOUTH EVERY 6 HOURS AS NEEDED FOR SEVERE PAIN. 30 tablet 0  . Polyvinyl Alcohol-Povidone (REFRESH OP) Place 1 drop into both eyes daily as needed (dry eyes).    . prochlorperazine (COMPAZINE) 10 MG tablet TAKE 1 TABLET(10 MG) BY MOUTH EVERY 6 HOURS AS NEEDED FOR NAUSEA OR VOMITING 30 tablet 0  . simvastatin (ZOCOR) 20 MG tablet Take 20 mg by mouth daily.    Marland Kitchen VITAMIN D PO Take 1 capsule by mouth  3 (three) times a week.    Marland Kitchen VITAMIN E PO Take 1 capsule by mouth 2 (two) times a week.     No facility-administered medications prior to visit.    Review of Systems  Constitutional: Negative for chills, fever, malaise/fatigue and weight loss.  HENT: Negative for hearing loss, sore throat and tinnitus.   Eyes: Negative for blurred vision and double vision.  Respiratory: Positive for shortness of breath. Negative for cough, hemoptysis, sputum production, wheezing and stridor.   Cardiovascular: Negative for chest pain, palpitations, orthopnea, leg swelling and PND.  Gastrointestinal: Negative for abdominal pain, constipation, diarrhea, heartburn, nausea and vomiting.  Genitourinary: Negative for dysuria, hematuria and urgency.  Musculoskeletal: Negative for joint pain and  myalgias.  Skin: Negative for itching and rash.  Neurological: Negative for dizziness, tingling, weakness and headaches.  Endo/Heme/Allergies: Negative for environmental allergies. Does not bruise/bleed easily.  Psychiatric/Behavioral: Negative for depression. The patient is not nervous/anxious and does not have insomnia.   All other systems reviewed and are negative.   Objective:  Physical Exam Vitals reviewed.  Constitutional:      General: She is not in acute distress.    Appearance: She is well-developed.  HENT:     Head: Normocephalic and atraumatic.  Eyes:     General: No scleral icterus.    Conjunctiva/sclera: Conjunctivae normal.     Pupils: Pupils are equal, round, and reactive to light.  Neck:     Vascular: No JVD.     Trachea: No tracheal deviation.  Cardiovascular:     Rate and Rhythm: Normal rate and regular rhythm.     Heart sounds: Normal heart sounds. No murmur heard.   Pulmonary:     Effort: Pulmonary effort is normal. No tachypnea, accessory muscle usage or respiratory distress.     Breath sounds: No stridor. No wheezing, rhonchi or rales.     Comments: Diminished breath sounds in the  left base Abdominal:     General: Bowel sounds are normal. There is no distension.     Palpations: Abdomen is soft.     Tenderness: There is no abdominal tenderness.  Musculoskeletal:        General: No tenderness.     Cervical back: Neck supple.  Lymphadenopathy:     Cervical: No cervical adenopathy.  Skin:    General: Skin is warm and dry.     Capillary Refill: Capillary refill takes less than 2 seconds.     Findings: No rash.  Neurological:     Mental Status: She is alert and oriented to person, place, and time.  Psychiatric:        Behavior: Behavior normal.     Vitals:   07/02/20 1153  BP: 116/64  Pulse: 73  Temp: 97.8 F (36.6 C)  TempSrc: Temporal  SpO2: 98%  Weight: 179 lb 6.4 oz (81.4 kg)  Height: $Remove'5\' 4"'JfOBdJd$  (1.626 m)   98% on RA BMI Readings from Last 3 Encounters:  07/02/20 30.79 kg/m  06/23/20 31.05 kg/m  06/02/20 31.07 kg/m   Wt Readings from Last 3 Encounters:  07/02/20 179 lb 6.4 oz (81.4 kg)  06/23/20 180 lb 14.4 oz (82.1 kg)  06/02/20 181 lb (82.1 kg)    CBC    Component Value Date/Time   WBC 4.2 06/23/2020 0928   RBC 3.33 (L) 06/23/2020 0928   HGB 11.0 (L) 06/23/2020 0928   HGB 13.1 08/01/2013 1047   HCT 32.4 (L) 06/23/2020 0928   PLT 329 06/23/2020 0928   MCV 97.3 06/23/2020 0928   MCV 93.5 04/27/2012 0841   MCH 33.0 06/23/2020 0928   MCHC 34.0 06/23/2020 0928   RDW 16.4 (H) 06/23/2020 0928   LYMPHSABS 0.8 06/23/2020 0928   MONOABS 0.5 06/23/2020 0928   EOSABS 0.1 06/23/2020 0928   BASOSABS 0.0 06/23/2020 0928     Chest Imaging:  Chest x-ray: 01/02/2020: Status post thoracentesis in the office with no evidence of pneumothorax. The patient's images have been independently reviewed by me.    CT chest 01/02/2020: CT chest completed immediately following thoracentesis in the office reveals studded pleural nodularity concerning for malignancy. The patient's images have been independently reviewed by me.    02/17/2020 chest x-ray:  Minimal left-sided effusion.  The patient's images have been independently reviewed by me.    Pulmonary Functions Testing Results: No flowsheet data found.  FeNO:   Pathology:   Pleural fluid from 12/20/2019: CYTOLOGY - NON PAP  CASE: [id number]  PATIENT: Bita Marcinek  Non-Gynecological Cytology Report   Clinical History:  Specimen Submitted: A. PLEURAL FLUID, LEFT, THORACENTESIS:   FINAL MICROSCOPIC DIAGNOSIS:  - Suspicious for malignancy  - See comment.   SPECIMEN ADEQUACY:  Satisfactory for evaluation   DIAGNOSTIC COMMENTS:  There are small aggregates of cells with slight nuclear enlargement and  irregularity and some of the cells have cytoplasmic vacuoles.  Immunohistochemistry shows these atypical clusters are positive with  cytokeratin 7, MOC31 and patchy positive with CDX2. They are negative  calretinin, cytokeratin 20, D2-40 TTF-1. The findings are suspicious  for adenocarcinoma and the immunophenotype suggest an upper  gastrointestinal primary needs to be ruled out.   Echocardiogram:   Heart Catheterization:      Assessment & Plan:     ICD-10-CM   1. Adenocarcinoma of left lung, stage 4 (HCC)  C34.92   2. Goals of care, counseling/discussion  Z71.89   3. Malignant pleural effusion  J91.0   4. Former smoker  Z87.891   5. SOB (shortness of breath)  R06.02   6. Fatigue, unspecified type  R53.83     Discussion:  This is a 75 year old, former smoker, 15-pack-year history, diagnosed with malignant pleural effusion, stage IV adenocarcinoma of the lung.  Patient currently undergoing chemotherapy and immunotherapy.  Plan: Bedside ultrasound today in the office with no significant pleural fluid accumulation on the left. We had a long discussion today in the office about her current treatment and how she has been dealing and coping with this. She does seem very depressed today about everything that is happening. She is questioning whether or not she  should continue treatments.  I encouraged her to sit and have a frank conversation with Dr. Julien Nordmann about how she feels.  She states that she will plan to do this. Otherwise patient can follow-up with [place] in 6 months or as needed.  If patient has worsening shortness of breath or has imaging was showing recurrence of pleural fluid she states she would let us know so we can arrange for drainage of fluid if needed in the future.   Current Outpatient Medications:  .  ALPRAZolam (XANAX) 0.25 MG tablet, Take 0.25 mg by mouth daily as needed for anxiety., Disp: , Rfl:  .  Ascorbic Acid (VITAMIN C PO), Take 1 tablet by mouth daily., Disp: , Rfl:  .  aspirin 81 MG tablet, Take 81 mg by mouth daily., Disp: , Rfl:  .  b complex vitamins capsule, Take 1 capsule by mouth 2 (two) times a week., Disp: , Rfl:  .  calcium carbonate (TUMS - DOSED IN MG ELEMENTAL CALCIUM) 500 MG chewable tablet, Chew 500-1,000 mg by mouth daily as needed for indigestion or heartburn., Disp: , Rfl:  .  folic acid (FOLVITE) 1 MG tablet, Take 1 tablet (1 mg total) by mouth daily., Disp: 30 tablet, Rfl: 4 .  hydrochlorothiazide (HYDRODIURIL) 25 MG tablet, Take 25 mg by mouth daily., Disp: , Rfl:  .  melatonin 3 MG TABS tablet, at bedtime as needed., Disp: , Rfl:  .  metoprolol succinate (TOPROL-XL) 50 MG 24 hr tablet, Take 50 mg by mouth daily. Take with or immediately following a meal., Disp: , Rfl:  .  Multiple Minerals-Vitamins (CALCIUM-MAGNESIUM-ZINC-D3 PO), Take 1 tablet by  mouth 2 (two) times a week., Disp: , Rfl:  .  Multiple Vitamin (MULTIVITAMIN) tablet, Take 1 tablet by mouth 3 (three) times a week., Disp: , Rfl:  .  oxyCODONE-acetaminophen (PERCOCET/ROXICET) 5-325 MG tablet, TAKE 1 TABLET BY MOUTH EVERY 6 HOURS AS NEEDED FOR SEVERE PAIN., Disp: 30 tablet, Rfl: 0 .  Polyvinyl Alcohol-Povidone (REFRESH OP), Place 1 drop into both eyes daily as needed (dry eyes)., Disp: , Rfl:  .  prochlorperazine (COMPAZINE) 10 MG tablet, TAKE  1 TABLET(10 MG) BY MOUTH EVERY 6 HOURS AS NEEDED FOR NAUSEA OR VOMITING, Disp: 30 tablet, Rfl: 0 .  simvastatin (ZOCOR) 20 MG tablet, Take 20 mg by mouth daily., Disp: , Rfl:  .  VITAMIN D PO, Take 1 capsule by mouth 3 (three) times a week., Disp: , Rfl:  .  VITAMIN E PO, Take 1 capsule by mouth 2 (two) times a week., Disp: , Rfl:    Garner Nash, DO [facility] 07/02/2020 12:18 PM

## 2020-07-10 ENCOUNTER — Telehealth: Payer: Self-pay | Admitting: Medical Oncology

## 2020-07-10 ENCOUNTER — Other Ambulatory Visit: Payer: Self-pay | Admitting: Medical Oncology

## 2020-07-10 DIAGNOSIS — Z888 Allergy status to other drugs, medicaments and biological substances status: Secondary | ICD-10-CM

## 2020-07-10 MED ORDER — DIPHENHYDRAMINE HCL 50 MG PO TABS: 50.0000 mg | ORAL_TABLET | Freq: Every evening | ORAL | 0 refills | Status: DC | PRN

## 2020-07-10 MED ORDER — PREDNISONE 50 MG PO TABS: ORAL_TABLET | ORAL | 3 refills | Status: DC

## 2020-07-10 NOTE — Telephone Encounter (Signed)
Pt notified that Prednisone and benadryl were called to her pharmacy to take as premed for iodine allergy. She said I don't have an allergy to iodine and I have never premedicated before a scan with these meds.I tolerated the scans well.

## 2020-07-13 ENCOUNTER — Other Ambulatory Visit: Payer: Self-pay

## 2020-07-13 ENCOUNTER — Encounter (HOSPITAL_COMMUNITY): Payer: Self-pay

## 2020-07-13 ENCOUNTER — Ambulatory Visit (HOSPITAL_COMMUNITY)
Admission: RE | Admit: 2020-07-13 | Discharge: 2020-07-13 | Disposition: A | Discharge status: Home / Self Care | Payer: Medicare PPO | Source: Ambulatory Visit | Attending: Internal Medicine | Admitting: Internal Medicine

## 2020-07-13 DIAGNOSIS — C3490 Malignant neoplasm of unspecified part of unspecified bronchus or lung: Secondary | ICD-10-CM | POA: Insufficient documentation

## 2020-07-13 DIAGNOSIS — I700 Atherosclerosis of aorta: Secondary | ICD-10-CM | POA: Diagnosis not present

## 2020-07-13 DIAGNOSIS — I2510 Atherosclerotic heart disease of native coronary artery without angina pectoris: Secondary | ICD-10-CM | POA: Diagnosis not present

## 2020-07-13 DIAGNOSIS — J929 Pleural plaque without asbestos: Secondary | ICD-10-CM | POA: Diagnosis not present

## 2020-07-13 DIAGNOSIS — C7951 Secondary malignant neoplasm of bone: Secondary | ICD-10-CM | POA: Diagnosis not present

## 2020-07-13 MED ORDER — SODIUM CHLORIDE (PF) 0.9 % IJ SOLN
INTRAMUSCULAR | Status: AC
  Filled 2020-07-13: qty 50

## 2020-07-13 MED ORDER — IOHEXOL 300 MG/ML  SOLN
75.0000 mL | Freq: Once | INTRAMUSCULAR | Status: AC | PRN
  Administered 2020-07-13: 75 mL via INTRAVENOUS

## 2020-07-14 ENCOUNTER — Inpatient Hospital Stay: Payer: Medicare PPO

## 2020-07-14 ENCOUNTER — Encounter: Payer: Self-pay | Admitting: Internal Medicine

## 2020-07-14 ENCOUNTER — Inpatient Hospital Stay: Payer: Medicare PPO | Attending: Internal Medicine | Admitting: Internal Medicine

## 2020-07-14 ENCOUNTER — Other Ambulatory Visit: Payer: Self-pay | Admitting: Physician Assistant

## 2020-07-14 ENCOUNTER — Inpatient Hospital Stay: Payer: Medicare PPO | Admitting: Nutrition

## 2020-07-14 ENCOUNTER — Encounter: Payer: Self-pay | Admitting: *Deleted

## 2020-07-14 ENCOUNTER — Other Ambulatory Visit (HOSPITAL_COMMUNITY): Payer: Self-pay

## 2020-07-14 VITALS — BP 142/70 | HR 64 | Temp 97.4°F | Resp 15 | Ht 64.0 in | Wt 182.1 lb

## 2020-07-14 DIAGNOSIS — Z5111 Encounter for antineoplastic chemotherapy: Secondary | ICD-10-CM | POA: Insufficient documentation

## 2020-07-14 DIAGNOSIS — E039 Hypothyroidism, unspecified: Secondary | ICD-10-CM

## 2020-07-14 DIAGNOSIS — Z79899 Other long term (current) drug therapy: Secondary | ICD-10-CM | POA: Insufficient documentation

## 2020-07-14 DIAGNOSIS — C3492 Malignant neoplasm of unspecified part of left bronchus or lung: Secondary | ICD-10-CM | POA: Diagnosis not present

## 2020-07-14 DIAGNOSIS — Z5112 Encounter for antineoplastic immunotherapy: Secondary | ICD-10-CM | POA: Insufficient documentation

## 2020-07-14 DIAGNOSIS — C3491 Malignant neoplasm of unspecified part of right bronchus or lung: Secondary | ICD-10-CM | POA: Insufficient documentation

## 2020-07-14 LAB — CBC WITH DIFFERENTIAL (CANCER CENTER ONLY)
Abs Immature Granulocytes: 0.01 10*3/uL (ref 0.00–0.07)
Basophils Absolute: 0 10*3/uL (ref 0.0–0.1)
Basophils Relative: 1 %
Eosinophils Absolute: 0.1 10*3/uL (ref 0.0–0.5)
Eosinophils Relative: 1 %
HCT: 31.8 % — ABNORMAL LOW (ref 36.0–46.0)
Hemoglobin: 11.1 g/dL — ABNORMAL LOW (ref 12.0–15.0)
Immature Granulocytes: 0 %
Lymphocytes Relative: 16 %
Lymphs Abs: 0.8 10*3/uL (ref 0.7–4.0)
MCH: 33.8 pg (ref 26.0–34.0)
MCHC: 34.9 g/dL (ref 30.0–36.0)
MCV: 97 fL (ref 80.0–100.0)
Monocytes Absolute: 0.6 10*3/uL (ref 0.1–1.0)
Monocytes Relative: 12 %
Neutro Abs: 3.5 10*3/uL (ref 1.7–7.7)
Neutrophils Relative %: 70 %
Platelet Count: 299 10*3/uL (ref 150–400)
RBC: 3.28 MIL/uL — ABNORMAL LOW (ref 3.87–5.11)
RDW: 14.5 % (ref 11.5–15.5)
WBC Count: 5 10*3/uL (ref 4.0–10.5)
nRBC: 0 % (ref 0.0–0.2)

## 2020-07-14 LAB — CMP (CANCER CENTER ONLY)
ALT: 25 U/L (ref 0–44)
AST: 34 U/L (ref 15–41)
Albumin: 3.5 g/dL (ref 3.5–5.0)
Alkaline Phosphatase: 70 U/L (ref 38–126)
Anion gap: 9 (ref 5–15)
BUN: 9 mg/dL (ref 8–23)
CO2: 28 mmol/L (ref 22–32)
Calcium: 9.1 mg/dL (ref 8.9–10.3)
Chloride: 101 mmol/L (ref 98–111)
Creatinine: 0.67 mg/dL (ref 0.44–1.00)
GFR, Estimated: >60 mL/min (ref >60)
Glucose, Bld: 102 mg/dL — ABNORMAL HIGH (ref 70–99)
Potassium: 3.5 mmol/L (ref 3.5–5.1)
Sodium: 138 mmol/L (ref 135–145)
Total Bilirubin: 0.4 mg/dL (ref 0.3–1.2)
Total Protein: 7 g/dL (ref 6.5–8.1)

## 2020-07-14 LAB — TSH: TSH: 16.632 u[IU]/mL — ABNORMAL HIGH (ref 0.308–3.960)

## 2020-07-14 MED ORDER — LEVOTHYROXINE SODIUM 50 MCG PO TABS
50.0000 ug | ORAL_TABLET | Freq: Every day | ORAL | 1 refills | Status: DC
  Filled 2020-07-14: qty 30, 30d supply, fill #0

## 2020-07-14 MED ORDER — SODIUM CHLORIDE 0.9 % IV SOLN
200.0000 mg | Freq: Once | INTRAVENOUS | Status: AC
  Administered 2020-07-14: 200 mg via INTRAVENOUS
  Filled 2020-07-14: qty 8

## 2020-07-14 MED ORDER — SODIUM CHLORIDE 0.9 % IV SOLN
Freq: Once | INTRAVENOUS | Status: AC
  Filled 2020-07-14: qty 250

## 2020-07-14 MED ORDER — PROCHLORPERAZINE MALEATE 10 MG PO TABS
10.0000 mg | ORAL_TABLET | Freq: Once | ORAL | Status: AC
  Administered 2020-07-14: 10 mg via ORAL

## 2020-07-14 MED ORDER — SODIUM CHLORIDE 0.9 % IV SOLN
500.0000 mg/m2 | Freq: Once | INTRAVENOUS | Status: AC
  Administered 2020-07-14: 1000 mg via INTRAVENOUS
  Filled 2020-07-14: qty 40

## 2020-07-14 MED ORDER — PROCHLORPERAZINE MALEATE 10 MG PO TABS
ORAL_TABLET | ORAL | Status: AC
  Filled 2020-07-14: qty 1

## 2020-07-14 MED ORDER — LIDOCAINE-PRILOCAINE 2.5-2.5 % EX CREA: TOPICAL_CREAM | CUTANEOUS | 0 refills | Status: DC

## 2020-07-14 NOTE — Patient Instructions (Signed)
[facility] ONCOLOGY  Discharge Instructions: Thank you for choosing [facility] to provide your oncology and hematology care.   If you have a lab appointment with the [facility], please go directly to the [facility] and check in at the registration area.   Wear comfortable clothing and clothing appropriate for easy access to any Portacath or PICC line.   We strive to give you quality time with your provider. You may need to reschedule your appointment if you arrive late (15 or more minutes).  Arriving late affects you and other patients whose appointments are after yours.  Also, if you miss three or more appointments without notifying the office, you may be dismissed from the clinic at the provider's discretion.      For prescription refill requests, have your pharmacy contact our office and allow 72 hours for refills to be completed.    Today you received the following chemotherapy and/or immunotherapy agents Keytruda, alimta    To help prevent nausea and vomiting after your treatment, we encourage you to take your nausea medication as directed.  BELOW ARE SYMPTOMS THAT SHOULD BE REPORTED IMMEDIATELY: . *FEVER GREATER THAN 100.4 F (38 C) OR HIGHER . *CHILLS OR SWEATING . *NAUSEA AND VOMITING THAT IS NOT CONTROLLED WITH YOUR NAUSEA MEDICATION . *UNUSUAL SHORTNESS OF BREATH . *UNUSUAL BRUISING OR BLEEDING . *URINARY PROBLEMS (pain or burning when urinating, or frequent urination) . *BOWEL PROBLEMS (unusual diarrhea, constipation, pain near the anus) . TENDERNESS IN MOUTH AND THROAT WITH OR WITHOUT PRESENCE OF ULCERS (sore throat, sores in mouth, or a toothache) . UNUSUAL RASH, SWELLING OR PAIN  . UNUSUAL VAGINAL DISCHARGE OR ITCHING   Items with * indicate a potential emergency and should be followed up as soon as possible or go to the Emergency Department if any problems should occur.  Please show the CHEMOTHERAPY ALERT CARD or IMMUNOTHERAPY  ALERT CARD at check-in to the Emergency Department and triage nurse.  Should you have questions after your visit or need to cancel or reschedule your appointment, please contact [facility]  Dept: [phone]  and follow the prompts.  Office hours are 8:00 a.m. to 4:30 p.m. Monday - Friday. Please note that voicemails left after 4:00 p.m. may not be returned until the following business day.  We are closed weekends and major holidays. You have access to a nurse at all times for urgent questions. Please call the main number to the clinic Dept: [phone] and follow the prompts.   For any non-urgent questions, you may also contact your provider using MyChart. We now offer e-Visits for anyone 85 and older to request care online for non-urgent symptoms. For details visit [email or web address].   Also download the MyChart app! Go to the app store, search "MyChart", open the app, select , and log in with your MyChart username and password.  Due to Covid, a mask is required upon entering the hospital/clinic. If you do not have a mask, one will be given to you upon arrival. For doctor visits, patients may have 1 support person aged 43 or older with them. For treatment visits, patients cannot have anyone with them due to current Covid guidelines and our immunocompromised population.

## 2020-07-14 NOTE — Progress Notes (Signed)
[facility] [phone]   Fax:([phone]  OFFICE PROGRESS NOTE  [name], MD [address] [place] [address]  DIAGNOSIS: stage IV (T1b, N1, M1 a) non-small cell lung cancer favoring adenocarcinoma diagnosed in October 2021.  The pathologic immunohistochemical stains suggestive of upper GI primary but there is no finding on the PET scan or imaging study to support this possibility.  Biomarker Findings Microsatellite status - MS-Stable Tumor Mutational Burden - 3 Muts/Mb Genomic Findings For a complete list of the genes assayed, please refer to the Appendix. KRAS G12C STK11 E145* 7 Disease relevant genes with no reportable alterations: ALK, BRAF, EGFR, ERBB2, MET, RET, ROS1  PDL1 Expression 0%  PRIOR THERAPY: None  CURRENT THERAPY: Systemic chemotherapy with carboplatin for AUC of 5, Alimta 500 mg/M2 and Keytruda 200 mg IV every 3 weeks.  First dose March 11, 2019.  Status post 6 cycles.  Starting from cycle #5 the patient will be on maintenance treatment with Alimta and Keytruda every 3 weeks.  INTERVAL HISTORY: Cindy [name] 75 y.o. female returns to the clinic today for follow-up visit.  The patient is feeling fine today with no concerning complaints except for mild swelling of the eyes.  She denied having any current chest pain, shortness of breath, cough or hemoptysis.  She denied having any fever or chills.  She has no nausea, vomiting, diarrhea or constipation.  She denied having any headache or visual changes.  She continues to tolerate her maintenance treatment with Alimta and Keytruda fairly well.  The patient had repeat CT scan of the chest, abdomen pelvis performed recently and she is here for evaluation and discussion of her risk her results.   MEDICAL HISTORY: Past Medical History:  Diagnosis Date  . Anxiety   . Depression   . Hyperlipidemia   . Hypertension     ALLERGIES:  is allergic to  escitalopram and procaine.  MEDICATIONS:  Current Outpatient Medications  Medication Sig Dispense Refill  . ALPRAZolam (XANAX) 0.25 MG tablet Take 0.25 mg by mouth daily as needed for anxiety.    . Ascorbic Acid (VITAMIN C PO) Take 1 tablet by mouth daily.    Marland Kitchen aspirin 81 MG tablet Take 81 mg by mouth daily.    Marland Kitchen b complex vitamins capsule Take 1 capsule by mouth 2 (two) times a week.    . calcium carbonate (TUMS - DOSED IN MG ELEMENTAL CALCIUM) 500 MG chewable tablet Chew 500-1,000 mg by mouth daily as needed for indigestion or heartburn.    . diphenhydrAMINE (BENADRYL) 50 MG tablet Take 1 tablet (50 mg total) by mouth at bedtime as needed for itching. Take one tablet one hour prior to CT scan 30 tablet 0  . folic acid (FOLVITE) 1 MG tablet Take 1 tablet (1 mg total) by mouth daily. 30 tablet 4  . hydrochlorothiazide (HYDRODIURIL) 25 MG tablet Take 25 mg by mouth daily.    . melatonin 3 MG TABS tablet at bedtime as needed.    . metoprolol succinate (TOPROL-XL) 50 MG 24 hr tablet Take 50 mg by mouth daily. Take with or immediately following a meal.    . Multiple Minerals-Vitamins (CALCIUM-MAGNESIUM-ZINC-D3 PO) Take 1 tablet by mouth 2 (two) times a week.    . Multiple Vitamin (MULTIVITAMIN) tablet Take 1 tablet by mouth 3 (three) times a week.    Marland Kitchen oxyCODONE-acetaminophen (PERCOCET/ROXICET) 5-325 MG tablet TAKE 1 TABLET BY MOUTH EVERY 6 HOURS AS NEEDED  FOR SEVERE PAIN. 30 tablet 0  . Polyvinyl Alcohol-Povidone (REFRESH OP) Place 1 drop into both eyes daily as needed (dry eyes).    . predniSONE (DELTASONE) 50 MG tablet Take one tablet 13 hours , one table seven hours and one tablet 1 hour prior to CT scan. 3 tablet 3  . prochlorperazine (COMPAZINE) 10 MG tablet TAKE 1 TABLET(10 MG) BY MOUTH EVERY 6 HOURS AS NEEDED FOR NAUSEA OR VOMITING 30 tablet 0  . simvastatin (ZOCOR) 20 MG tablet Take 20 mg by mouth daily.    Marland Kitchen VITAMIN D PO Take 1 capsule by mouth 3 (three) times a week.    Marland Kitchen VITAMIN E PO  Take 1 capsule by mouth 2 (two) times a week.     No current facility-administered medications for this visit.    SURGICAL HISTORY:  Past Surgical History:  Procedure Laterality Date  . DENTAL SURGERY  2012 - 2013   implants  . IR THORACENTESIS ASP PLEURAL SPACE W/IMG GUIDE  12/19/2019  . WISDOM TOOTH EXTRACTION  20    REVIEW OF SYSTEMS:  Constitutional: positive for fatigue Eyes: negative Ears, nose, mouth, throat, and face: negative Respiratory: negative Cardiovascular: negative Gastrointestinal: negative Genitourinary:negative Integument/breast: negative Hematologic/lymphatic: negative Musculoskeletal:negative Neurological: negative Behavioral/Psych: negative Endocrine: negative Allergic/Immunologic: negative   PHYSICAL EXAMINATION: General appearance: alert, cooperative, fatigued and no distress Head: Normocephalic, without obvious abnormality, atraumatic Neck: no adenopathy, no JVD, supple, symmetrical, trachea midline and thyroid not enlarged, symmetric, no tenderness/mass/nodules Lymph nodes: Cervical, supraclavicular, and axillary nodes normal. Resp: clear to auscultation bilaterally Back: symmetric, no curvature. ROM normal. No CVA tenderness. Cardio: regular rate and rhythm, S1, S2 normal, no murmur, click, rub or gallop GI: soft, non-tender; bowel sounds normal; no masses,  no organomegaly Extremities: extremities normal, atraumatic, no cyanosis or edema Neurologic: Alert and oriented X 3, normal strength and tone. Normal symmetric reflexes. Normal coordination and gait  ECOG PERFORMANCE STATUS: 1 - Symptomatic but completely ambulatory  Blood pressure (!) 142/70, pulse 64, temperature (!) 97.4 F (36.3 C), temperature source Tympanic, resp. rate 15, height $RemoveBe'5\' 4"'eJIVnrHik$  (1.626 m), weight 182 lb 1.6 oz (82.6 kg), last menstrual period 09/29/2002, SpO2 98 %.  LABORATORY DATA: Lab Results  Component Value Date   WBC 5.0 07/14/2020   HGB 11.1 (L) 07/14/2020   HCT  31.8 (L) 07/14/2020   MCV 97.0 07/14/2020   PLT 299 07/14/2020      Chemistry      Component Value Date/Time   NA 140 06/23/2020 0928   K 3.3 (L) 06/23/2020 0928   CL 103 06/23/2020 0928   CO2 27 06/23/2020 0928   BUN 11 06/23/2020 0928   CREATININE 0.70 06/23/2020 0928      Component Value Date/Time   CALCIUM 9.2 06/23/2020 0928   ALKPHOS 71 06/23/2020 0928   AST 29 06/23/2020 0928   ALT 25 06/23/2020 0928   BILITOT 0.5 06/23/2020 0928       RADIOGRAPHIC STUDIES: CT Chest W Contrast  Result Date: 07/14/2020 CLINICAL DATA:  Non-small cell lung cancer restaging, ongoing chemotherapy and immunotherapy EXAM: CT CHEST, ABDOMEN, AND PELVIS WITH CONTRAST TECHNIQUE: Multidetector CT imaging of the chest, abdomen and pelvis was performed following the standard protocol during bolus administration of intravenous contrast. CONTRAST:  94mL OMNIPAQUE IOHEXOL 300 MG/ML SOLN, additional oral enteric contrast COMPARISON:  05/11/2020 FINDINGS: CT CHEST FINDINGS Cardiovascular: Aortic atherosclerosis. Normal heart size. Left coronary artery calcifications. Unchanged, trace pericardial effusion and pericardial thickening. Mediastinum/Nodes: Unchanged prominent left infrahilar lymph  node, measuring 1.2 x 1.1 cm (series 2, image 32). Unchanged prominent high right paratracheal lymph node measuring 1.1 x 1.0 cm (series 2, image 18). No other enlarged mediastinal lymph nodes. Thyroid gland, trachea, and esophagus demonstrate no significant findings. Lungs/Pleura: Unchanged, small, loculated left pleural effusion with diffuse pleural thickening and nodularity. Extensive septal thickening and consolidation of the left lower lobe and lingula. Unchanged discrete pleural nodules, for example of the anterior left upper lobe measuring 1.4 x 0.8 cm (series 6, image 54). Unchanged nodule or conglomerate of the superior segment left lower lobe, measuring 3.0 x 3.0 cm (series 6, image 69). Musculoskeletal: No chest wall  mass. CT ABDOMEN PELVIS FINDINGS Hepatobiliary: No solid liver abnormality is seen. No gallstones, gallbladder wall thickening, or biliary dilatation. Pancreas: Unremarkable. No pancreatic ductal dilatation or surrounding inflammatory changes. Spleen: Normal in size without significant abnormality. Adrenals/Urinary Tract: Adrenal glands are unremarkable. Kidneys are normal, without renal calculi, solid lesion, or hydronephrosis. Bladder is unremarkable. Stomach/Bowel: Stomach is within normal limits. Appendix appears normal. No evidence of bowel wall thickening, distention, or inflammatory changes. Sigmoid diverticula. Vascular/Lymphatic: Aortic atherosclerosis. No enlarged abdominal or pelvic lymph nodes. Reproductive: No mass or other abnormality. Other: No abdominal wall hernia or abnormality. No abdominopelvic ascites. Musculoskeletal: Multiple sclerotic osseous lesions throughout the skeleton, unchanged, including of the manubrium, sternal body, T1, T2, T3, L1, L2, L3, and S1 vertebral bodies (series 5, image 96). IMPRESSION: 1. Unchanged, small, loculated left pleural effusion with diffuse pleural thickening and nodularity as well as extensive septal thickening and consolidation of the left lower lobe and lingula, consistent with pleural and lymphangitic metastatic disease. 2. Unchanged discrete pleural nodules and unchanged nodule or conglomerate of the superior segment left lower lobe. 3. Unchanged prominent left infrahilar and high right paratracheal lymph nodes. 4. Multiple sclerotic osseous metastatic lesions throughout the skeleton, unchanged. 5. No evidence of new metastatic disease in the chest, abdomen, or pelvis. 6. Coronary artery disease. Aortic Atherosclerosis (ICD10-I70.0). Electronically Signed   By: Eddie Candle M.D.   On: 07/14/2020 09:38   CT Abdomen Pelvis W Contrast  Result Date: 07/14/2020 CLINICAL DATA:  Non-small cell lung cancer restaging, ongoing chemotherapy and immunotherapy  EXAM: CT CHEST, ABDOMEN, AND PELVIS WITH CONTRAST TECHNIQUE: Multidetector CT imaging of the chest, abdomen and pelvis was performed following the standard protocol during bolus administration of intravenous contrast. CONTRAST:  61mL OMNIPAQUE IOHEXOL 300 MG/ML SOLN, additional oral enteric contrast COMPARISON:  05/11/2020 FINDINGS: CT CHEST FINDINGS Cardiovascular: Aortic atherosclerosis. Normal heart size. Left coronary artery calcifications. Unchanged, trace pericardial effusion and pericardial thickening. Mediastinum/Nodes: Unchanged prominent left infrahilar lymph node, measuring 1.2 x 1.1 cm (series 2, image 32). Unchanged prominent high right paratracheal lymph node measuring 1.1 x 1.0 cm (series 2, image 18). No other enlarged mediastinal lymph nodes. Thyroid gland, trachea, and esophagus demonstrate no significant findings. Lungs/Pleura: Unchanged, small, loculated left pleural effusion with diffuse pleural thickening and nodularity. Extensive septal thickening and consolidation of the left lower lobe and lingula. Unchanged discrete pleural nodules, for example of the anterior left upper lobe measuring 1.4 x 0.8 cm (series 6, image 54). Unchanged nodule or conglomerate of the superior segment left lower lobe, measuring 3.0 x 3.0 cm (series 6, image 69). Musculoskeletal: No chest wall mass. CT ABDOMEN PELVIS FINDINGS Hepatobiliary: No solid liver abnormality is seen. No gallstones, gallbladder wall thickening, or biliary dilatation. Pancreas: Unremarkable. No pancreatic ductal dilatation or surrounding inflammatory changes. Spleen: Normal in size without significant abnormality. Adrenals/Urinary Tract: Adrenal glands  are unremarkable. Kidneys are normal, without renal calculi, solid lesion, or hydronephrosis. Bladder is unremarkable. Stomach/Bowel: Stomach is within normal limits. Appendix appears normal. No evidence of bowel wall thickening, distention, or inflammatory changes. Sigmoid diverticula.  Vascular/Lymphatic: Aortic atherosclerosis. No enlarged abdominal or pelvic lymph nodes. Reproductive: No mass or other abnormality. Other: No abdominal wall hernia or abnormality. No abdominopelvic ascites. Musculoskeletal: Multiple sclerotic osseous lesions throughout the skeleton, unchanged, including of the manubrium, sternal body, T1, T2, T3, L1, L2, L3, and S1 vertebral bodies (series 5, image 96). IMPRESSION: 1. Unchanged, small, loculated left pleural effusion with diffuse pleural thickening and nodularity as well as extensive septal thickening and consolidation of the left lower lobe and lingula, consistent with pleural and lymphangitic metastatic disease. 2. Unchanged discrete pleural nodules and unchanged nodule or conglomerate of the superior segment left lower lobe. 3. Unchanged prominent left infrahilar and high right paratracheal lymph nodes. 4. Multiple sclerotic osseous metastatic lesions throughout the skeleton, unchanged. 5. No evidence of new metastatic disease in the chest, abdomen, or pelvis. 6. Coronary artery disease. Aortic Atherosclerosis (ICD10-I70.0). Electronically Signed   By: Eddie Candle M.D.   On: 07/14/2020 09:38    ASSESSMENT AND PLAN: This is a very pleasant 74 years old white female recently diagnosed with a stage IV (T1b, N2, M1 a) non-small cell lung cancer, adenocarcinoma with no actionable mutations based on the molecular studies by [facility]. The patient underwent CT-guided core biopsy of one of the left lung pleural-based nodules by interventional radiology and the final pathology was consistent with metastatic adenocarcinoma. Her tissue block [facility] for molecular studies and PD-L1 expression.  The molecular studies by [facility] showed positive K-ras G12C mutation which will be an option for targeted therapy in the second line setting with Lumakras (Sotorasib). The patient is currently undergoing palliative systemic chemotherapy with  carboplatin for AUC of 5, Alimta 500 mg/M2 and Keytruda 200 mg IV every 3 weeks.  Status post 6 cycles.  Starting from cycle #5 the patient will be on maintenance treatment with Alimta and Keytruda every 3 weeks. The patient has been tolerating this treatment fairly well with no concerning adverse effects. She had repeat CT scan of the chest, abdomen pelvis performed recently.  I personally and independently reviewed the scan images and discussed the results with the patient today. Her scan showed no concerning findings for disease progression. I recommended for her to continue her current treatment with maintenance Alimta and Keytruda and she will proceed with cycle #7 today. For the history of depression, the patient will resume her treatment with Prozac and I will refer her to psychiatry for evaluation based on her request. For IV access, I will arrange for the patient to have Port-A-Cath placement. For pain management she will continue on Percocet on as-needed basis. For the malnutrition, I will refer the patient to the dietitian at the cancer center for evaluation.  The patient voices understanding of current disease status and treatment options and is in agreement with the current care plan.  All questions were answered. The patient knows to call the clinic with any problems, questions or concerns. We can certainly see the patient much sooner if necessary.  Disclaimer: This note was dictated with voice recognition software. Similar sounding words can inadvertently be transcribed and may not be corrected upon review.

## 2020-07-14 NOTE — Progress Notes (Signed)
I spoke with Cindy [name] today.  She would like to go to the advance directive clinic. I reached out to CSW for assistance.

## 2020-07-14 NOTE — Progress Notes (Signed)
I called the patient to let her know I was going to start her on 50 mcg of synthroid. We will monitor her labs every 3 weeks. She would like this sent to [facility].

## 2020-07-14 NOTE — Progress Notes (Signed)
75 year old female diagnosed with lung cancer receiving Keytruda and Alimta and followed by Dr. Julien Nordmann.  Past medical history includes hypertension, hyperlipidemia, depression, anxiety.  Medications include Xanax, vitamin C, vitamin B, Folvite, multivitamin, Compazine, vitamin E.  Labs were reviewed.  Height: 5 feet 4 inches. Weight: 182.1 pounds. Usual body weight: 190 pounds. BMI: 31.26.  Patient is very interested in nutrition and states she tries to eat healthy.  Her appetite and taste for food has changed since beginning treatment. She denies nausea, vomiting, and diarrhea.  She tends to lean more towards constipation but is controlling this with MiraLAX. Reports she has noted some increased fluid retention over the last few days and states MD advised her to decrease total sodium consumption. Patient will drink boost or make a shake for extra protein.  Nutrition diagnosis: Unintended weight loss related to lung cancer and associated treatments as evidenced by 4% weight loss from usual body weight.  Intervention: Educated patient to consume smaller more frequent meals and snacks utilizing a high-protein diet and adequate calories for weight maintenance. Reviewed low-sodium diet.  Discouraged increased fast foods or restaurant foods and using saltshaker at the table. Continue boost as needed.  I provided coupons for oral nutrition supplements. Provided nutrition fact sheet for high-protein foods. Questions were answered.  Teach back method used.  Contact information given.  Monitoring, evaluation, goals: Patient will tolerate adequate calories and protein for weight maintenance.  Next visit: Tuesday, June 7 during infusion.  **Disclaimer: This note was dictated with voice recognition software. Similar sounding words can inadvertently be transcribed and this note may contain transcription errors which may not have been corrected upon publication of note.**

## 2020-07-21 ENCOUNTER — Encounter: Payer: Self-pay | Admitting: Medical Oncology

## 2020-07-21 ENCOUNTER — Telehealth: Payer: Self-pay | Admitting: Medical Oncology

## 2020-07-21 NOTE — Telephone Encounter (Signed)
rash appeared about 3 hours ago after lunch.   She describes rash  As  flat,  red/hot and itchy  on her neck , head, arms and inside her ears.   She denies dyspnea, chest tightness, hives,swelling, itchy eyes.  She reports "chest congestion ". She drank a beer at lunch.  I recommended that she go to urgent care. She said she will take a  benadryl and if symptoms worsen she will go to ED or call 911.

## 2020-07-23 ENCOUNTER — Other Ambulatory Visit: Payer: Self-pay | Admitting: Internal Medicine

## 2020-07-24 ENCOUNTER — Other Ambulatory Visit: Payer: Self-pay | Admitting: Physician Assistant

## 2020-07-24 ENCOUNTER — Other Ambulatory Visit: Payer: Self-pay | Admitting: Radiology

## 2020-07-24 ENCOUNTER — Other Ambulatory Visit (HOSPITAL_COMMUNITY): Payer: Self-pay

## 2020-07-24 DIAGNOSIS — R059 Cough, unspecified: Secondary | ICD-10-CM

## 2020-07-24 MED ORDER — HYDROCODONE BIT-HOMATROP MBR 5-1.5 MG/5ML PO SOLN
5.0000 mL | Freq: Four times a day (QID) | ORAL | 0 refills | Status: DC | PRN
Start: 2020-07-24 — End: 2022-06-10
  Filled 2020-07-24: qty 120, 6d supply, fill #0

## 2020-07-26 DIAGNOSIS — U071 COVID-19: Secondary | ICD-10-CM | POA: Diagnosis not present

## 2020-07-28 ENCOUNTER — Inpatient Hospital Stay (HOSPITAL_COMMUNITY): Admission: RE | Admit: 2020-07-28 | Payer: Medicare PPO | Source: Ambulatory Visit

## 2020-07-28 ENCOUNTER — Telehealth: Payer: Self-pay | Admitting: Medical Oncology

## 2020-07-28 ENCOUNTER — Ambulatory Visit (HOSPITAL_COMMUNITY): Payer: Medicare PPO

## 2020-07-28 ENCOUNTER — Encounter (HOSPITAL_COMMUNITY): Payer: Self-pay

## 2020-07-28 NOTE — Telephone Encounter (Signed)
Port procedure cancelled because she tested positive for COVID.

## 2020-08-03 ENCOUNTER — Telehealth: Payer: Self-pay | Admitting: Internal Medicine

## 2020-08-03 NOTE — Telephone Encounter (Signed)
Scheduled appointment per 06/06 sch msg. Patient is aware.

## 2020-08-04 ENCOUNTER — Ambulatory Visit: Payer: Medicare PPO | Admitting: Internal Medicine

## 2020-08-04 ENCOUNTER — Other Ambulatory Visit: Payer: Medicare PPO

## 2020-08-04 ENCOUNTER — Ambulatory Visit: Payer: Medicare PPO

## 2020-08-04 ENCOUNTER — Encounter: Payer: Medicare PPO | Admitting: Nutrition

## 2020-08-10 ENCOUNTER — Other Ambulatory Visit: Payer: Self-pay | Admitting: Physician Assistant

## 2020-08-10 ENCOUNTER — Inpatient Hospital Stay: Payer: Medicare PPO

## 2020-08-10 ENCOUNTER — Other Ambulatory Visit: Payer: Medicare PPO | Admitting: Licensed Clinical Social Worker

## 2020-08-10 ENCOUNTER — Inpatient Hospital Stay: Payer: Medicare PPO | Attending: Internal Medicine | Admitting: Internal Medicine

## 2020-08-10 ENCOUNTER — Other Ambulatory Visit: Payer: Self-pay | Admitting: Internal Medicine

## 2020-08-10 ENCOUNTER — Other Ambulatory Visit: Payer: Self-pay

## 2020-08-10 VITALS — BP 153/82 | HR 69 | Temp 98.6°F | Resp 16 | Wt 182.0 lb

## 2020-08-10 DIAGNOSIS — C3491 Malignant neoplasm of unspecified part of right bronchus or lung: Secondary | ICD-10-CM | POA: Insufficient documentation

## 2020-08-10 DIAGNOSIS — Z5112 Encounter for antineoplastic immunotherapy: Secondary | ICD-10-CM | POA: Insufficient documentation

## 2020-08-10 DIAGNOSIS — C3492 Malignant neoplasm of unspecified part of left bronchus or lung: Secondary | ICD-10-CM

## 2020-08-10 DIAGNOSIS — Z5111 Encounter for antineoplastic chemotherapy: Secondary | ICD-10-CM | POA: Insufficient documentation

## 2020-08-10 DIAGNOSIS — Z79899 Other long term (current) drug therapy: Secondary | ICD-10-CM | POA: Diagnosis not present

## 2020-08-10 DIAGNOSIS — E039 Hypothyroidism, unspecified: Secondary | ICD-10-CM

## 2020-08-10 DIAGNOSIS — E876 Hypokalemia: Secondary | ICD-10-CM

## 2020-08-10 LAB — CMP (CANCER CENTER ONLY)
ALT: 26 U/L (ref 0–44)
AST: 37 U/L (ref 15–41)
Albumin: 3.6 g/dL (ref 3.5–5.0)
Alkaline Phosphatase: 71 U/L (ref 38–126)
Anion gap: 9 (ref 5–15)
BUN: 11 mg/dL (ref 8–23)
CO2: 27 mmol/L (ref 22–32)
Calcium: 9.2 mg/dL (ref 8.9–10.3)
Chloride: 100 mmol/L (ref 98–111)
Creatinine: 0.7 mg/dL (ref 0.44–1.00)
GFR, Estimated: >60 mL/min (ref >60)
Glucose, Bld: 114 mg/dL — ABNORMAL HIGH (ref 70–99)
Potassium: 3.1 mmol/L — ABNORMAL LOW (ref 3.5–5.1)
Sodium: 136 mmol/L (ref 135–145)
Total Bilirubin: 0.3 mg/dL (ref 0.3–1.2)
Total Protein: 7.2 g/dL (ref 6.5–8.1)

## 2020-08-10 LAB — CBC WITH DIFFERENTIAL (CANCER CENTER ONLY)
Abs Immature Granulocytes: 0.02 10*3/uL (ref 0.00–0.07)
Basophils Absolute: 0 10*3/uL (ref 0.0–0.1)
Basophils Relative: 1 %
Eosinophils Absolute: 0.1 10*3/uL (ref 0.0–0.5)
Eosinophils Relative: 1 %
HCT: 32.3 % — ABNORMAL LOW (ref 36.0–46.0)
Hemoglobin: 11.3 g/dL — ABNORMAL LOW (ref 12.0–15.0)
Immature Granulocytes: 0 %
Lymphocytes Relative: 19 %
Lymphs Abs: 1 10*3/uL (ref 0.7–4.0)
MCH: 33.5 pg (ref 26.0–34.0)
MCHC: 35 g/dL (ref 30.0–36.0)
MCV: 95.8 fL (ref 80.0–100.0)
Monocytes Absolute: 0.5 10*3/uL (ref 0.1–1.0)
Monocytes Relative: 10 %
Neutro Abs: 3.7 10*3/uL (ref 1.7–7.7)
Neutrophils Relative %: 69 %
Platelet Count: 197 10*3/uL (ref 150–400)
RBC: 3.37 MIL/uL — ABNORMAL LOW (ref 3.87–5.11)
RDW: 12.8 % (ref 11.5–15.5)
WBC Count: 5.4 10*3/uL (ref 4.0–10.5)
nRBC: 0 % (ref 0.0–0.2)

## 2020-08-10 LAB — TSH: TSH: 12.067 u[IU]/mL — ABNORMAL HIGH (ref 0.308–3.960)

## 2020-08-10 MED ORDER — PROCHLORPERAZINE MALEATE 10 MG PO TABS
ORAL_TABLET | ORAL | Status: AC
  Filled 2020-08-10: qty 1

## 2020-08-10 MED ORDER — POTASSIUM CHLORIDE CRYS ER 20 MEQ PO TBCR: 20.0000 meq | EXTENDED_RELEASE_TABLET | Freq: Every day | ORAL | 0 refills | Status: DC

## 2020-08-10 MED ORDER — SODIUM CHLORIDE 0.9 % IV SOLN
500.0000 mg/m2 | Freq: Once | INTRAVENOUS | Status: AC
  Administered 2020-08-10: 1000 mg via INTRAVENOUS
  Filled 2020-08-10: qty 40

## 2020-08-10 MED ORDER — LEVOTHYROXINE SODIUM 50 MCG PO TABS
50.0000 ug | ORAL_TABLET | Freq: Every day | ORAL | 1 refills | Status: DC
  Filled 2020-08-17 (×2): qty 30, 30d supply, fill #0
  Filled 2020-09-08: qty 30, 30d supply, fill #1

## 2020-08-10 MED ORDER — PROCHLORPERAZINE MALEATE 10 MG PO TABS
10.0000 mg | ORAL_TABLET | Freq: Once | ORAL | Status: AC
Start: 2020-08-10 — End: 2020-08-10
  Administered 2020-08-10: 10 mg via ORAL

## 2020-08-10 MED ORDER — SODIUM CHLORIDE 0.9 % IV SOLN
Freq: Once | INTRAVENOUS | Status: AC
  Filled 2020-08-10: qty 250

## 2020-08-10 MED ORDER — SODIUM CHLORIDE 0.9 % IV SOLN
200.0000 mg | Freq: Once | INTRAVENOUS | Status: AC
  Administered 2020-08-10: 200 mg via INTRAVENOUS
  Filled 2020-08-10: qty 8

## 2020-08-10 NOTE — Progress Notes (Signed)
[facility] [phone]   Fax:([phone]  OFFICE PROGRESS NOTE  [name], MD [address] [place] [address]  DIAGNOSIS: stage IV (T1b, N1, M1 a) non-small cell lung cancer favoring adenocarcinoma diagnosed in October 2021.  The pathologic immunohistochemical stains suggestive of upper GI primary but there is no finding on the PET scan or imaging study to support this possibility.  Biomarker Findings Microsatellite status - MS-Stable Tumor Mutational Burden - 3 Muts/Mb Genomic Findings For a complete list of the genes assayed, please refer to the Appendix. KRAS G12C STK11 E145* 7 Disease relevant genes with no reportable alterations: ALK, BRAF, EGFR, ERBB2, MET, RET, ROS1  PDL1 Expression 0%  PRIOR THERAPY: None  CURRENT THERAPY: Systemic chemotherapy with carboplatin for AUC of 5, Alimta 500 mg/M2 and Keytruda 200 mg IV every 3 weeks.  First dose March 11, 2019.  Status post 7 cycles.  Starting from cycle #5 the patient will be on maintenance treatment with Alimta and Keytruda every 3 weeks.  INTERVAL HISTORY: Cindy [name] 75 y.o. female returns to the clinic today for follow-up visit.  The patient was recently diagnosed with COVID-19 on Jul 25, 2020 and treated with Paxlovid by her primary care physician.  She continues to have mild dry cough.  She denied having any current chest pain but has shortness of breath with exertion with no hemoptysis.  She denied having any recent weight loss or night sweats.  She has no nausea, vomiting, diarrhea or constipation.  She denied having any significant weight loss or night sweats.  Her treatment was delayed by 2 weeks until recovery from the recent COVID infection.  She is here today for evaluation before resuming her treatment with cycle #8.    MEDICAL HISTORY: Past Medical History:  Diagnosis Date   Anxiety    Depression    Hyperlipidemia    Hypertension      ALLERGIES:  is allergic to escitalopram and procaine.  MEDICATIONS:  Current Outpatient Medications  Medication Sig Dispense Refill   ALPRAZolam (XANAX) 0.25 MG tablet Take 0.25 mg by mouth daily as needed for anxiety.     Ascorbic Acid (VITAMIN C PO) Take 1 tablet by mouth daily.     aspirin 81 MG tablet Take 81 mg by mouth daily.     b complex vitamins capsule Take 1 capsule by mouth 2 (two) times a week.     calcium carbonate (TUMS - DOSED IN MG ELEMENTAL CALCIUM) 500 MG chewable tablet Chew 500-1,000 mg by mouth daily as needed for indigestion or heartburn.     diphenhydrAMINE (BENADRYL) 50 MG tablet Take 1 tablet (50 mg total) by mouth at bedtime as needed for itching. Take one tablet one hour prior to CT scan 30 tablet 0   folic acid (FOLVITE) 1 MG tablet TAKE 1 TABLET(1 MG) BY MOUTH DAILY 30 tablet 4   hydrochlorothiazide (HYDRODIURIL) 25 MG tablet Take 25 mg by mouth daily.     HYDROcodone bit-homatropine (HYCODAN) 5-1.5 MG/5ML syrup Take 5 mLs by mouth every 6 hours as needed for cough. 120 mL 0   levothyroxine (SYNTHROID) 50 MCG tablet Take 1 tablet (50 mcg total) by mouth daily before breakfast. 30 tablet 1   lidocaine-prilocaine (EMLA) cream Apply to the Port-A-Cath site 30-60-minute before chemotherapy. 30 g 0   melatonin 3 MG TABS tablet at bedtime as needed.     metoprolol succinate (TOPROL-XL) 50 MG 24 hr tablet Take  50 mg by mouth daily. Take with or immediately following a meal.     Multiple Minerals-Vitamins (CALCIUM-MAGNESIUM-ZINC-D3 PO) Take 1 tablet by mouth 2 (two) times a week.     Multiple Vitamin (MULTIVITAMIN) tablet Take 1 tablet by mouth 3 (three) times a week.     oxyCODONE-acetaminophen (PERCOCET/ROXICET) 5-325 MG tablet TAKE 1 TABLET BY MOUTH EVERY 6 HOURS AS NEEDED FOR SEVERE PAIN. 30 tablet 0   Polyvinyl Alcohol-Povidone (REFRESH OP) Place 1 drop into both eyes daily as needed (dry eyes).     predniSONE (DELTASONE) 50 MG tablet Take one tablet 13 hours ,  one table seven hours and one tablet 1 hour prior to CT scan. 3 tablet 3   prochlorperazine (COMPAZINE) 10 MG tablet TAKE 1 TABLET(10 MG) BY MOUTH EVERY 6 HOURS AS NEEDED FOR NAUSEA OR VOMITING 30 tablet 0   simvastatin (ZOCOR) 20 MG tablet Take 20 mg by mouth daily.     VITAMIN D PO Take 1 capsule by mouth 3 (three) times a week.     VITAMIN E PO Take 1 capsule by mouth 2 (two) times a week.     No current facility-administered medications for this visit.    SURGICAL HISTORY:  Past Surgical History:  Procedure Laterality Date   DENTAL SURGERY  2012 - 2013   implants   IR THORACENTESIS ASP PLEURAL SPACE W/IMG GUIDE  12/19/2019   WISDOM TOOTH EXTRACTION  20    REVIEW OF SYSTEMS:  A comprehensive review of systems was negative except for: Constitutional: positive for fatigue Respiratory: positive for cough and dyspnea on exertion   PHYSICAL EXAMINATION: General appearance: alert, cooperative, fatigued, and no distress Head: Normocephalic, without obvious abnormality, atraumatic Neck: no adenopathy, no JVD, supple, symmetrical, trachea midline, and thyroid not enlarged, symmetric, no tenderness/mass/nodules Lymph nodes: Cervical, supraclavicular, and axillary nodes normal. Resp: clear to auscultation bilaterally Back: symmetric, no curvature. ROM normal. No CVA tenderness. Cardio: regular rate and rhythm, S1, S2 normal, no murmur, click, rub or gallop GI: soft, non-tender; bowel sounds normal; no masses,  no organomegaly Extremities: extremities normal, atraumatic, no cyanosis or edema Neurologic: Alert and oriented X 3, normal strength and tone. Normal symmetric reflexes. Normal coordination and gait  ECOG PERFORMANCE STATUS: 1 - Symptomatic but completely ambulatory  Last menstrual period 09/29/2002.  LABORATORY DATA: Lab Results  Component Value Date   WBC 5.0 07/14/2020   HGB 11.1 (L) 07/14/2020   HCT 31.8 (L) 07/14/2020   MCV 97.0 07/14/2020   PLT 299 07/14/2020       Chemistry      Component Value Date/Time   NA 138 07/14/2020 1037   K 3.5 07/14/2020 1037   CL 101 07/14/2020 1037   CO2 28 07/14/2020 1037   BUN 9 07/14/2020 1037   CREATININE 0.67 07/14/2020 1037      Component Value Date/Time   CALCIUM 9.1 07/14/2020 1037   ALKPHOS 70 07/14/2020 1037   AST 34 07/14/2020 1037   ALT 25 07/14/2020 1037   BILITOT 0.4 07/14/2020 1037       RADIOGRAPHIC STUDIES: CT Chest W Contrast  Result Date: 07/14/2020 CLINICAL DATA:  Non-small cell lung cancer restaging, ongoing chemotherapy and immunotherapy EXAM: CT CHEST, ABDOMEN, AND PELVIS WITH CONTRAST TECHNIQUE: Multidetector CT imaging of the chest, abdomen and pelvis was performed following the standard protocol during bolus administration of intravenous contrast. CONTRAST:  64mL OMNIPAQUE IOHEXOL 300 MG/ML SOLN, additional oral enteric contrast COMPARISON:  05/11/2020 FINDINGS: CT CHEST FINDINGS Cardiovascular: Aortic  atherosclerosis. Normal heart size. Left coronary artery calcifications. Unchanged, trace pericardial effusion and pericardial thickening. Mediastinum/Nodes: Unchanged prominent left infrahilar lymph node, measuring 1.2 x 1.1 cm (series 2, image 32). Unchanged prominent high right paratracheal lymph node measuring 1.1 x 1.0 cm (series 2, image 18). No other enlarged mediastinal lymph nodes. Thyroid gland, trachea, and esophagus demonstrate no significant findings. Lungs/Pleura: Unchanged, small, loculated left pleural effusion with diffuse pleural thickening and nodularity. Extensive septal thickening and consolidation of the left lower lobe and lingula. Unchanged discrete pleural nodules, for example of the anterior left upper lobe measuring 1.4 x 0.8 cm (series 6, image 54). Unchanged nodule or conglomerate of the superior segment left lower lobe, measuring 3.0 x 3.0 cm (series 6, image 69). Musculoskeletal: No chest wall mass. CT ABDOMEN PELVIS FINDINGS Hepatobiliary: No solid liver abnormality is  seen. No gallstones, gallbladder wall thickening, or biliary dilatation. Pancreas: Unremarkable. No pancreatic ductal dilatation or surrounding inflammatory changes. Spleen: Normal in size without significant abnormality. Adrenals/Urinary Tract: Adrenal glands are unremarkable. Kidneys are normal, without renal calculi, solid lesion, or hydronephrosis. Bladder is unremarkable. Stomach/Bowel: Stomach is within normal limits. Appendix appears normal. No evidence of bowel wall thickening, distention, or inflammatory changes. Sigmoid diverticula. Vascular/Lymphatic: Aortic atherosclerosis. No enlarged abdominal or pelvic lymph nodes. Reproductive: No mass or other abnormality. Other: No abdominal wall hernia or abnormality. No abdominopelvic ascites. Musculoskeletal: Multiple sclerotic osseous lesions throughout the skeleton, unchanged, including of the manubrium, sternal body, T1, T2, T3, L1, L2, L3, and S1 vertebral bodies (series 5, image 96). IMPRESSION: 1. Unchanged, small, loculated left pleural effusion with diffuse pleural thickening and nodularity as well as extensive septal thickening and consolidation of the left lower lobe and lingula, consistent with pleural and lymphangitic metastatic disease. 2. Unchanged discrete pleural nodules and unchanged nodule or conglomerate of the superior segment left lower lobe. 3. Unchanged prominent left infrahilar and high right paratracheal lymph nodes. 4. Multiple sclerotic osseous metastatic lesions throughout the skeleton, unchanged. 5. No evidence of new metastatic disease in the chest, abdomen, or pelvis. 6. Coronary artery disease. Aortic Atherosclerosis (ICD10-I70.0). Electronically Signed   By: Eddie Candle M.D.   On: 07/14/2020 09:38   CT Abdomen Pelvis W Contrast  Result Date: 07/14/2020 CLINICAL DATA:  Non-small cell lung cancer restaging, ongoing chemotherapy and immunotherapy EXAM: CT CHEST, ABDOMEN, AND PELVIS WITH CONTRAST TECHNIQUE: Multidetector CT  imaging of the chest, abdomen and pelvis was performed following the standard protocol during bolus administration of intravenous contrast. CONTRAST:  54mL OMNIPAQUE IOHEXOL 300 MG/ML SOLN, additional oral enteric contrast COMPARISON:  05/11/2020 FINDINGS: CT CHEST FINDINGS Cardiovascular: Aortic atherosclerosis. Normal heart size. Left coronary artery calcifications. Unchanged, trace pericardial effusion and pericardial thickening. Mediastinum/Nodes: Unchanged prominent left infrahilar lymph node, measuring 1.2 x 1.1 cm (series 2, image 32). Unchanged prominent high right paratracheal lymph node measuring 1.1 x 1.0 cm (series 2, image 18). No other enlarged mediastinal lymph nodes. Thyroid gland, trachea, and esophagus demonstrate no significant findings. Lungs/Pleura: Unchanged, small, loculated left pleural effusion with diffuse pleural thickening and nodularity. Extensive septal thickening and consolidation of the left lower lobe and lingula. Unchanged discrete pleural nodules, for example of the anterior left upper lobe measuring 1.4 x 0.8 cm (series 6, image 54). Unchanged nodule or conglomerate of the superior segment left lower lobe, measuring 3.0 x 3.0 cm (series 6, image 69). Musculoskeletal: No chest wall mass. CT ABDOMEN PELVIS FINDINGS Hepatobiliary: No solid liver abnormality is seen. No gallstones, gallbladder wall thickening, or biliary dilatation.  Pancreas: Unremarkable. No pancreatic ductal dilatation or surrounding inflammatory changes. Spleen: Normal in size without significant abnormality. Adrenals/Urinary Tract: Adrenal glands are unremarkable. Kidneys are normal, without renal calculi, solid lesion, or hydronephrosis. Bladder is unremarkable. Stomach/Bowel: Stomach is within normal limits. Appendix appears normal. No evidence of bowel wall thickening, distention, or inflammatory changes. Sigmoid diverticula. Vascular/Lymphatic: Aortic atherosclerosis. No enlarged abdominal or pelvic lymph  nodes. Reproductive: No mass or other abnormality. Other: No abdominal wall hernia or abnormality. No abdominopelvic ascites. Musculoskeletal: Multiple sclerotic osseous lesions throughout the skeleton, unchanged, including of the manubrium, sternal body, T1, T2, T3, L1, L2, L3, and S1 vertebral bodies (series 5, image 96). IMPRESSION: 1. Unchanged, small, loculated left pleural effusion with diffuse pleural thickening and nodularity as well as extensive septal thickening and consolidation of the left lower lobe and lingula, consistent with pleural and lymphangitic metastatic disease. 2. Unchanged discrete pleural nodules and unchanged nodule or conglomerate of the superior segment left lower lobe. 3. Unchanged prominent left infrahilar and high right paratracheal lymph nodes. 4. Multiple sclerotic osseous metastatic lesions throughout the skeleton, unchanged. 5. No evidence of new metastatic disease in the chest, abdomen, or pelvis. 6. Coronary artery disease. Aortic Atherosclerosis (ICD10-I70.0). Electronically Signed   By: Eddie Candle M.D.   On: 07/14/2020 09:38     ASSESSMENT AND PLAN: This is a very pleasant 75 years old white female recently diagnosed with a stage IV (T1b, N2, M1 a) non-small cell lung cancer, adenocarcinoma with no actionable mutations based on the molecular studies by [facility]. The patient underwent CT-guided core biopsy of one of the left lung pleural-based nodules by interventional radiology and the final pathology was consistent with metastatic adenocarcinoma. Her tissue block was [facility] for molecular studies and PD-L1 expression.  The molecular studies by [facility] showed positive K-ras G12C mutation which will be an option for targeted therapy in the second line setting with Lumakras (Sotorasib). The patient is currently undergoing palliative systemic chemotherapy with carboplatin for AUC of 5, Alimta 500 mg/M2 and Keytruda 200 mg IV every 3 weeks.  Status  post 7 cycles.  Starting from cycle #5 the patient will be on maintenance treatment with Alimta and Keytruda every 3 weeks. The patient has been tolerating her treatment well with no concerning adverse effects.  Her treatment was delayed by 2 weeks because of the recent COVID-19 infection which was more than 2 weeks ago. She is feeling much better except for mild dry cough and shortness of breath with exertion. I recommended for her to proceed with cycle #8 today as planned. I will see the patient back for follow-up visit in 3 weeks for evaluation before the next cycle of her treatment. For the hypothyroidism, I will give her a refill of levothyroxine. For the history of depression, the patient will resume her treatment with Prozac and I will refer her to psychiatry for evaluation based on her request. For IV access, she will call radiology to reschedule her Port-A-Cath placement For pain management she will continue on Percocet on as-needed basis. The patient was advised to call immediately if she has any other concerning symptoms in the interval.  The patient voices understanding of current disease status and treatment options and is in agreement with the current care plan.  All questions were answered. The patient knows to call the clinic with any problems, questions or concerns. We can certainly see the patient much sooner if necessary.  Disclaimer: This note was dictated with voice  recognition software. Similar sounding words can inadvertently be transcribed and may not be corrected upon review.

## 2020-08-10 NOTE — Patient Instructions (Signed)
[facility]  Discharge Instructions: Thank you for choosing [facility] to provide your oncology and hematology care.   If you have a lab appointment with the [facility], please go directly to the [facility] and check in at the registration area.   Wear comfortable clothing and clothing appropriate for easy access to any Portacath or PICC line.   We strive to give you quality time with your provider. You may need to reschedule your appointment if you arrive late (15 or more minutes).  Arriving late affects you and other patients whose appointments are after yours.  Also, if you miss three or more appointments without notifying the office, you may be dismissed from the clinic at the provider's discretion.      For prescription refill requests, have your pharmacy contact our office and allow 72 hours for refills to be completed.    Today you received the following chemotherapy and/or immunotherapy agents Keytruda, alimta    To help prevent nausea and vomiting after your treatment, we encourage you to take your nausea medication as directed.  BELOW ARE SYMPTOMS THAT SHOULD BE REPORTED IMMEDIATELY: *FEVER GREATER THAN 100.4 F (38 C) OR HIGHER *CHILLS OR SWEATING *NAUSEA AND VOMITING THAT IS NOT CONTROLLED WITH YOUR NAUSEA MEDICATION *UNUSUAL SHORTNESS OF BREATH *UNUSUAL BRUISING OR BLEEDING *URINARY PROBLEMS (pain or burning when urinating, or frequent urination) *BOWEL PROBLEMS (unusual diarrhea, constipation, pain near the anus) TENDERNESS IN MOUTH AND THROAT WITH OR WITHOUT PRESENCE OF ULCERS (sore throat, sores in mouth, or a toothache) UNUSUAL RASH, SWELLING OR PAIN  UNUSUAL VAGINAL DISCHARGE OR ITCHING   Items with * indicate a potential emergency and should be followed up as soon as possible or go to the Emergency Department if any problems should occur.  Please show the CHEMOTHERAPY ALERT CARD or IMMUNOTHERAPY ALERT CARD at check-in  to the Emergency Department and triage nurse.  Should you have questions after your visit or need to cancel or reschedule your appointment, please contact [facility]  Dept: [phone]  and follow the prompts.  Office hours are 8:00 a.m. to 4:30 p.m. Monday - Friday. Please note that voicemails left after 4:00 p.m. may not be returned until the following business day.  We are closed weekends and major holidays. You have access to a nurse at all times for urgent questions. Please call the main number to the clinic Dept: [phone] and follow the prompts.   For any non-urgent questions, you may also contact your provider using MyChart. We now offer e-Visits for anyone 73 and older to request care online for non-urgent symptoms. For details visit [email or web address].   Also download the MyChart app! Go to the app store, search "MyChart", open the app, select [facility], and log in with your MyChart username and password.  Due to Covid, a mask is required upon entering the hospital/clinic. If you do not have a mask, one will be given to you upon arrival. For doctor visits, patients may have 1 support person aged 73 or older with them. For treatment visits, patients cannot have anyone with them due to current Covid guidelines and our immunocompromised population.

## 2020-08-11 ENCOUNTER — Other Ambulatory Visit: Payer: Self-pay | Admitting: Internal Medicine

## 2020-08-11 DIAGNOSIS — E039 Hypothyroidism, unspecified: Secondary | ICD-10-CM

## 2020-08-12 NOTE — Telephone Encounter (Signed)
Prescription signed and sent 2 days ago. Gardiner Rhyme, RN

## 2020-08-17 ENCOUNTER — Other Ambulatory Visit: Payer: Self-pay | Admitting: Physician Assistant

## 2020-08-17 ENCOUNTER — Other Ambulatory Visit (HOSPITAL_COMMUNITY): Payer: Self-pay

## 2020-08-17 ENCOUNTER — Encounter: Payer: Self-pay | Admitting: Internal Medicine

## 2020-08-17 DIAGNOSIS — E039 Hypothyroidism, unspecified: Secondary | ICD-10-CM

## 2020-08-17 MED ORDER — HYDROCHLOROTHIAZIDE 25 MG PO TABS
25.0000 mg | ORAL_TABLET | Freq: Every morning | ORAL | 1 refills | Status: DC
  Filled 2020-11-03: qty 90, 90d supply, fill #0

## 2020-08-17 MED ORDER — SIMVASTATIN 20 MG PO TABS
20.0000 mg | ORAL_TABLET | Freq: Every evening | ORAL | 1 refills | Status: DC
  Filled 2020-10-06: qty 90, 90d supply, fill #0

## 2020-08-17 MED ORDER — METOPROLOL SUCCINATE ER 50 MG PO TB24: 50.0000 mg | ORAL_TABLET | Freq: Every day | ORAL | 2 refills | Status: DC

## 2020-08-17 MED ORDER — METOPROLOL SUCCINATE ER 50 MG PO TB24
50.0000 mg | ORAL_TABLET | Freq: Every day | ORAL | 3 refills | Status: DC
  Filled 2020-10-15 – 2020-11-03 (×2): qty 90, 90d supply, fill #0
  Filled 2021-02-04: qty 90, 90d supply, fill #1

## 2020-08-17 MED ORDER — SIMVASTATIN 20 MG PO TABS: 20.0000 mg | ORAL_TABLET | Freq: Every evening | ORAL | 1 refills | Status: DC

## 2020-08-17 MED FILL — Folic Acid Tab 1 MG: ORAL | 30 days supply | Qty: 30 | Fill #0 | Status: CN

## 2020-08-18 ENCOUNTER — Encounter: Payer: Self-pay | Admitting: Internal Medicine

## 2020-08-19 ENCOUNTER — Other Ambulatory Visit: Payer: Self-pay | Admitting: Physician Assistant

## 2020-08-19 ENCOUNTER — Encounter: Payer: Self-pay | Admitting: Internal Medicine

## 2020-08-19 DIAGNOSIS — E876 Hypokalemia: Secondary | ICD-10-CM

## 2020-08-19 NOTE — Telephone Encounter (Signed)
I spoke with pt. She states she strongly feels her itching is due to a manufacturer difference with her Synthroid and has transferred her rx to [facility]. No further assistance needed for this.  With regard to her "leg infection" she states she had been scratching herself with a "scratchy pad" of some sort and may had scratched too much, thus causing her redness and irritation to the area. Pt didn't want any advice about this, she states she just wanted it documented. No further assistance needed for this at this time.

## 2020-08-20 ENCOUNTER — Other Ambulatory Visit: Payer: Self-pay

## 2020-08-20 ENCOUNTER — Ambulatory Visit (HOSPITAL_COMMUNITY)
Admission: RE | Admit: 2020-08-20 | Discharge: 2020-08-20 | Discharge status: Against Medical Advice | Payer: Medicare PPO | Source: Ambulatory Visit | Attending: Family Medicine | Admitting: Family Medicine

## 2020-08-21 ENCOUNTER — Inpatient Hospital Stay: Payer: Medicare PPO | Admitting: General Practice

## 2020-08-21 ENCOUNTER — Encounter: Payer: Self-pay | Admitting: General Practice

## 2020-08-21 NOTE — Progress Notes (Signed)
[facility]  Clinical Social Work was referred by [facility]  to review and complete healthcare advance directives.  Clinical Social Worker and Teaching laboratory technician met with patient in [employer].  The patient designated Domingo Cocking as their primary healthcare agent and Lamonte Richer as their secondary agent.  Patient also completed healthcare living will.    Clinical Social Worker notarized documents and made copies for patient/family. Clinical Social Worker will send documents to medical records to be scanned into patient's chart. Clinical Social Worker encouraged patient/family to contact with any additional questions or concerns.  Edwyna Shell, LCSW Clinical Social Worker Phone:  [phone]

## 2020-08-25 ENCOUNTER — Other Ambulatory Visit: Payer: Medicare PPO

## 2020-08-25 ENCOUNTER — Ambulatory Visit: Payer: Medicare PPO

## 2020-08-25 ENCOUNTER — Ambulatory Visit: Payer: Medicare PPO | Admitting: Internal Medicine

## 2020-08-26 ENCOUNTER — Other Ambulatory Visit: Payer: Self-pay | Admitting: Radiology

## 2020-08-26 NOTE — H&P (Signed)
Chief Complaint: Patient was seen in consultation today for lung cancer/Port-a-cath placement.  Referring Physician(s): [facility] (oncology)  Supervising Physician: Markus Daft  Patient Status: [facility] - Out-pt  History of Present Illness: Cindy [name] is a 75 y.o. female with a past medical history of hypertension, hyperlipidemia, lung cancer, COVID-19 infection 06/2020, anxiety, and depression. She was unfortunately diagnosed with lung cancer (adenocarcinoma of left lung, stage IV), managed by Dr. Julien Nordmann. She is currently undergoing systemic chemotherapy a management, in need of durable IV access for administration.  IR consulted by Dr. Julien Nordmann for possible image-guided Port-a-cath placement. Patient awake and alert laying in bed with no complaints at this time. Denies fever, chills, chest pain, dyspnea, abdominal pain, or headache.   Past Medical History:  Diagnosis Date   Anxiety    Depression    Hyperlipidemia    Hypertension     Past Surgical History:  Procedure Laterality Date   DENTAL SURGERY  2012 - 2013   implants   IR THORACENTESIS ASP PLEURAL SPACE W/IMG GUIDE  12/19/2019   WISDOM TOOTH EXTRACTION  20    Allergies: Escitalopram and Procaine  Medications: Prior to Admission medications   Medication Sig Start Date End Date Taking? Authorizing Provider  ALPRAZolam Duanne Moron) 0.25 MG tablet Take 0.25 mg by mouth daily as needed for anxiety. 07/30/13   [provider]  Ascorbic Acid (VITAMIN C PO) Take 1 tablet by mouth daily.    [provider]  aspirin 81 MG tablet Take 81 mg by mouth daily.    [provider]  b complex vitamins capsule Take 1 capsule by mouth 2 (two) times a week.    [provider]  calcium carbonate (TUMS - DOSED IN MG ELEMENTAL CALCIUM) 500 MG chewable tablet Chew 500-1,000 mg by mouth daily as needed for indigestion or heartburn.    [provider]  diphenhydrAMINE (BENADRYL) 50 MG tablet  Take 1 tablet (50 mg total) by mouth at bedtime as needed for itching. Take one tablet one hour prior to CT scan 07/10/20   Heilingoetter, Cassandra L, PA-C  folic acid (FOLVITE) 1 MG tablet TAKE 1 TABLET(1 MG) BY MOUTH DAILY 07/23/20   Curt Bears, MD  hydrochlorothiazide (HYDRODIURIL) 25 MG tablet Take 25 mg by mouth daily.    [provider]  hydrochlorothiazide (HYDRODIURIL) 25 MG tablet Take 1 tablet (25 mg total) by mouth every morning. 03/11/20     HYDROcodone bit-homatropine (HYCODAN) 5-1.5 MG/5ML syrup Take 5 mLs by mouth every 6 hours as needed for cough. 07/24/20   Heilingoetter, Cassandra L, PA-C  levothyroxine (SYNTHROID) 50 MCG tablet Take 1 tablet (50 mcg total) by mouth daily before breakfast. 08/10/20   Curt Bears, MD  lidocaine-prilocaine (EMLA) cream Apply to the Port-A-Cath site 30-60-minute before chemotherapy. 07/14/20   Curt Bears, MD  melatonin 3 MG TABS tablet at bedtime as needed.    [provider]  metoprolol succinate (TOPROL-XL) 50 MG 24 hr tablet Take 50 mg by mouth daily. Take with or immediately following a meal.    [provider]  metoprolol succinate (TOPROL-XL) 50 MG 24 hr tablet Take 1 tablet (50 mg total) by mouth daily as directed 03/11/20     metoprolol succinate (TOPROL-XL) 50 MG 24 hr tablet Take 1 tablet (50 mg total) by mouth daily. 08/12/20     Multiple Minerals-Vitamins (CALCIUM-MAGNESIUM-ZINC-D3 PO) Take 1 tablet by mouth 2 (two) times a week.    [provider]  Multiple Vitamin (MULTIVITAMIN) tablet Take  1 tablet by mouth 3 (three) times a week.    [provider]  oxyCODONE-acetaminophen (PERCOCET/ROXICET) 5-325 MG tablet TAKE 1 TABLET BY MOUTH EVERY 6 HOURS AS NEEDED FOR SEVERE PAIN. 04/29/20 10/26/20  Curt Bears, MD  Polyvinyl Alcohol-Povidone (REFRESH OP) Place 1 drop into both eyes daily as needed (dry eyes).    [provider]  potassium chloride SA (KLOR-CON) 20 MEQ tablet Take 1  tablet (20 mEq total) by mouth daily. 08/10/20   Heilingoetter, Cassandra L, PA-C  predniSONE (DELTASONE) 50 MG tablet Take one tablet 13 hours , one table seven hours and one tablet 1 hour prior to CT scan. 07/10/20   Heilingoetter, Cassandra L, PA-C  prochlorperazine (COMPAZINE) 10 MG tablet TAKE 1 TABLET(10 MG) BY MOUTH EVERY 6 HOURS AS NEEDED FOR NAUSEA OR VOMITING 03/23/20   Curt Bears, MD  simvastatin (ZOCOR) 20 MG tablet Take 20 mg by mouth daily. 12/18/19   [provider]  simvastatin (ZOCOR) 20 MG tablet Take 1 tablet (20 mg total) by mouth every evening. 03/11/20     simvastatin (ZOCOR) 20 MG tablet Take 1 tablet (20 mg total) by mouth every evening. 09/19/19     VITAMIN D PO Take 1 capsule by mouth 3 (three) times a week.    [provider]  VITAMIN E PO Take 1 capsule by mouth 2 (two) times a week.    [provider]  mirtazapine (REMERON) 30 MG tablet Take 1 tablet (30 mg total) by mouth at bedtime. 03/31/20 04/21/20  Curt Bears, MD     Family History  Problem Relation Age of Onset   Dementia Mother    COPD Mother    Lung cancer Maternal Grandmother    Cirrhosis Brother     Social History   Socioeconomic History   Marital status: Widowed    Spouse name: Not on file   Number of children: 0   Years of education: Not on file   Highest education level: Not on file  Occupational History    Employer: [employer]  Tobacco Use   Smoking status: Former    Packs/day: 2.00    Years: 15.00    Pack years: 30.00    Types: Cigarettes    Quit date: 1985    Years since quitting: 37.5   Smokeless tobacco: Never  Substance and Sexual Activity   Alcohol use: Yes    Alcohol/week: 10.0 standard drinks    Types: 10 Standard drinks or equivalent per week   Drug use: No   Sexual activity: Never    Birth control/protection: Abstinence, Post-menopausal  Other Topics Concern   Not on file  Social History Narrative   Not on file   Social  Determinants of Health   Financial Resource Strain: Not on file  Food Insecurity: Not on file  Transportation Needs: Not on file  Physical Activity: Not on file  Stress: Not on file  Social Connections: Not on file     Review of Systems: A 12 point ROS discussed and pertinent positives are indicated in the HPI above.  All other systems are negative.  Review of Systems  Constitutional:  Negative for chills and fever.  Respiratory:  Negative for shortness of breath and wheezing.   Cardiovascular:  Negative for chest pain and palpitations.  Gastrointestinal:  Negative for abdominal pain.  Neurological:  Negative for headaches.  Psychiatric/Behavioral:  Negative for confusion.    Vital Signs: BP 140/63   Pulse 74   Temp 98.8  F (37.1 C) (Oral)   Resp 15   LMP 09/29/2002   SpO2 98%   Physical Exam Vitals and nursing note reviewed.  Constitutional:      General: She is not in acute distress. Cardiovascular:     Rate and Rhythm: Normal rate and regular rhythm.     Heart sounds: Normal heart sounds. No murmur heard. Pulmonary:     Effort: Pulmonary effort is normal. No respiratory distress.     Breath sounds: Normal breath sounds. No wheezing.  Skin:    General: Skin is warm and dry.  Neurological:     Mental Status: She is alert and oriented to person, place, and time.     MD Evaluation Airway: WNL Heart: WNL Abdomen: WNL Chest/ Lungs: WNL ASA  Classification: 3 Mallampati/Airway Score: Two   Imaging: No results found.  Labs:  CBC: Recent Labs    06/02/20 1032 06/23/20 0928 07/14/20 1037 08/10/20 1339  WBC 3.7* 4.2 5.0 5.4  HGB 10.8* 11.0* 11.1* 11.3*  HCT 31.9* 32.4* 31.8* 32.3*  PLT 300 329 299 197    COAGS: Recent Labs    02/13/20 0930  INR 0.9    BMP: Recent Labs    06/02/20 1032 06/23/20 0928 07/14/20 1037 08/10/20 1339  NA 140 140 138 136  K 3.3* 3.3* 3.5 3.1*  CL 102 103 101 100  CO2 25 27 28 27   GLUCOSE 116* 88 102* 114*   BUN 9 11 9 11   CALCIUM 9.1 9.2 9.1 9.2  CREATININE 0.72 0.70 0.67 0.70  GFRNONAA >60 >60 >60 >60    LIVER FUNCTION TESTS: Recent Labs    06/02/20 1032 06/23/20 0928 07/14/20 1037 08/10/20 1339  BILITOT 0.5 0.5 0.4 0.3  AST 27 29 34 37  ALT 28 25 25 26   ALKPHOS 81 71 70 71  PROT 7.5 7.2 7.0 7.2  ALBUMIN 4.0 3.8 3.5 3.6      Assessment and Plan:  Lung cancer (adenocarcinoma of left lung, stage IV) who currently is undergoing systemic chemotherapy for management in need of durable IV access for administration. Plan for image-guided Port-a-cath placement today in IR. Patient is NPO. Afebrile.  Risks and benefits of image-guided Port-a-catheter placement were discussed with the patient including, but not limited to bleeding, infection, pneumothorax, or fibrin sheath development and need for additional procedures. All of the patient's questions were answered, patient is agreeable to proceed. Consent signed and in chart.   Thank you for this interesting consult.  I greatly enjoyed meeting Cindy [name] and look forward to participating in their care.  A copy of this report was sent to the requesting provider on this date.  Electronically Signed: Earley Abide, PA-C 08/27/2020, 10:29 AM   I spent a total of 25 Minutes in face to face in clinical consultation, greater than 50% of which was counseling/coordinating care for lung cancer/Port-a-cath placement.

## 2020-08-27 ENCOUNTER — Ambulatory Visit (HOSPITAL_COMMUNITY)
Admission: RE | Admit: 2020-08-27 | Discharge: 2020-08-27 | Disposition: A | Discharge status: Home / Self Care | Payer: Medicare PPO | Source: Ambulatory Visit | Attending: Internal Medicine | Admitting: Internal Medicine

## 2020-08-27 ENCOUNTER — Encounter (HOSPITAL_COMMUNITY): Payer: Self-pay

## 2020-08-27 ENCOUNTER — Other Ambulatory Visit: Payer: Self-pay | Admitting: Internal Medicine

## 2020-08-27 ENCOUNTER — Other Ambulatory Visit: Payer: Self-pay

## 2020-08-27 DIAGNOSIS — Z8616 Personal history of COVID-19: Secondary | ICD-10-CM | POA: Insufficient documentation

## 2020-08-27 DIAGNOSIS — Z888 Allergy status to other drugs, medicaments and biological substances status: Secondary | ICD-10-CM | POA: Diagnosis not present

## 2020-08-27 DIAGNOSIS — C3492 Malignant neoplasm of unspecified part of left bronchus or lung: Secondary | ICD-10-CM

## 2020-08-27 DIAGNOSIS — Z7982 Long term (current) use of aspirin: Secondary | ICD-10-CM | POA: Diagnosis not present

## 2020-08-27 DIAGNOSIS — Z79890 Hormone replacement therapy (postmenopausal): Secondary | ICD-10-CM | POA: Insufficient documentation

## 2020-08-27 DIAGNOSIS — Z79899 Other long term (current) drug therapy: Secondary | ICD-10-CM | POA: Diagnosis not present

## 2020-08-27 DIAGNOSIS — Z87891 Personal history of nicotine dependence: Secondary | ICD-10-CM | POA: Insufficient documentation

## 2020-08-27 DIAGNOSIS — I10 Essential (primary) hypertension: Secondary | ICD-10-CM | POA: Insufficient documentation

## 2020-08-27 DIAGNOSIS — F419 Anxiety disorder, unspecified: Secondary | ICD-10-CM | POA: Insufficient documentation

## 2020-08-27 DIAGNOSIS — E785 Hyperlipidemia, unspecified: Secondary | ICD-10-CM | POA: Diagnosis not present

## 2020-08-27 DIAGNOSIS — C3490 Malignant neoplasm of unspecified part of unspecified bronchus or lung: Secondary | ICD-10-CM | POA: Diagnosis not present

## 2020-08-27 DIAGNOSIS — Z452 Encounter for adjustment and management of vascular access device: Secondary | ICD-10-CM | POA: Diagnosis not present

## 2020-08-27 HISTORY — PX: IR IMAGING GUIDED PORT INSERTION: IMG5740

## 2020-08-27 LAB — CBC WITH DIFFERENTIAL/PLATELET
Abs Immature Granulocytes: 0.02 10*3/uL (ref 0.00–0.07)
Basophils Absolute: 0 10*3/uL (ref 0.0–0.1)
Basophils Relative: 0 %
Eosinophils Absolute: 0.1 10*3/uL (ref 0.0–0.5)
Eosinophils Relative: 1 %
HCT: 33.9 % — ABNORMAL LOW (ref 36.0–46.0)
Hemoglobin: 11.4 g/dL — ABNORMAL LOW (ref 12.0–15.0)
Immature Granulocytes: 0 %
Lymphocytes Relative: 21 %
Lymphs Abs: 1 10*3/uL (ref 0.7–4.0)
MCH: 32.9 pg (ref 26.0–34.0)
MCHC: 33.6 g/dL (ref 30.0–36.0)
MCV: 98 fL (ref 80.0–100.0)
Monocytes Absolute: 0.7 10*3/uL (ref 0.1–1.0)
Monocytes Relative: 15 %
Neutro Abs: 2.9 10*3/uL (ref 1.7–7.7)
Neutrophils Relative %: 63 %
Platelets: 325 10*3/uL (ref 150–400)
RBC: 3.46 MIL/uL — ABNORMAL LOW (ref 3.87–5.11)
RDW: 13.3 % (ref 11.5–15.5)
WBC: 4.7 10*3/uL (ref 4.0–10.5)
nRBC: 0 % (ref 0.0–0.2)

## 2020-08-27 LAB — PROTIME-INR
INR: 1 (ref 0.8–1.2)
Prothrombin Time: 13.3 seconds (ref 11.4–15.2)

## 2020-08-27 MED ORDER — HEPARIN SOD (PORK) LOCK FLUSH 100 UNIT/ML IV SOLN
INTRAVENOUS | Status: AC
  Filled 2020-08-27: qty 5

## 2020-08-27 MED ORDER — MIDAZOLAM HCL 2 MG/2ML IJ SOLN
INTRAMUSCULAR | Status: AC
  Filled 2020-08-27: qty 4

## 2020-08-27 MED ORDER — SODIUM CHLORIDE 0.9 % IV SOLN: INTRAVENOUS | Status: DC

## 2020-08-27 MED ORDER — LIDOCAINE-EPINEPHRINE 1 %-1:100000 IJ SOLN
INTRAMUSCULAR | Status: AC
  Filled 2020-08-27: qty 1

## 2020-08-27 MED ORDER — LIDOCAINE HCL 1 % IJ SOLN
INTRAMUSCULAR | Status: AC
  Filled 2020-08-27: qty 20

## 2020-08-27 MED ORDER — HEPARIN SOD (PORK) LOCK FLUSH 100 UNIT/ML IV SOLN
INTRAVENOUS | Status: AC | PRN
  Administered 2020-08-27: 500 [IU] via INTRAVENOUS

## 2020-08-27 MED ORDER — MIDAZOLAM HCL 2 MG/2ML IJ SOLN
INTRAMUSCULAR | Status: AC | PRN
  Administered 2020-08-27 (×4): 1 mg via INTRAVENOUS

## 2020-08-27 MED ORDER — FENTANYL CITRATE (PF) 100 MCG/2ML IJ SOLN
INTRAMUSCULAR | Status: AC
  Filled 2020-08-27: qty 2

## 2020-08-27 MED ORDER — LIDOCAINE HCL 1 % IJ SOLN
INTRAMUSCULAR | Status: AC | PRN
  Administered 2020-08-27: 20 mL

## 2020-08-27 MED ORDER — FENTANYL CITRATE (PF) 100 MCG/2ML IJ SOLN
INTRAMUSCULAR | Status: AC | PRN
  Administered 2020-08-27 (×2): 50 ug via INTRAVENOUS

## 2020-08-27 NOTE — Discharge Instructions (Signed)
Interventional radiology phone numbers °[phone] °After hours [phone] ° ° ° °You have skin glue (dermabond) over your new port. Do not use the lidocaine cream (EMLA cream) over the skin glue until it has healed. The petroleum in the lidocaine cream will dissolve the skin glue resulting in an infection of your new port. Use ice in a zip lock bag for 1-2 minutes over your new port before the cancer center nurses access your port. ° ° °Implanted Port Insertion, Care After °This sheet gives you information about how to care for yourself after your procedure. Your health care provider may also give you more specific instructions. If you have problems or questions, contact your health care provider. °What can I expect after the procedure? °After the procedure, it is common to have: °Discomfort at the port insertion site. °Bruising on the skin over the port. This should improve over 3-4 days. °Follow these instructions at home: °Port care °After your port is placed, you will get a manufacturer's information card. The card has information about your port. Keep this card with you at all times. °Take care of the port as told by your health care provider. Ask your health care provider if you or a family member can get training for taking care of the port at home. A home health care nurse may also take care of the port. °Make sure to remember what type of port you have. °Incision care °Follow instructions from your health care provider about how to take care of your port insertion site. Make sure you: °Wash your hands with soap and water before and after you change your bandage (dressing). If soap and water are not available, use hand sanitizer. °Change your dressing as told by your health care provider. °Leave skin glue in place. These skin closures may need to stay in place for 2 weeks or longer.  °Check your port insertion site every day for signs of infection. Check for: °Redness, swelling, or pain. °Fluid or  blood. °Warmth. °Pus or a bad smell.  °  °  °Activity °Return to your normal activities as told by your health care provider. Ask your health care provider what activities are safe for you. °Do not lift anything that is heavier than 10 lb (4.5 kg), or the limit that you are told, until your health care provider says that it is safe. °General instructions °Take over-the-counter and prescription medicines only as told by your health care provider. °Do not take baths, swim, or use a hot tub until your health care provider approves.You may remove your dressing tomorrow and shower 24 hours after your procedure. °Do not drive for 24 hours if you were given a sedative during your procedure. °Wear a medical alert bracelet in case of an emergency. This will tell any health care providers that you have a port. °Keep all follow-up visits as told by your health care provider. This is important. °Contact a health care provider if: °You cannot flush your port with saline as directed, or you cannot draw blood from the port. °You have a fever or chills. °You have redness, swelling, or pain around your port insertion site. °You have fluid or blood coming from your port insertion site. °Your port insertion site feels warm to the touch. °You have pus or a bad smell coming from the port insertion site. °Get help right away if: °You have chest pain or shortness of breath. °You have bleeding from your port that you cannot control. °Summary °Take care of   the port as told by your health care provider. Keep the manufacturer's information card with you at all times. °Change your dressing as told by your health care provider. °Contact a health care provider if you have a fever or chills or if you have redness, swelling, or pain around your port insertion site. °Keep all follow-up visits as told by your health care provider. °This information is not intended to replace advice given to you by your health care provider. Make sure you discuss any  questions you have with your health care provider. °Document Revised: 09/12/2017 Document Reviewed: 09/12/2017 °Elsevier Patient Education © 2021 [employer] ° ° ° °Moderate Conscious Sedation, Adult, Care After °This sheet gives you information about how to care for yourself after your procedure. Your health care provider may also give you more specific instructions. If you have problems or questions, contact your health care provider. °What can I expect after the procedure? °After the procedure, it is common to have: °Sleepiness for several hours. °Impaired judgment for several hours. °Difficulty with balance. °Vomiting if you eat too soon. °Follow these instructions at home: °For the time period you were told by your health care provider: °Rest. °Do not participate in activities where you could fall or become injured. °Do not drive or use machinery. °Do not drink alcohol. °Do not take sleeping pills or medicines that cause drowsiness. °Do not make important decisions or sign legal documents. °Do not take care of children on your own.  °  °  °Eating and drinking °Follow the diet recommended by your health care provider. °Drink enough fluid to keep your urine pale yellow. °If you vomit: °Drink water, juice, or soup when you can drink without vomiting. °Make sure you have little or no nausea before eating solid foods.   °General instructions °Take over-the-counter and prescription medicines only as told by your health care provider. °Have a responsible adult stay with you for the time you are told. It is important to have someone help care for you until you are awake and alert. °Do not smoke. °Keep all follow-up visits as told by your health care provider. This is important. °Contact a health care provider if: °You are still sleepy or having trouble with balance after 24 hours. °You feel light-headed. °You keep feeling nauseous or you keep vomiting. °You develop a rash. °You have a fever. °You have redness or  swelling around the IV site. °Get help right away if: °You have trouble breathing. °You have new-onset confusion at home. °Summary °After the procedure, it is common to feel sleepy, have impaired judgment, or feel nauseous if you eat too soon. °Rest after you get home. Know the things you should not do after the procedure. °Follow the diet recommended by your health care provider and drink enough fluid to keep your urine pale yellow. °Get help right away if you have trouble breathing or new-onset confusion at home. °This information is not intended to replace advice given to you by your health care provider. Make sure you discuss any questions you have with your health care provider. °Document Revised: 06/14/2019 Document Reviewed: 01/10/2019 °Elsevier Patient Education © 2021 [employer]  °

## 2020-08-27 NOTE — Procedures (Signed)
Interventional Radiology Procedure:   Indications: Stage 4 lung cancer  Procedure: Port placement  Findings: Right jugular port, tip at SVC/RA junction  Complications: None     EBL: Minimal, less than 10 ml  Plan: Discharge in one hour.  Keep port site and incisions dry for at least 24 hours.     Gaylene Moylan R. Alfard Cochrane, MD  Pager: [phone]   

## 2020-08-28 NOTE — Progress Notes (Signed)
[facility] OFFICE PROGRESS NOTE  Cindy Pillar, MD [address] [place] [address]  DIAGNOSIS: Stage IV (T1b, N1, M1 a) non-small cell lung cancer favoring adenocarcinoma diagnosed in October 2021.  The pathologic immunohistochemical stains suggestive of upper GI primary but there is no finding on the PET scan or imaging study to support this possibility.   Biomarker Findings Microsatellite status - MS-Stable Tumor Mutational Burden - 3 Muts/Mb Genomic Findings For a complete list of the genes assayed, please refer to the Appendix. KRAS G12C STK11 E145* 7 Disease relevant genes with no reportable alterations: ALK, BRAF, EGFR, ERBB2, MET, RET, ROS1  PDL1 Expression 0%  PRIOR THERAPY: None  CURRENT THERAPY: Systemic chemotherapy with carboplatin for AUC of 5, Alimta 500 mg/M2 and Keytruda 200 mg IV every 3 weeks.  First dose March 11, 2019.  Status post 8 cycles.  Starting from cycle #5 the patient will be on maintenance treatment with Alimta and Keytruda every 3 weeks.  INTERVAL HISTORY: Cindy [name] 75 y.o. female returns to the clinic today for a follow-up visit.  The patient is feeling overall well today without any concerning complaints except for worsening shortness of breath especially with exertion.  She is currently undergoing maintenance Alimta and Keytruda and she is tolerating it fairly well. In the interval, the patient developed a rash on the lateral side of her left lower extremity for which she attributes to scratching. She reports the itching was significant so she used a "kitchen scrubber" on her left lower extremity. She reports the scratching developed into a wound for which she was concerned was infected. The patient was planning to see urgent care on 08/20/2020 but due to wait times and her leg rash improving, she decided not to. She self treated the rash with neosporin and refrained from scratching the area. She reports the skin  started to peel, but it is now mostly resolved despite some redness and skin discoloration. No warmth or pain. She reports diffuse itching mostly on her chest and legs following the last two cycles of treatment. She thought is was from receiving an alternative form of synthroid which she recently switched back to the normal formulation.   The patient reports the first 10 days following her treatment is when she experiences the majority of her symptoms including significant fatigue as she states she mostly stays on her sofa. She has minimal appetite and no motivation to make food during this time although her weight is stable. Following the 10 days, her energy returns to baseline and some appetite returns although she states this is still only about half of what it used to be. As stated her shortness of breath has been worse since her last visit, and she continues to report a mild dry cough since having COVID-19 last month. She states at times her cough can be productive with phlegm but denies any hemoptysis or chest pain. She has been taking Halls cough drops which she states is helpful. She denies headaches but reports some worsening vision changes. She recently presented to her eye doctor who updated her prescription. Otherwise, the patient denies any recent fever, chills, or night sweats. She denies any nausea or vomiting. She reports she has had both diarrhea and constipation during the interval but endorses more constipation. She states during the first 10 days she may only have a bowel movement every few days. After the 10 days, she will often have a bowel movement every day, and she  does take miralax as needed which has been effective. She reports some numbness in her right toes but reports this precedes any treatment and is likely unrelated to treatment.  The patient also reports an upcoming visit with her PCP, and she is interested in restarting on an antidepressant medication. She reports feelings of  hopelessness and that she has struggled with this throughout her life. She reports trying a variety of medications in the past although she did not find them to be particularly helpful. She is interested in discussing this with her PCP and exploring options. She states she has met with our Chaplin here at the cancer center, and she found this to be helpful.  She is here today for evaluation before starting cycle #9.   MEDICAL HISTORY: Past Medical History:  Diagnosis Date   Anxiety    Depression    Hyperlipidemia    Hypertension     ALLERGIES:  is allergic to escitalopram and procaine.  MEDICATIONS:  Current Outpatient Medications  Medication Sig Dispense Refill   ALPRAZolam (XANAX) 0.25 MG tablet Take 0.25 mg by mouth daily as needed for anxiety.     Ascorbic Acid (VITAMIN C PO) Take 1 tablet by mouth daily.     aspirin 81 MG tablet Take 81 mg by mouth daily.     b complex vitamins capsule Take 1 capsule by mouth 2 (two) times a week.     calcium carbonate (TUMS - DOSED IN MG ELEMENTAL CALCIUM) 500 MG chewable tablet Chew 500-1,000 mg by mouth daily as needed for indigestion or heartburn.     folic acid (FOLVITE) 1 MG tablet TAKE 1 TABLET(1 MG) BY MOUTH DAILY 30 tablet 1   hydrochlorothiazide (HYDRODIURIL) 25 MG tablet Take 1 tablet (25 mg total) by mouth every morning. 90 tablet 1   HYDROcodone bit-homatropine (HYCODAN) 5-1.5 MG/5ML syrup Take 5 mLs by mouth every 6 hours as needed for cough. 120 mL 0   levothyroxine (SYNTHROID) 50 MCG tablet Take 1 tablet (50 mcg total) by mouth daily before breakfast. 30 tablet 1   lidocaine-prilocaine (EMLA) cream Apply to the Port-A-Cath site 30-60-minute before chemotherapy. 30 g 0   metoprolol succinate (TOPROL-XL) 50 MG 24 hr tablet Take 1 tablet (50 mg total) by mouth daily. 90 tablet 3   Multiple Minerals-Vitamins (CALCIUM-MAGNESIUM-ZINC-D3 PO) Take 1 tablet by mouth 2 (two) times a week.     Multiple Vitamin (MULTIVITAMIN) tablet Take 1  tablet by mouth 3 (three) times a week.     oxyCODONE-acetaminophen (PERCOCET/ROXICET) 5-325 MG tablet TAKE 1 TABLET BY MOUTH EVERY 6 HOURS AS NEEDED FOR SEVERE PAIN. 30 tablet 0   Polyvinyl Alcohol-Povidone (REFRESH OP) Place 1 drop into both eyes daily as needed (dry eyes).     potassium chloride SA (KLOR-CON) 20 MEQ tablet Take 1 tablet (20 mEq total) by mouth daily. 7 tablet 0   prochlorperazine (COMPAZINE) 10 MG tablet TAKE 1 TABLET(10 MG) BY MOUTH EVERY 6 HOURS AS NEEDED FOR NAUSEA OR VOMITING 30 tablet 0   simvastatin (ZOCOR) 20 MG tablet Take 1 tablet (20 mg total) by mouth every evening. 90 tablet 1   VITAMIN D PO Take 1 capsule by mouth 3 (three) times a week.     VITAMIN E PO Take 1 capsule by mouth 2 (two) times a week.     No current facility-administered medications for this visit.   Facility-Administered Medications Ordered in Other Visits  Medication Dose Route Frequency Provider Last Rate Last Admin  heparin lock flush 100 unit/mL  500 Units Intracatheter Once PRN Curt Bears, MD       pembrolizumab [facility]) 200 mg in sodium chloride 0.9 % 50 mL chemo infusion  200 mg Intravenous Once Curt Bears, MD 116 mL/hr at 09/01/20 1103 200 mg at 09/01/20 1103   PEMEtrexed (ALIMTA) 1,000 mg in sodium chloride 0.9 % 100 mL chemo infusion  500 mg/m2 (Treatment Plan Recorded) Intravenous Once Curt Bears, MD       sodium chloride flush (NS) 0.9 % injection 10 mL  10 mL Intracatheter PRN Curt Bears, MD        SURGICAL HISTORY:  Past Surgical History:  Procedure Laterality Date   DENTAL SURGERY  2012 - 2013   implants   IR IMAGING GUIDED PORT INSERTION  08/27/2020   IR THORACENTESIS ASP PLEURAL SPACE W/IMG GUIDE  12/19/2019   WISDOM TOOTH EXTRACTION  20    REVIEW OF SYSTEMS:   Review of Systems  Constitutional: Positive for appetite change, fatigue. Negative for chills, fever and unexpected weight change.  HENT: Negative for mouth sores, nosebleeds, sore  throat and trouble swallowing.   Eyes: Negative for eye problems and icterus.  Respiratory: Positive cough and shortness of breath. Negative for hemoptysis, and wheezing.   Cardiovascular: Negative for chest pain and leg swelling.  Gastrointestinal: Positive constipation and diarrhea. Negative for abdominal pain, nausea and vomiting.  Genitourinary: Negative for bladder incontinence, difficulty urinating, dysuria, frequency and hematuria.   Musculoskeletal: Negative for back pain, gait problem, neck pain and neck stiffness.  Skin:Positive for itching and rash.  Neurological: Negative for dizziness, extremity weakness, gait problem, headaches, light-headedness and seizures.  Hematological: Negative for adenopathy. Does not bruise/bleed easily.  Psychiatric/Behavioral: Positive feelings of depression. Negative for confusion, and sleep disturbance. The patient is not nervous/anxious.     PHYSICAL EXAMINATION:  Blood pressure (!) 146/67, pulse 61, temperature (!) 97.3 F (36.3 C), temperature source Tympanic, resp. rate 18, height $RemoveBe'5\' 4"'ZuShTXglE$  (1.626 m), weight 178 lb 14.4 oz (81.1 kg), last menstrual period 09/29/2002, SpO2 99 %.  ECOG PERFORMANCE STATUS: 1  Physical Exam  Constitutional: Oriented to person, place, and time and well-developed, well-nourished, and in no distress. HENT:  Head: Normocephalic and atraumatic.  Mouth/Throat: Oropharynx is clear and moist. No oropharyngeal exudate.  Eyes: Conjunctivae are normal. Right eye exhibits no discharge. Left eye exhibits no discharge. No scleral icterus.  Neck: Normal range of motion. Neck supple.  Cardiovascular: Normal rate, regular rhythm, normal heart sounds and intact distal pulses.   Pulmonary/Chest: Effort normal and breath sounds normal. No respiratory distress. No wheezes. No rales.  Abdominal: Soft. Bowel sounds are normal. Exhibits no distension and no mass. There is no tenderness.  Musculoskeletal: Normal range of motion. Exhibits  some bilateral non-pitting edema.  Lymphadenopathy: No cervical adenopathy.  Neurological: Alert and oriented to person, place, and time. Exhibits normal muscle tone. Gait normal. Coordination normal.  Skin: Skin is warm and dry. Mild erythema noted on left lower extremity lateral side. Smooth area without swelling or drainage. Not diaphoretic. No pallor.  Psychiatric: depressed mood, flat affect. memory and judgment normal.  Vitals reviewed.  LABORATORY DATA: Lab Results  Component Value Date   WBC 4.0 09/01/2020   HGB 10.9 (L) 09/01/2020   HCT 32.0 (L) 09/01/2020   MCV 96.7 09/01/2020   PLT 306 09/01/2020      Chemistry      Component Value Date/Time   NA 137 09/01/2020 0906   K 3.7 09/01/2020  0906   CL 101 09/01/2020 0906   CO2 28 09/01/2020 0906   BUN 10 09/01/2020 0906   CREATININE 0.68 09/01/2020 0906      Component Value Date/Time   CALCIUM 8.8 (L) 09/01/2020 0906   ALKPHOS 80 09/01/2020 0906   AST 34 09/01/2020 0906   ALT 28 09/01/2020 0906   BILITOT 0.5 09/01/2020 0906       RADIOGRAPHIC STUDIES:  IR IMAGING GUIDED PORT INSERTION  Result Date: 08/27/2020 INDICATION: 75 year old with stage IV lung cancer. Patient has poor venous access and needs a Port-A-Cath for treatment. EXAM: FLUOROSCOPIC AND ULTRASOUND GUIDED PLACEMENT OF A SUBCUTANEOUS PORT COMPARISON:  None. MEDICATIONS: Moderate sedation ANESTHESIA/SEDATION: Versed 4.0 mg IV; Fentanyl 100 mcg IV; Moderate Sedation Time:  26 minutes The patient was continuously monitored during the procedure by the interventional radiology nurse under my direct supervision. FLUOROSCOPY TIME:  12 seconds, 2 mGy COMPLICATIONS: None immediate. PROCEDURE: The procedure, risks, benefits, and alternatives were explained to the patient. Questions regarding the procedure were encouraged and answered. The patient understands and consents to the procedure. Patient was placed supine on the interventional table. Ultrasound confirmed a  patent right internal jugular vein. Ultrasound image was saved for documentation. The right chest and neck were cleaned with a skin antiseptic and a sterile drape was placed. Maximal barrier sterile technique was utilized including caps, mask, sterile gowns, sterile gloves, sterile drape, hand hygiene and skin antiseptic. The right neck was anesthetized with 1% lidocaine. Small incision was made in the right neck with a blade. Micropuncture set was placed in the right internal jugular vein with ultrasound guidance. The micropuncture wire was used for measurement purposes. The right chest was anesthetized with 1% lidocaine with epinephrine. #15 blade was used to make an incision and a subcutaneous port pocket was formed. [place] was assembled. Subcutaneous tunnel was formed with a stiff tunneling device. The port catheter was brought through the subcutaneous tunnel. The port was placed in the subcutaneous pocket. The micropuncture set was exchanged for a peel-away sheath. The catheter was placed through the peel-away sheath and the tip was positioned at the superior cavoatrial junction. Catheter placement was confirmed with fluoroscopy. The port was accessed and flushed with heparinized saline. The port pocket was closed using two layers of absorbable sutures and Dermabond. The vein skin site was closed using a single layer of absorbable suture and Dermabond. Sterile dressings were applied. Patient tolerated the procedure well without an immediate complication. Ultrasound and fluoroscopic images were taken and saved for this procedure. IMPRESSION: Placement of a subcutaneous port device. Catheter tip at the superior cavoatrial junction. Electronically Signed   By: Markus Daft M.D.   On: 08/27/2020 12:35     ASSESSMENT/PLAN:  This is a very pleasant 75 year old Caucasian female diagnosed with stage IV (T1b, N2, M1 a) non-small cell lung cancer, favoring adenocarcinoma.  She has no actionable mutations  based on her molecular studies by [facility].  She had a CT-guided biopsy of one of the left lung pleural-based nodules in which the final pathology was consistent with metastatic adenocarcinoma.  Molecular studies by [facility] showed positive for K-ras G 12 C mutation which is an option for targeted treatment in the second line setting with Lumakras (Sotorasib).  The patient is currently undergoing palliative systemic chemotherapy with carboplatin for an AUC of 5, Alimta 500 mg per metered squared, Keytruda 200 mg IV every 3 weeks.  She is status post 8 cycles.  Starting from  cycle #5, the patient will be on maintenance treatment with Alimta and Keytruda IV every 3 weeks.  Labs are reviewed.  Recommend that she proceed with cycle #9 today scheduled.  I will arrange for restaging CT scan of the chest, abdomen, and pelvis prior to starting her next cycle of treatment in 3 weeks. We will re-evaluate her shortness of breath and cough at that time and assess for treatment response.   We will see her back for follow-up visit in 3 weeks for evaluation to review her scan results before starting cycle #10.  Regarding the patient's healed rash/wound, she was advised to be cautious with any excessive scratching as this can lead to skin breakdown and opportunity for infection. The area does not appear infected. No swelling, tenderness, discharge, or warmth.   She was advised to apply hydrocortisone cream for isolated areas as well as small doses of benadryl to help with more diffuse itching. She was advised to not drive after taking benadryl as it may cause drowsiness. If this is not helpful, discussed we can send in a prescription for atarax.   It was discussed with the patient she should be having a bowel movement every day or at least every other day, and she can take her miralax more often if needed to help with this issue.     The patient was encouraged to discuss her feelings of depression and  history of depression with her PCP at her upcoming appointment. I will reach out to our Chaplin here at the cancer center to make her aware and see if she can follow up with the patient sometime in the future. If not started on any medication from her PCP or referred to psychiatry, we would consider Remeron to help with the patient's depression as well as decreased appetite.   We will continue to monitor her thyroid closely and make a dose adjustment for her Synthroid as needed.  The patient was advised to call immediately if she has any concerning symptoms in the interval. The patient voices understanding of current disease status and treatment options and is in agreement with the current care plan. All questions were answered. The patient knows to call the clinic with any problems, questions or concerns. We can certainly see the patient much sooner if necessary      Orders Placed This Encounter  Procedures   CT Chest W Contrast    Standing Status:   Future    Standing Expiration Date:   09/01/2021    Order Specific Question:   If indicated for the ordered procedure, I authorize the administration of contrast media per Radiology protocol    Answer:   Yes    Order Specific Question:   Preferred imaging location?    Answer:   [facility]   CT Abdomen Pelvis W Contrast    Standing Status:   Future    Standing Expiration Date:   09/01/2021    Order Specific Question:   If indicated for the ordered procedure, I authorize the administration of contrast media per Radiology protocol    Answer:   Yes    Order Specific Question:   Preferred imaging location?    Answer:   [facility]    Order Specific Question:   Is Oral Contrast requested for this exam?    Answer:   Yes, Per Radiology protocol     The total time spent in the appointment was 20-29 minutes  Yolanda Huffstetler L Ellison Rieth, PA-C 09/01/20

## 2020-09-01 ENCOUNTER — Other Ambulatory Visit: Payer: Self-pay

## 2020-09-01 ENCOUNTER — Inpatient Hospital Stay: Payer: Medicare PPO

## 2020-09-01 ENCOUNTER — Encounter: Payer: Self-pay | Admitting: General Practice

## 2020-09-01 ENCOUNTER — Inpatient Hospital Stay: Payer: Medicare PPO | Attending: Internal Medicine | Admitting: Physician Assistant

## 2020-09-01 VITALS — BP 146/67 | HR 61 | Temp 97.3°F | Resp 18 | Ht 64.0 in | Wt 178.9 lb

## 2020-09-01 DIAGNOSIS — C3491 Malignant neoplasm of unspecified part of right bronchus or lung: Secondary | ICD-10-CM | POA: Insufficient documentation

## 2020-09-01 DIAGNOSIS — C3492 Malignant neoplasm of unspecified part of left bronchus or lung: Secondary | ICD-10-CM

## 2020-09-01 DIAGNOSIS — Z5112 Encounter for antineoplastic immunotherapy: Secondary | ICD-10-CM | POA: Diagnosis not present

## 2020-09-01 DIAGNOSIS — Z79899 Other long term (current) drug therapy: Secondary | ICD-10-CM | POA: Insufficient documentation

## 2020-09-01 DIAGNOSIS — Z5111 Encounter for antineoplastic chemotherapy: Secondary | ICD-10-CM | POA: Diagnosis not present

## 2020-09-01 DIAGNOSIS — Z95828 Presence of other vascular implants and grafts: Secondary | ICD-10-CM

## 2020-09-01 LAB — CBC WITH DIFFERENTIAL (CANCER CENTER ONLY)
Abs Immature Granulocytes: 0.01 10*3/uL (ref 0.00–0.07)
Basophils Absolute: 0 10*3/uL (ref 0.0–0.1)
Basophils Relative: 1 %
Eosinophils Absolute: 0.1 10*3/uL (ref 0.0–0.5)
Eosinophils Relative: 1 %
HCT: 32 % — ABNORMAL LOW (ref 36.0–46.0)
Hemoglobin: 10.9 g/dL — ABNORMAL LOW (ref 12.0–15.0)
Immature Granulocytes: 0 %
Lymphocytes Relative: 21 %
Lymphs Abs: 0.8 10*3/uL (ref 0.7–4.0)
MCH: 32.9 pg (ref 26.0–34.0)
MCHC: 34.1 g/dL (ref 30.0–36.0)
MCV: 96.7 fL (ref 80.0–100.0)
Monocytes Absolute: 0.6 10*3/uL (ref 0.1–1.0)
Monocytes Relative: 15 %
Neutro Abs: 2.5 10*3/uL (ref 1.7–7.7)
Neutrophils Relative %: 62 %
Platelet Count: 306 10*3/uL (ref 150–400)
RBC: 3.31 MIL/uL — ABNORMAL LOW (ref 3.87–5.11)
RDW: 13.1 % (ref 11.5–15.5)
WBC Count: 4 10*3/uL (ref 4.0–10.5)
nRBC: 0 % (ref 0.0–0.2)

## 2020-09-01 LAB — CMP (CANCER CENTER ONLY)
ALT: 28 U/L (ref 0–44)
AST: 34 U/L (ref 15–41)
Albumin: 3.3 g/dL — ABNORMAL LOW (ref 3.5–5.0)
Alkaline Phosphatase: 80 U/L (ref 38–126)
Anion gap: 8 (ref 5–15)
BUN: 10 mg/dL (ref 8–23)
CO2: 28 mmol/L (ref 22–32)
Calcium: 8.8 mg/dL — ABNORMAL LOW (ref 8.9–10.3)
Chloride: 101 mmol/L (ref 98–111)
Creatinine: 0.68 mg/dL (ref 0.44–1.00)
GFR, Estimated: >60 mL/min (ref >60)
Glucose, Bld: 94 mg/dL (ref 70–99)
Potassium: 3.7 mmol/L (ref 3.5–5.1)
Sodium: 137 mmol/L (ref 135–145)
Total Bilirubin: 0.5 mg/dL (ref 0.3–1.2)
Total Protein: 7 g/dL (ref 6.5–8.1)

## 2020-09-01 LAB — TSH: TSH: 12.96 u[IU]/mL — ABNORMAL HIGH (ref 0.308–3.960)

## 2020-09-01 MED ORDER — CYANOCOBALAMIN 1000 MCG/ML IJ SOLN
INTRAMUSCULAR | Status: AC
  Filled 2020-09-01: qty 1

## 2020-09-01 MED ORDER — PROCHLORPERAZINE MALEATE 10 MG PO TABS
10.0000 mg | ORAL_TABLET | Freq: Once | ORAL | Status: AC
  Administered 2020-09-01: 10 mg via ORAL

## 2020-09-01 MED ORDER — HEPARIN SOD (PORK) LOCK FLUSH 100 UNIT/ML IV SOLN
500.0000 [IU] | Freq: Once | INTRAVENOUS | Status: AC | PRN
  Administered 2020-09-01: 500 [IU]
  Filled 2020-09-01: qty 5

## 2020-09-01 MED ORDER — PROCHLORPERAZINE MALEATE 10 MG PO TABS
ORAL_TABLET | ORAL | Status: AC
  Filled 2020-09-01: qty 1

## 2020-09-01 MED ORDER — CYANOCOBALAMIN 1000 MCG/ML IJ SOLN
1000.0000 ug | Freq: Once | INTRAMUSCULAR | Status: AC
  Administered 2020-09-01: 1000 ug via INTRAMUSCULAR

## 2020-09-01 MED ORDER — SODIUM CHLORIDE 0.9% FLUSH
10.0000 mL | INTRAVENOUS | Status: DC | PRN
  Filled 2020-09-01: qty 10

## 2020-09-01 MED ORDER — SODIUM CHLORIDE 0.9% FLUSH
10.0000 mL | INTRAVENOUS | Status: DC | PRN
  Administered 2020-09-01: 10 mL
  Filled 2020-09-01: qty 10

## 2020-09-01 MED ORDER — SODIUM CHLORIDE 0.9 % IV SOLN
Freq: Once | INTRAVENOUS | Status: AC
  Filled 2020-09-01: qty 250

## 2020-09-01 MED ORDER — SODIUM CHLORIDE 0.9 % IV SOLN
500.0000 mg/m2 | Freq: Once | INTRAVENOUS | Status: AC
  Administered 2020-09-01: 1000 mg via INTRAVENOUS
  Filled 2020-09-01: qty 40

## 2020-09-01 MED ORDER — SODIUM CHLORIDE 0.9% FLUSH
10.0000 mL | INTRAVENOUS | Status: DC | PRN
  Administered 2020-09-01: 10 mL via INTRAVENOUS
  Filled 2020-09-01: qty 10

## 2020-09-01 MED ORDER — SODIUM CHLORIDE 0.9 % IV SOLN
200.0000 mg | Freq: Once | INTRAVENOUS | Status: AC
  Administered 2020-09-01: 200 mg via INTRAVENOUS
  Filled 2020-09-01: qty 8

## 2020-09-01 NOTE — Patient Instructions (Signed)

## 2020-09-01 NOTE — Progress Notes (Signed)
[facility] Spiritual Care Note  Referred by Cassie Heilingoetter/PA for emotional support, as [name] was feeling down today. [name] and I had met in [facility] and picked right up from there.  This is a difficult time of year for her because her husband died in the heat six years ago, so the season and the weather both stir up her grief. Her aging dog Max is a good, if demanding, support.  Provided empathic listening, emotional support, normalization of feelings, affirmation of strengths, and "pick-me-up package" (prayer shawl, cosmetic goody bag, and [place] programming information packet).  [name] plans to investigate [facility] and [name] healing arts programming for human connection, support, and meaning-making.   We plan to follow up by phone before seeing each other at her next infusion.   [place], [place], [facility] Pager [phone] Voicemail [phone]

## 2020-09-01 NOTE — Patient Instructions (Signed)
[facility] MEDICAL ONCOLOGY  Discharge Instructions: Thank you for choosing [facility] to provide your oncology and hematology care.   If you have a lab appointment with the Cancer Center, please go directly to the Cancer Center and check in at the registration area.   Wear comfortable clothing and clothing appropriate for easy access to any Portacath or PICC line.   We strive to give you quality time with your provider. You may need to reschedule your appointment if you arrive late (15 or more minutes).  Arriving late affects you and other patients whose appointments are after yours.  Also, if you miss three or more appointments without notifying the office, you may be dismissed from the clinic at the provider's discretion.      For prescription refill requests, have your pharmacy contact our office and allow 72 hours for refills to be completed.    Today you received the following chemotherapy and/or immunotherapy agents Pembrolizumab (Keytruda) and Pemetrexed (Alimta)      To help prevent nausea and vomiting after your treatment, we encourage you to take your nausea medication as directed.  BELOW ARE SYMPTOMS THAT SHOULD BE REPORTED IMMEDIATELY: *FEVER GREATER THAN 100.4 F (38 C) OR HIGHER *CHILLS OR SWEATING *NAUSEA AND VOMITING THAT IS NOT CONTROLLED WITH YOUR NAUSEA MEDICATION *UNUSUAL SHORTNESS OF BREATH *UNUSUAL BRUISING OR BLEEDING *URINARY PROBLEMS (pain or burning when urinating, or frequent urination) *BOWEL PROBLEMS (unusual diarrhea, constipation, pain near the anus) TENDERNESS IN MOUTH AND THROAT WITH OR WITHOUT PRESENCE OF ULCERS (sore throat, sores in mouth, or a toothache) UNUSUAL RASH, SWELLING OR PAIN  UNUSUAL VAGINAL DISCHARGE OR ITCHING   Items with * indicate a potential emergency and should be followed up as soon as possible or go to the Emergency Department if any problems should occur.  Please show the CHEMOTHERAPY ALERT CARD or  IMMUNOTHERAPY ALERT CARD at check-in to the Emergency Department and triage nurse.  Should you have questions after your visit or need to cancel or reschedule your appointment, please contact [facility]COL[facility]  Dept: [phone]  and follow the prompts.  Office hours are 8:00 a.m. to 4:30 p.m. Monday - Friday. Please note that voicemails left after 4:00 p.m. may not be returned until the following business day.  We are closed weekends and major holidays. You have access to a nurse at all times for urgent questions. Please call the main number to the clinic Dept: [phone] and follow the prompts.   For any non-urgent questions, you may also contact your provider using MyChart. We now offer e-Visits for anyone 18 and older to request care online for non-urgent symptoms. For details visit [email or web address].   Also download the MyChart app! Go to the app store, search "MyChart", open the app, select [facility], and log in with your MyChart username and password.  Due to Covid, a mask is required upon entering the hospital/clinic. If you do not have a mask, one will be given to you upon arrival. For doctor visits, patients may have 1 support person aged 18 or older with them. For treatment visits, patients cannot have anyone with them due to current Covid guidelines and our immunocompromised population.   

## 2020-09-08 ENCOUNTER — Other Ambulatory Visit (HOSPITAL_COMMUNITY): Payer: Self-pay

## 2020-09-09 ENCOUNTER — Other Ambulatory Visit (HOSPITAL_COMMUNITY): Payer: Self-pay

## 2020-09-09 DIAGNOSIS — I10 Essential (primary) hypertension: Secondary | ICD-10-CM | POA: Diagnosis not present

## 2020-09-09 DIAGNOSIS — E7800 Pure hypercholesterolemia, unspecified: Secondary | ICD-10-CM | POA: Diagnosis not present

## 2020-09-09 DIAGNOSIS — F322 Major depressive disorder, single episode, severe without psychotic features: Secondary | ICD-10-CM | POA: Diagnosis not present

## 2020-09-09 DIAGNOSIS — E039 Hypothyroidism, unspecified: Secondary | ICD-10-CM | POA: Diagnosis not present

## 2020-09-09 DIAGNOSIS — R7303 Prediabetes: Secondary | ICD-10-CM | POA: Diagnosis not present

## 2020-09-09 MED ORDER — SERTRALINE HCL 50 MG PO TABS
ORAL_TABLET | ORAL | 12 refills | Status: DC
  Filled 2020-09-09: qty 30, 30d supply, fill #0
  Filled 2020-10-06: qty 30, 30d supply, fill #1
  Filled 2020-11-03: qty 30, 30d supply, fill #2
  Filled 2020-12-04: qty 30, 30d supply, fill #3

## 2020-09-09 MED ORDER — LEVOTHYROXINE SODIUM 75 MCG PO TABS
ORAL_TABLET | ORAL | 12 refills | Status: DC
  Filled 2020-09-09: qty 30, 30d supply, fill #0
  Filled 2020-10-06: qty 30, 30d supply, fill #1
  Filled 2020-11-03: qty 30, 30d supply, fill #2
  Filled 2020-12-04: qty 30, 30d supply, fill #3
  Filled 2021-01-05: qty 30, 30d supply, fill #4
  Filled 2021-02-04: qty 30, 30d supply, fill #5

## 2020-09-10 ENCOUNTER — Telehealth: Payer: Self-pay | Admitting: Internal Medicine

## 2020-09-10 ENCOUNTER — Telehealth: Payer: Self-pay

## 2020-09-10 ENCOUNTER — Encounter: Payer: Self-pay | Admitting: General Practice

## 2020-09-10 ENCOUNTER — Other Ambulatory Visit (HOSPITAL_COMMUNITY): Payer: Self-pay

## 2020-09-10 NOTE — Progress Notes (Signed)
[facility] Spiritual Care Note  Reached Ms Balboni briefly by phone at an inconvenient time. She verbalized deep appreciation for Spiritual Care visits and support. We plan to follow up again tomorrow.   [place], [place], [facility] Pager [phone] Voicemail [phone]

## 2020-09-10 NOTE — Telephone Encounter (Signed)
Scheduled appointment per 07/14 sch msg. Patient is aware. 

## 2020-09-10 NOTE — Telephone Encounter (Signed)
Pt scheduled her CT scan for 09/18/20 and requests for her PF w/labs to be moved to that date.  I have sent a schedule message. I have advised the pt that a schedule message has been sent.

## 2020-09-11 ENCOUNTER — Other Ambulatory Visit (HOSPITAL_COMMUNITY): Payer: Self-pay

## 2020-09-11 ENCOUNTER — Encounter: Payer: Self-pay | Admitting: General Practice

## 2020-09-11 MED ORDER — ALPRAZOLAM 0.25 MG PO TABS
ORAL_TABLET | ORAL | 0 refills | Status: DC
  Filled 2020-09-11: qty 30, 30d supply, fill #0

## 2020-09-11 NOTE — Progress Notes (Signed)
[facility] Spiritual Care Note  Attempted pastoral follow-up call; left voicemail encouraging return call.   [place], [place], [facility] Pager [phone] Voicemail [phone]

## 2020-09-15 ENCOUNTER — Encounter: Payer: Medicare PPO | Admitting: Nutrition

## 2020-09-15 ENCOUNTER — Ambulatory Visit: Payer: Medicare PPO | Admitting: Internal Medicine

## 2020-09-15 ENCOUNTER — Other Ambulatory Visit: Payer: Medicare PPO

## 2020-09-15 ENCOUNTER — Ambulatory Visit: Payer: Medicare PPO

## 2020-09-15 NOTE — Progress Notes (Signed)
Cindy [name]  Cindy Pillar, MD [facility] [address] [place] [address]  DIAGNOSIS: Stage IV (T1b, N1, M1 a) non-small cell lung cancer favoring adenocarcinoma diagnosed in October 2021.  The pathologic immunohistochemical stains suggestive of upper GI primary but there is no finding on the PET scan or imaging study to support this possibility.   Biomarker Findings Microsatellite status - MS-Stable Tumor Mutational Burden - 3 Muts/Mb Genomic Findings For a complete list of the genes assayed, please refer to the Appendix. KRAS G12C STK11 E145* 7 Disease relevant genes with no reportable alterations: ALK, BRAF, EGFR, ERBB2, MET, RET, ROS1   PDL1 Expression 0%  PRIOR THERAPY: None  CURRENT THERAPY: Systemic chemotherapy with carboplatin for AUC of 5, Alimta 500 mg/M2 and Keytruda 200 mg IV every 3 weeks.  First dose March 11, 2019.  Status post 9 cycles.  Starting from cycle #5 the patient will be on maintenance treatment with Alimta and Keytruda every 3 weeks.  INTERVAL HISTORY: Cindy [name] 74 y.o. female returns to the clinic today for a follow-up visit.  The patient is feeling overall well today without any concerning complaints.  She recently saw her PCP for depression who prescribed Zoloft.  She is struggled with depression for most of her life. She is followed by the spiritual support team and she is considering joining the support groups. She reports fatigue and decreased appetite for approximately 1 week or so after treatment.  Otherwise, she feels fair for the other 2 weeks after treatment.  She recently had a Port-A-Cath which "helped a lot".  Today she denies any fever, chills, or night sweats in the interval since her last appointment.  She is scheduled to see a member of the nutritionist team for her decreased appetite, particularly a week after treatment.  She lost approximately 2 pounds since her last appointment.  She  reports she continues to have labored breathing on exertion and a cough that "comes and goes".  She reports that sometimes it is productive and other times it is nonproductive.  Sometimes she has some chest discomfort with coughing.  She has been following with her eye doctor for some age-related vision changes.  She also has some watery eyes which her eye doctor told her may be secondary to dry eye, although this could be from her Alimta as well.  She denies any nausea or vomiting.  She denies any recent diarrhea or constipation.  The contrast from her scan helped her usual baseline constipation.  She denies any rashes or skin changes.  At her last appointment she was having significant generalized itching which has resolved at this time.  The patient recently had a restaging CT scan performed.  She is here today for evaluation to review her scan results before starting cycle #10.    MEDICAL HISTORY: Past Medical History:  Diagnosis Date   Anxiety    Depression    Hyperlipidemia    Hypertension     ALLERGIES:  is allergic to escitalopram and procaine.  MEDICATIONS:  Current Outpatient Medications  Medication Sig Dispense Refill   ALPRAZolam (XANAX) 0.25 MG tablet Take 0.25 mg by mouth daily as needed for anxiety.     ALPRAZolam (XANAX) 0.25 MG tablet Take 1 tablet by mouth once a day as needed 30 tablet 0   Ascorbic Acid (VITAMIN C PO) Take 1 tablet by mouth daily.     aspirin 81 MG tablet Take 81 mg by mouth daily.  b complex vitamins capsule Take 1 capsule by mouth 2 (two) times a week.     calcium carbonate (TUMS - DOSED IN MG ELEMENTAL CALCIUM) 500 MG chewable tablet Chew 500-1,000 mg by mouth daily as needed for indigestion or heartburn.     folic acid (FOLVITE) 1 MG tablet TAKE 1 TABLET(1 MG) BY MOUTH DAILY 30 tablet 1   hydrochlorothiazide (HYDRODIURIL) 25 MG tablet Take 1 tablet (25 mg total) by mouth every morning. 90 tablet 1   HYDROcodone bit-homatropine (HYCODAN) 5-1.5 MG/5ML  syrup Take 5 mLs by mouth every 6 hours as needed for cough. 120 mL 0   levothyroxine (SYNTHROID) 75 MCG tablet Take 1 tablet by mouth on an empty stomach once daily in the morning 30 tablet 12   lidocaine-prilocaine (EMLA) cream Apply to the Port-A-Cath site 30-60-minute before chemotherapy. 30 g 0   metoprolol succinate (TOPROL-XL) 50 MG 24 hr tablet Take 1 tablet (50 mg total) by mouth daily. 90 tablet 3   Multiple Minerals-Vitamins (CALCIUM-MAGNESIUM-ZINC-D3 PO) Take 1 tablet by mouth 2 (two) times a week.     Multiple Vitamin (MULTIVITAMIN) tablet Take 1 tablet by mouth 3 (three) times a week.     oxyCODONE-acetaminophen (PERCOCET/ROXICET) 5-325 MG tablet TAKE 1 TABLET BY MOUTH EVERY 6 HOURS AS NEEDED FOR SEVERE PAIN. 30 tablet 0   Polyvinyl Alcohol-Povidone (REFRESH OP) Place 1 drop into both eyes daily as needed (dry eyes).     potassium chloride SA (KLOR-CON) 20 MEQ tablet Take 1 tablet (20 mEq total) by mouth daily. 7 tablet 0   prochlorperazine (COMPAZINE) 10 MG tablet TAKE 1 TABLET(10 MG) BY MOUTH EVERY 6 HOURS AS NEEDED FOR NAUSEA OR VOMITING 30 tablet 0   sertraline (ZOLOFT) 50 MG tablet Take 1 tablet by mouth Once a day 30 tablet 12   simvastatin (ZOCOR) 20 MG tablet Take 1 tablet (20 mg total) by mouth every evening. 90 tablet 1   VITAMIN D PO Take 1 capsule by mouth 3 (three) times a week.     VITAMIN E PO Take 1 capsule by mouth 2 (two) times a week.     No current facility-administered medications for this visit.    SURGICAL HISTORY:  Past Surgical History:  Procedure Laterality Date   DENTAL SURGERY  2012 - 2013   implants   IR IMAGING GUIDED PORT INSERTION  08/27/2020   IR THORACENTESIS ASP PLEURAL SPACE W/IMG GUIDE  12/19/2019   WISDOM TOOTH EXTRACTION  20    REVIEW OF SYSTEMS:   Review of Systems  Constitutional: Positive for appetite change and fatigue. Negative for chills, fever and unexpected weight change. HENT: Negative for mouth sores, nosebleeds, sore throat  and trouble swallowing.   Eyes: Negative for eye problems and icterus. Respiratory: Positive cough and shortness of breath (stable). Negative for hemoptysis, and wheezing.   Cardiovascular: Negative for chest pain and leg swelling. Gastrointestinal: Negative for abdominal pain, constipation, nausea and vomiting. Genitourinary: Negative for bladder incontinence, difficulty urinating, dysuria, frequency and hematuria.   Musculoskeletal: Negative for back pain, gait problem, neck pain and neck stiffness. Skin: Negative for itching and rash. Neurological: Negative for dizziness, extremity weakness, gait problem, headaches, light-headedness and seizures. Hematological: Negative for adenopathy. Does not bruise/bleed easily. Psychiatric/Behavioral: Positive feelings of depression. Negative for confusion, and sleep disturbance. The patient is not nervous/anxious.   PHYSICAL EXAMINATION:  Blood pressure (!) 127/53, pulse 63, temperature 98.3 F (36.8 C), temperature source Oral, resp. rate 17, height $RemoveBe'5\' 4"'MuKgasWye$  (1.626 m), weight  176 lb 6.4 oz (80 kg), last menstrual period 09/29/2002, SpO2 98 %.  ECOG PERFORMANCE STATUS: 1  Physical Exam  Constitutional: Oriented to person, place, and time and well-developed, well-nourished, and in no distress. HENT: Head: Normocephalic and atraumatic. Mouth/Throat: Oropharynx is clear and moist. No oropharyngeal exudate. Eyes: Conjunctivae are normal. Right eye exhibits no discharge. Left eye exhibits no discharge. No scleral icterus. Neck: Normal range of motion. Neck supple. Cardiovascular: Normal rate, regular rhythm, normal heart sounds and intact distal pulses.   Pulmonary/Chest: Effort normal and breath sounds normal. No respiratory distress. No wheezes. No rales. Abdominal: Soft. Bowel sounds are normal. Exhibits no distension and no mass. There is no tenderness.  Musculoskeletal: Normal range of motion. Exhibits some bilateral non-pitting edema.   Lymphadenopathy: No cervical adenopathy.  Neurological: Alert and oriented to person, place, and time. Exhibits normal muscle tone. Gait normal. Coordination normal. Skin: Skin is warm and dry. Not diaphoretic. No pallor. Psychiatric: Mood, memory and judgment normal.  Vitals reviewed.  LABORATORY DATA: Lab Results  Component Value Date   WBC 6.0 09/18/2020   HGB 10.8 (L) 09/18/2020   HCT 31.5 (L) 09/18/2020   MCV 96.3 09/18/2020   PLT 348 09/18/2020      Chemistry      Component Value Date/Time   NA 138 09/18/2020 1316   K 3.7 09/18/2020 1316   CL 99 09/18/2020 1316   CO2 28 09/18/2020 1316   BUN 11 09/18/2020 1316   CREATININE 0.69 09/18/2020 1316      Component Value Date/Time   CALCIUM 9.0 09/18/2020 1316   ALKPHOS 87 09/18/2020 1316   AST 37 09/18/2020 1316   ALT 27 09/18/2020 1316   BILITOT 0.4 09/18/2020 1316       RADIOGRAPHIC STUDIES:  CT Chest W Contrast  Result Date: 09/21/2020 CLINICAL DATA:  Restaging left lung cancer, ongoing chemotherapy and immunotherapy. Chronic cough with weight loss and shortness of breath. EXAM: CT CHEST, ABDOMEN, AND PELVIS WITH CONTRAST TECHNIQUE: Multidetector CT imaging of the chest, abdomen and pelvis was performed following the standard protocol during bolus administration of intravenous contrast. CONTRAST:  30mL OMNIPAQUE IOHEXOL 350 MG/ML SOLN COMPARISON:  Multiple exams, including 07/13/2020 FINDINGS: CT CHEST FINDINGS Cardiovascular: Right Port-A-Cath tip: Right atrium. Aortic and branch vessel atherosclerotic vascular disease with mild to moderate cardiomegaly. Mediastinum/Nodes: Right upper paratracheal node 0.8 cm in short axis on image 17 series 2, formerly 0.9 cm by my measurements. Lungs/Pleura: Thick pleural rind on the left with associated pleural nodularity. Anterior pleural nodularity on image 20 series 2 measures 0.7 cm in thickness, stable. Posterior pleural nodularity on image 26 series 2 measures 1.7 cm in  thickness, stable. Morphologically the pleural rind appears similar to prior. Left infrahilar nodule adjacent to the pleural thickening measures about 1.8 by 1.6 cm on image 54 series 4, previously 1.7 by 1.6, essentially stable. This could be a true pulmonary nodule or from adjacent round atelectasis. Thickened major fissure on the left. Septal thickening in the left upper lobe and left lower lobe peripherally with increased coarsening down at the lung base likely from lymphangitic tumor spread, no change from prior. 2 mm right apical subpleural nodule on the right, image 16 series 4. Several other small subpleural nodules on the right are not appreciably changed. Minimal increasing conspicuity of a 2 mm subpleural nodule anteriorly along the right upper lobe on image 74 of series 4 although this could simply be from slice selection rather than necessarily being from growth  given the small size of the lesion. Musculoskeletal: Stable sclerotic metastatic lesions including the sternum and in the T1, T2, T11, and T12 vertebral bodies. Faint sclerotic lesions in the bilateral scapula and ribs likewise are probably metastatic. Stable sclerotic metastatic lesion in the left medial clavicle. CT ABDOMEN PELVIS FINDINGS Hepatobiliary: Unremarkable Pancreas: Unremarkable Spleen: Unremarkable Adrenals/Urinary Tract: Unremarkable Stomach/Bowel: Prominent stool throughout the colon favors constipation. Vascular/Lymphatic: Aortoiliac atherosclerotic vascular disease. Reproductive: Unremarkable Other: No supplemental non-categorized findings. Musculoskeletal: Scattered osseous metastatic disease in the bony pelvis and lumbar spine is mostly stable compared to prior. A 0.5 cm small sclerotic lesion in the left ischium on image 115 of series 2 is slightly more conspicuous than the prior exam but was probably present previously as well. IMPRESSION: 1. Stable appearance of the thick pleural rind on the left with adjacent peripheral  interstitial accentuation becoming more coarse at the lung bases, indicating pleural metastatic disease and lymphangitic spread. 2. Scattered sclerotic osseous metastatic lesions are stable from prior, although a 0.5 cm small sclerotic lesion in the left ischium is slightly more conspicuous than on the prior exam (but was probably present previously as well). 3. Several small subpleural nodules on the right are not appreciably changed and are nonspecific. A single 2 mm right upper lobe anterior subpleural nodule is slightly more noticeable but this may be from slice selection given the small size of this lesion. 4. Other imaging findings of potential clinical significance: Aortic Atherosclerosis (ICD10-I70.0). Mild to moderate cardiomegaly. Prominent stool throughout the colon favors constipation. Electronically Signed   By: Van Clines M.D.   On: 09/21/2020 09:49   CT Abdomen Pelvis W Contrast  Result Date: 09/21/2020 CLINICAL DATA:  Restaging left lung cancer, ongoing chemotherapy and immunotherapy. Chronic cough with weight loss and shortness of breath. EXAM: CT CHEST, ABDOMEN, AND PELVIS WITH CONTRAST TECHNIQUE: Multidetector CT imaging of the chest, abdomen and pelvis was performed following the standard protocol during bolus administration of intravenous contrast. CONTRAST:  83mL OMNIPAQUE IOHEXOL 350 MG/ML SOLN COMPARISON:  Multiple exams, including 07/13/2020 FINDINGS: CT CHEST FINDINGS Cardiovascular: Right Port-A-Cath tip: Right atrium. Aortic and branch vessel atherosclerotic vascular disease with mild to moderate cardiomegaly. Mediastinum/Nodes: Right upper paratracheal node 0.8 cm in short axis on image 17 series 2, formerly 0.9 cm by my measurements. Lungs/Pleura: Thick pleural rind on the left with associated pleural nodularity. Anterior pleural nodularity on image 20 series 2 measures 0.7 cm in thickness, stable. Posterior pleural nodularity on image 26 series 2 measures 1.7 cm in  thickness, stable. Morphologically the pleural rind appears similar to prior. Left infrahilar nodule adjacent to the pleural thickening measures about 1.8 by 1.6 cm on image 54 series 4, previously 1.7 by 1.6, essentially stable. This could be a true pulmonary nodule or from adjacent round atelectasis. Thickened major fissure on the left. Septal thickening in the left upper lobe and left lower lobe peripherally with increased coarsening down at the lung base likely from lymphangitic tumor spread, no change from prior. 2 mm right apical subpleural nodule on the right, image 16 series 4. Several other small subpleural nodules on the right are not appreciably changed. Minimal increasing conspicuity of a 2 mm subpleural nodule anteriorly along the right upper lobe on image 74 of series 4 although this could simply be from slice selection rather than necessarily being from growth given the small size of the lesion. Musculoskeletal: Stable sclerotic metastatic lesions including the sternum and in the T1, T2, T11, and T12 vertebral bodies.  Faint sclerotic lesions in the bilateral scapula and ribs likewise are probably metastatic. Stable sclerotic metastatic lesion in the left medial clavicle. CT ABDOMEN PELVIS FINDINGS Hepatobiliary: Unremarkable Pancreas: Unremarkable Spleen: Unremarkable Adrenals/Urinary Tract: Unremarkable Stomach/Bowel: Prominent stool throughout the colon favors constipation. Vascular/Lymphatic: Aortoiliac atherosclerotic vascular disease. Reproductive: Unremarkable Other: No supplemental non-categorized findings. Musculoskeletal: Scattered osseous metastatic disease in the bony pelvis and lumbar spine is mostly stable compared to prior. A 0.5 cm small sclerotic lesion in the left ischium on image 115 of series 2 is slightly more conspicuous than the prior exam but was probably present previously as well. IMPRESSION: 1. Stable appearance of the thick pleural rind on the left with adjacent peripheral  interstitial accentuation becoming more coarse at the lung bases, indicating pleural metastatic disease and lymphangitic spread. 2. Scattered sclerotic osseous metastatic lesions are stable from prior, although a 0.5 cm small sclerotic lesion in the left ischium is slightly more conspicuous than on the prior exam (but was probably present previously as well). 3. Several small subpleural nodules on the right are not appreciably changed and are nonspecific. A single 2 mm right upper lobe anterior subpleural nodule is slightly more noticeable but this may be from slice selection given the small size of this lesion. 4. Other imaging findings of potential clinical significance: Aortic Atherosclerosis (ICD10-I70.0). Mild to moderate cardiomegaly. Prominent stool throughout the colon favors constipation. Electronically Signed   By: Van Clines M.D.   On: 09/21/2020 09:49   IR IMAGING GUIDED PORT INSERTION  Result Date: 08/27/2020 INDICATION: 75 year old with stage IV lung cancer. Patient has poor venous access and needs a Port-A-Cath for treatment. EXAM: FLUOROSCOPIC AND ULTRASOUND GUIDED PLACEMENT OF A SUBCUTANEOUS PORT COMPARISON:  None. MEDICATIONS: Moderate sedation ANESTHESIA/SEDATION: Versed 4.0 mg IV; Fentanyl 100 mcg IV; Moderate Sedation Time:  26 minutes The patient was continuously monitored during the procedure by the interventional radiology nurse under my direct supervision. FLUOROSCOPY TIME:  12 seconds, 2 mGy COMPLICATIONS: None immediate. PROCEDURE: The procedure, risks, benefits, and alternatives were explained to the patient. Questions regarding the procedure were encouraged and answered. The patient understands and consents to the procedure. Patient was placed supine on the interventional table. Ultrasound confirmed a patent right internal jugular vein. Ultrasound image was saved for documentation. The right chest and neck were cleaned with a skin antiseptic and a sterile drape was placed.  Maximal barrier sterile technique was utilized including caps, mask, sterile gowns, sterile gloves, sterile drape, hand hygiene and skin antiseptic. The right neck was anesthetized with 1% lidocaine. Small incision was made in the right neck with a blade. Micropuncture set was placed in the right internal jugular vein with ultrasound guidance. The micropuncture wire was used for measurement purposes. The right chest was anesthetized with 1% lidocaine with epinephrine. #15 blade was used to make an incision and a subcutaneous port pocket was formed. [place] was assembled. Subcutaneous tunnel was formed with a stiff tunneling device. The port catheter was brought through the subcutaneous tunnel. The port was placed in the subcutaneous pocket. The micropuncture set was exchanged for a peel-away sheath. The catheter was placed through the peel-away sheath and the tip was positioned at the superior cavoatrial junction. Catheter placement was confirmed with fluoroscopy. The port was accessed and flushed with heparinized saline. The port pocket was closed using two layers of absorbable sutures and Dermabond. The vein skin site was closed using a single layer of absorbable suture and Dermabond. Sterile dressings were applied. Patient tolerated  the procedure well without an immediate complication. Ultrasound and fluoroscopic images were taken and saved for this procedure. IMPRESSION: Placement of a subcutaneous port device. Catheter tip at the superior cavoatrial junction. Electronically Signed   By: Markus Daft M.D.   On: 08/27/2020 12:35     ASSESSMENT/PLAN:  This is a very pleasant 75 year old Caucasian female diagnosed with stage IV (T1b, N2, M1 a) non-small cell lung cancer, favoring adenocarcinoma.  She has no actionable mutations based on her molecular studies by [facility].  She had a CT-guided biopsy of one of the left lung pleural-based nodules in which the final pathology was consistent with  metastatic adenocarcinoma.  Molecular studies by [facility] showed positive for K-ras G 12 C mutation which is an option for targeted treatment in the second line setting with Lumakras (Sotorasib).   The patient is currently undergoing palliative systemic chemotherapy with carboplatin for an AUC of 5, Alimta 500 mg per metered squared, Keytruda 200 mg IV every 3 weeks.  She is status post 9 cycles.  Starting from cycle #5, the patient will be on maintenance treatment with Alimta and Keytruda IV every 3 weeks.  The patient recently had a restaging CT scan performed.  Dr. Julien Nordmann personally and independently reviewed the scan discussed results with the patient today.  The scan shows stable disease.  Dr. Julien Nordmann recommends that she proceed on the same treatment of the same dose.  She will proceed with cycle #10 today scheduled.  She will meet with the member the nutritionist team today regarding her decreased appetite and weight loss.  She will continue to be followed by her PCP for her depression.  She also was previously seen by the spiritual support team and discussed resources with them.  Discussed that her watery eyes may be secondary to Alimta in which the treatment would be the same for dry eyes.  Encouraged her to use refresh tears.  The patient was advised to call immediately if she has any concerning symptoms in the interval. The patient voices understanding of current disease status and treatment options and is in agreement with the current care plan. All questions were answered. The patient knows to call the clinic with any problems, questions or concerns. We can certainly see the patient much sooner if necessary.  No orders of the defined types were placed in this encounter.   Bennette Hasty L Drea Jurewicz, PA-C 09/22/20  ADDENDUM: Hematology/Oncology Attending: I had a face-to-face encounter with the patient today.  I reviewed her record, labs and scans and recommended her care  plan.  This is a very pleasant 75 years old white female diagnosed with a stage IV (T1b, N1, M1 a) non-small cell lung cancer favoring adenocarcinoma in October 2021 with KRAS G12C mutation and PD-L1 expression of 0%.  The patient started induction systemic chemotherapy with carboplatin, Alimta and Keytruda for 4 cycles and she is currently on maintenance treatment with Alimta and Keytruda every 3 weeks status post total treatment of 9 cycles.  The patient has been tolerating her maintenance treatment fairly well with no concerning adverse effect except for mild fatigue. She is currently on Zoloft for depression by her primary care physician. She had repeat CT scan of the chest, abdomen pelvis performed recently.  I personally and independently reviewed the scan and discussed the results with the patient today. Her scan showed no concerning findings for disease progression and in general has stable disease. I recommended for the patient to continue her current treatment with  the same maintenance treatment and she will proceed with cycle #10 today. I will see her back for follow-up visit in 3 weeks for evaluation before the next cycle of her treatment. She was advised to call immediately if she has any other concerning symptoms in the interval. The total time spent in the appointment was 30 minutes. Disclaimer: This [name] was dictated with voice recognition software. Similar sounding words can inadvertently be transcribed and may be missed upon review.  Eilleen Kempf, MD 09/22/20

## 2020-09-18 ENCOUNTER — Encounter: Payer: Self-pay | Admitting: General Practice

## 2020-09-18 ENCOUNTER — Ambulatory Visit (HOSPITAL_COMMUNITY)
Admission: RE | Admit: 2020-09-18 | Discharge: 2020-09-18 | Disposition: A | Discharge status: Home / Self Care | Payer: Medicare PPO | Source: Ambulatory Visit | Attending: Physician Assistant | Admitting: Physician Assistant

## 2020-09-18 ENCOUNTER — Inpatient Hospital Stay: Payer: Medicare PPO

## 2020-09-18 ENCOUNTER — Other Ambulatory Visit: Payer: Self-pay

## 2020-09-18 DIAGNOSIS — Z95828 Presence of other vascular implants and grafts: Secondary | ICD-10-CM

## 2020-09-18 DIAGNOSIS — R053 Chronic cough: Secondary | ICD-10-CM | POA: Diagnosis not present

## 2020-09-18 DIAGNOSIS — Z5112 Encounter for antineoplastic immunotherapy: Secondary | ICD-10-CM | POA: Diagnosis not present

## 2020-09-18 DIAGNOSIS — R634 Abnormal weight loss: Secondary | ICD-10-CM | POA: Diagnosis not present

## 2020-09-18 DIAGNOSIS — C7951 Secondary malignant neoplasm of bone: Secondary | ICD-10-CM | POA: Diagnosis not present

## 2020-09-18 DIAGNOSIS — Z5111 Encounter for antineoplastic chemotherapy: Secondary | ICD-10-CM | POA: Diagnosis not present

## 2020-09-18 DIAGNOSIS — C3492 Malignant neoplasm of unspecified part of left bronchus or lung: Secondary | ICD-10-CM

## 2020-09-18 DIAGNOSIS — C3491 Malignant neoplasm of unspecified part of right bronchus or lung: Secondary | ICD-10-CM | POA: Diagnosis not present

## 2020-09-18 DIAGNOSIS — C3490 Malignant neoplasm of unspecified part of unspecified bronchus or lung: Secondary | ICD-10-CM | POA: Diagnosis not present

## 2020-09-18 DIAGNOSIS — Z79899 Other long term (current) drug therapy: Secondary | ICD-10-CM | POA: Diagnosis not present

## 2020-09-18 DIAGNOSIS — R0602 Shortness of breath: Secondary | ICD-10-CM | POA: Diagnosis not present

## 2020-09-18 DIAGNOSIS — R911 Solitary pulmonary nodule: Secondary | ICD-10-CM | POA: Diagnosis not present

## 2020-09-18 DIAGNOSIS — I700 Atherosclerosis of aorta: Secondary | ICD-10-CM | POA: Diagnosis not present

## 2020-09-18 LAB — CMP (CANCER CENTER ONLY)
ALT: 27 U/L (ref 0–44)
AST: 37 U/L (ref 15–41)
Albumin: 3.4 g/dL — ABNORMAL LOW (ref 3.5–5.0)
Alkaline Phosphatase: 87 U/L (ref 38–126)
Anion gap: 11 (ref 5–15)
BUN: 11 mg/dL (ref 8–23)
CO2: 28 mmol/L (ref 22–32)
Calcium: 9 mg/dL (ref 8.9–10.3)
Chloride: 99 mmol/L (ref 98–111)
Creatinine: 0.69 mg/dL (ref 0.44–1.00)
GFR, Estimated: >60 mL/min (ref >60)
Glucose, Bld: 88 mg/dL (ref 70–99)
Potassium: 3.7 mmol/L (ref 3.5–5.1)
Sodium: 138 mmol/L (ref 135–145)
Total Bilirubin: 0.4 mg/dL (ref 0.3–1.2)
Total Protein: 7.4 g/dL (ref 6.5–8.1)

## 2020-09-18 LAB — CBC WITH DIFFERENTIAL (CANCER CENTER ONLY)
Abs Immature Granulocytes: 0.02 10*3/uL (ref 0.00–0.07)
Basophils Absolute: 0 10*3/uL (ref 0.0–0.1)
Basophils Relative: 0 %
Eosinophils Absolute: 0 10*3/uL (ref 0.0–0.5)
Eosinophils Relative: 1 %
HCT: 31.5 % — ABNORMAL LOW (ref 36.0–46.0)
Hemoglobin: 10.8 g/dL — ABNORMAL LOW (ref 12.0–15.0)
Immature Granulocytes: 0 %
Lymphocytes Relative: 20 %
Lymphs Abs: 1.2 10*3/uL (ref 0.7–4.0)
MCH: 33 pg (ref 26.0–34.0)
MCHC: 34.3 g/dL (ref 30.0–36.0)
MCV: 96.3 fL (ref 80.0–100.0)
Monocytes Absolute: 0.8 10*3/uL (ref 0.1–1.0)
Monocytes Relative: 14 %
Neutro Abs: 3.9 10*3/uL (ref 1.7–7.7)
Neutrophils Relative %: 65 %
Platelet Count: 348 10*3/uL (ref 150–400)
RBC: 3.27 MIL/uL — ABNORMAL LOW (ref 3.87–5.11)
RDW: 13.7 % (ref 11.5–15.5)
WBC Count: 6 10*3/uL (ref 4.0–10.5)
nRBC: 0 % (ref 0.0–0.2)

## 2020-09-18 MED ORDER — SODIUM CHLORIDE 0.9% FLUSH
10.0000 mL | INTRAVENOUS | Status: AC | PRN
  Administered 2020-09-18: 10 mL
  Filled 2020-09-18: qty 10

## 2020-09-18 MED ORDER — IOHEXOL 350 MG/ML SOLN
85.0000 mL | Freq: Once | INTRAVENOUS | Status: AC | PRN
  Administered 2020-09-18: 85 mL via INTRAVENOUS

## 2020-09-18 MED ORDER — HEPARIN SOD (PORK) LOCK FLUSH 100 UNIT/ML IV SOLN
500.0000 [IU] | Freq: Once | INTRAVENOUS | Status: AC
Start: 2020-09-18 — End: 2020-09-18
  Administered 2020-09-18: 500 [IU] via INTRAVENOUS

## 2020-09-18 NOTE — Progress Notes (Signed)
[facility] Note  Visited with Cindy [name] in the [place] lobby while she was preparing for her CT. She identifies some nervousness about results, but thinks she'll be able to manage her distress fairly well before she finds out on Tuesday. We plan to follow up in infusion on Tuesday afternoon for further support.   [place], [place], [facility] Pager [phone] Voicemail [phone]

## 2020-09-22 ENCOUNTER — Other Ambulatory Visit: Payer: Medicare PPO

## 2020-09-22 ENCOUNTER — Inpatient Hospital Stay: Payer: Medicare PPO

## 2020-09-22 ENCOUNTER — Other Ambulatory Visit: Payer: Self-pay

## 2020-09-22 ENCOUNTER — Inpatient Hospital Stay: Payer: Medicare PPO | Admitting: Nutrition

## 2020-09-22 ENCOUNTER — Encounter: Payer: Self-pay | Admitting: General Practice

## 2020-09-22 ENCOUNTER — Inpatient Hospital Stay: Payer: Medicare PPO | Admitting: Physician Assistant

## 2020-09-22 VITALS — BP 127/53 | HR 63 | Temp 98.3°F | Resp 17 | Ht 64.0 in | Wt 176.4 lb

## 2020-09-22 DIAGNOSIS — Z79899 Other long term (current) drug therapy: Secondary | ICD-10-CM | POA: Diagnosis not present

## 2020-09-22 DIAGNOSIS — Z5112 Encounter for antineoplastic immunotherapy: Secondary | ICD-10-CM | POA: Diagnosis not present

## 2020-09-22 DIAGNOSIS — Z5111 Encounter for antineoplastic chemotherapy: Secondary | ICD-10-CM | POA: Diagnosis not present

## 2020-09-22 DIAGNOSIS — C3491 Malignant neoplasm of unspecified part of right bronchus or lung: Secondary | ICD-10-CM | POA: Diagnosis not present

## 2020-09-22 DIAGNOSIS — C3492 Malignant neoplasm of unspecified part of left bronchus or lung: Secondary | ICD-10-CM

## 2020-09-22 MED ORDER — PROCHLORPERAZINE MALEATE 10 MG PO TABS
10.0000 mg | ORAL_TABLET | Freq: Once | ORAL | Status: AC
  Administered 2020-09-22: 10 mg via ORAL

## 2020-09-22 MED ORDER — PROCHLORPERAZINE MALEATE 10 MG PO TABS
ORAL_TABLET | ORAL | Status: AC
  Filled 2020-09-22: qty 1

## 2020-09-22 MED ORDER — SODIUM CHLORIDE 0.9 % IV SOLN
500.0000 mg/m2 | Freq: Once | INTRAVENOUS | Status: AC
  Administered 2020-09-22: 1000 mg via INTRAVENOUS
  Filled 2020-09-22: qty 40

## 2020-09-22 MED ORDER — SODIUM CHLORIDE 0.9 % IV SOLN
Freq: Once | INTRAVENOUS | Status: AC
  Filled 2020-09-22: qty 250

## 2020-09-22 MED ORDER — HEPARIN SOD (PORK) LOCK FLUSH 100 UNIT/ML IV SOLN
500.0000 [IU] | Freq: Once | INTRAVENOUS | Status: AC | PRN
  Administered 2020-09-22: 500 [IU]
  Filled 2020-09-22: qty 5

## 2020-09-22 MED ORDER — SODIUM CHLORIDE 0.9 % IV SOLN
200.0000 mg | Freq: Once | INTRAVENOUS | Status: AC
  Administered 2020-09-22: 200 mg via INTRAVENOUS
  Filled 2020-09-22: qty 8

## 2020-09-22 MED ORDER — SODIUM CHLORIDE 0.9% FLUSH
10.0000 mL | INTRAVENOUS | Status: DC | PRN
  Administered 2020-09-22: 10 mL
  Filled 2020-09-22: qty 10

## 2020-09-22 NOTE — Progress Notes (Signed)
[facility] Spiritual Care Note  Followed up with Cindy [name] in infusion, providing pastoral listening and emotional support as she processed the tension between the relief at stability of CT results and hearing the news of a friend who has been proclaimed cancer-free. The vulnerability is difficult to navigate, especially in the seemingly unending series of treatments. The affection of her dog Max and simple joys to look forward to, such as a friend's annual tomato sandwich gathering, are helping to focus on meaning-making.  We plan to follow up at her next treatment, and Cindy Hoopingarner knows how to reach [facility] in the meantime as needed/desired.   [place], [place], [facility] Pager [phone] Voicemail [phone]

## 2020-09-22 NOTE — Progress Notes (Signed)
Nutrition follow-up completed with patient during infusion for lung cancer. Weight decreased and documented as 176.4 pounds July 26. Labs were reviewed. Patient reports increased fatigue and decreased appetite after treatments. She denies nausea, vomiting, constipation, and diarrhea.  Nutrition diet diagnosis: Unintended weight loss continues.  Intervention: Educated on importance of continuing small frequent meals and snacks and reviewed appropriate ways to add calories and protein throughout the day. Educated on strategies for improving appetite. Encouraged oral nutrition supplements between meals as needed. Nutrition facts sheets provided.  Questions were answered.  Teach back method used.  Monitoring, evaluation, goals: Patient will tolerate adequate calories and protein to minimize further weight loss.  Next visit: Monday, August 15 during infusion.  **Disclaimer: This note was dictated with voice recognition software. Similar sounding words can inadvertently be transcribed and this note may contain transcription errors which may not have been corrected upon publication of note.**

## 2020-09-22 NOTE — Progress Notes (Signed)
Per Cassie Heilingoetter, PA-C use labs from 09/18/2020 for treatment today.

## 2020-09-22 NOTE — Patient Instructions (Signed)
[facility]   Discharge Instructions: Thank you for choosing [facility] to provide your oncology and hematology care.   If you have a lab appointment with the [facility], please go directly to the [facility] and check in at the registration area.   Wear comfortable clothing and clothing appropriate for easy access to any Portacath or PICC line.   We strive to give you quality time with your provider. You may need to reschedule your appointment if you arrive late (15 or more minutes).  Arriving late affects you and other patients whose appointments are after yours.  Also, if you miss three or more appointments without notifying the office, you may be dismissed from the clinic at the provider's discretion.      For prescription refill requests, have your pharmacy contact our office and allow 72 hours for refills to be completed.    Today you received the following chemotherapy and/or immunotherapy agents: pemetrexed and pembrolizumab.       To help prevent nausea and vomiting after your treatment, we encourage you to take your nausea medication as directed.  BELOW ARE SYMPTOMS THAT SHOULD BE REPORTED IMMEDIATELY: *FEVER GREATER THAN 100.4 F (38 C) OR HIGHER *CHILLS OR SWEATING *NAUSEA AND VOMITING THAT IS NOT CONTROLLED WITH YOUR NAUSEA MEDICATION *UNUSUAL SHORTNESS OF BREATH *UNUSUAL BRUISING OR BLEEDING *URINARY PROBLEMS (pain or burning when urinating, or frequent urination) *BOWEL PROBLEMS (unusual diarrhea, constipation, pain near the anus) TENDERNESS IN MOUTH AND THROAT WITH OR WITHOUT PRESENCE OF ULCERS (sore throat, sores in mouth, or a toothache) UNUSUAL RASH, SWELLING OR PAIN  UNUSUAL VAGINAL DISCHARGE OR ITCHING   Items with * indicate a potential emergency and should be followed up as soon as possible or go to the Emergency Department if any problems should occur.  Please show the CHEMOTHERAPY ALERT CARD or IMMUNOTHERAPY  ALERT CARD at check-in to the Emergency Department and triage nurse.  Should you have questions after your visit or need to cancel or reschedule your appointment, please contact [facility]  Dept: [phone]  and follow the prompts.  Office hours are 8:00 a.m. to 4:30 p.m. Monday - Friday. Please note that voicemails left after 4:00 p.m. may not be returned until the following business day.  We are closed weekends and major holidays. You have access to a nurse at all times for urgent questions. Please call the main number to the clinic Dept: [phone] and follow the prompts.   For any non-urgent questions, you may also contact your provider using MyChart. We now offer e-Visits for anyone 68 and older to request care online for non-urgent symptoms. For details visit [email or web address].   Also download the MyChart app! Go to the app store, search "MyChart", open the app, select [facility], and log in with your MyChart username and password.  Due to Covid, a mask is required upon entering the hospital/clinic. If you do not have a mask, one will be given to you upon arrival. For doctor visits, patients may have 1 support person aged 42 or older with them. For treatment visits, patients cannot have anyone with them due to current Covid guidelines and our immunocompromised population.

## 2020-09-23 DIAGNOSIS — F322 Major depressive disorder, single episode, severe without psychotic features: Secondary | ICD-10-CM | POA: Diagnosis not present

## 2020-10-06 ENCOUNTER — Other Ambulatory Visit (HOSPITAL_COMMUNITY): Payer: Self-pay

## 2020-10-12 ENCOUNTER — Inpatient Hospital Stay: Payer: Medicare PPO | Admitting: Nutrition

## 2020-10-12 ENCOUNTER — Inpatient Hospital Stay: Payer: Medicare PPO | Attending: Internal Medicine | Admitting: Internal Medicine

## 2020-10-12 ENCOUNTER — Other Ambulatory Visit: Payer: Self-pay

## 2020-10-12 ENCOUNTER — Inpatient Hospital Stay: Payer: Medicare PPO

## 2020-10-12 ENCOUNTER — Encounter: Payer: Self-pay | Admitting: Internal Medicine

## 2020-10-12 VITALS — BP 139/71 | HR 74 | Temp 97.8°F | Resp 20 | Ht 64.0 in | Wt 173.8 lb

## 2020-10-12 DIAGNOSIS — C3491 Malignant neoplasm of unspecified part of right bronchus or lung: Secondary | ICD-10-CM | POA: Diagnosis not present

## 2020-10-12 DIAGNOSIS — C3492 Malignant neoplasm of unspecified part of left bronchus or lung: Secondary | ICD-10-CM

## 2020-10-12 DIAGNOSIS — Z5111 Encounter for antineoplastic chemotherapy: Secondary | ICD-10-CM | POA: Insufficient documentation

## 2020-10-12 DIAGNOSIS — Z5112 Encounter for antineoplastic immunotherapy: Secondary | ICD-10-CM | POA: Insufficient documentation

## 2020-10-12 DIAGNOSIS — Z79899 Other long term (current) drug therapy: Secondary | ICD-10-CM | POA: Insufficient documentation

## 2020-10-12 LAB — CBC WITH DIFFERENTIAL (CANCER CENTER ONLY)
Abs Immature Granulocytes: 0.01 10*3/uL (ref 0.00–0.07)
Basophils Absolute: 0 10*3/uL (ref 0.0–0.1)
Basophils Relative: 1 %
Eosinophils Absolute: 0 10*3/uL (ref 0.0–0.5)
Eosinophils Relative: 1 %
HCT: 31.1 % — ABNORMAL LOW (ref 36.0–46.0)
Hemoglobin: 10.7 g/dL — ABNORMAL LOW (ref 12.0–15.0)
Immature Granulocytes: 0 %
Lymphocytes Relative: 15 %
Lymphs Abs: 0.8 10*3/uL (ref 0.7–4.0)
MCH: 33.3 pg (ref 26.0–34.0)
MCHC: 34.4 g/dL (ref 30.0–36.0)
MCV: 96.9 fL (ref 80.0–100.0)
Monocytes Absolute: 0.6 10*3/uL (ref 0.1–1.0)
Monocytes Relative: 11 %
Neutro Abs: 3.9 10*3/uL (ref 1.7–7.7)
Neutrophils Relative %: 72 %
Platelet Count: 340 10*3/uL (ref 150–400)
RBC: 3.21 MIL/uL — ABNORMAL LOW (ref 3.87–5.11)
RDW: 14.3 % (ref 11.5–15.5)
WBC Count: 5.4 10*3/uL (ref 4.0–10.5)
nRBC: 0 % (ref 0.0–0.2)

## 2020-10-12 LAB — CMP (CANCER CENTER ONLY)
ALT: 26 U/L (ref 0–44)
AST: 31 U/L (ref 15–41)
Albumin: 3.1 g/dL — ABNORMAL LOW (ref 3.5–5.0)
Alkaline Phosphatase: 101 U/L (ref 38–126)
Anion gap: 11 (ref 5–15)
BUN: 8 mg/dL (ref 8–23)
CO2: 27 mmol/L (ref 22–32)
Calcium: 9 mg/dL (ref 8.9–10.3)
Chloride: 102 mmol/L (ref 98–111)
Creatinine: 0.68 mg/dL (ref 0.44–1.00)
GFR, Estimated: >60 mL/min (ref >60)
Glucose, Bld: 103 mg/dL — ABNORMAL HIGH (ref 70–99)
Potassium: 3.4 mmol/L — ABNORMAL LOW (ref 3.5–5.1)
Sodium: 140 mmol/L (ref 135–145)
Total Bilirubin: 0.4 mg/dL (ref 0.3–1.2)
Total Protein: 6.9 g/dL (ref 6.5–8.1)

## 2020-10-12 LAB — TSH: TSH: 4.587 u[IU]/mL — ABNORMAL HIGH (ref 0.308–3.960)

## 2020-10-12 MED ORDER — PROCHLORPERAZINE MALEATE 10 MG PO TABS
ORAL_TABLET | ORAL | Status: AC
  Filled 2020-10-12: qty 1

## 2020-10-12 MED ORDER — HEPARIN SOD (PORK) LOCK FLUSH 100 UNIT/ML IV SOLN
500.0000 [IU] | Freq: Once | INTRAVENOUS | Status: AC | PRN
Start: 2020-10-12 — End: 2020-10-12
  Administered 2020-10-12: 500 [IU]

## 2020-10-12 MED ORDER — SODIUM CHLORIDE 0.9% FLUSH
10.0000 mL | INTRAVENOUS | Status: DC | PRN
  Administered 2020-10-12: 10 mL

## 2020-10-12 MED ORDER — SODIUM CHLORIDE 0.9 % IV SOLN
500.0000 mg/m2 | Freq: Once | INTRAVENOUS | Status: AC
  Administered 2020-10-12: 1000 mg via INTRAVENOUS
  Filled 2020-10-12: qty 40

## 2020-10-12 MED ORDER — SODIUM CHLORIDE 0.9 % IV SOLN: Freq: Once | INTRAVENOUS | Status: AC

## 2020-10-12 MED ORDER — PROCHLORPERAZINE MALEATE 10 MG PO TABS
10.0000 mg | ORAL_TABLET | Freq: Once | ORAL | Status: AC
  Administered 2020-10-12: 10 mg via ORAL

## 2020-10-12 MED ORDER — SODIUM CHLORIDE 0.9 % IV SOLN
200.0000 mg | Freq: Once | INTRAVENOUS | Status: AC
  Administered 2020-10-12: 200 mg via INTRAVENOUS
  Filled 2020-10-12: qty 8

## 2020-10-12 NOTE — Progress Notes (Signed)
Nutrition follow-up completed with patient during infusion for lung cancer. Weight documented as 173.8 pounds August 15 decreased from 176.4 pounds July 26. Labs noted: Potassium 3.4, glucose 103, and albumin 3.1. Patient reports nausea with diarrhea after last treatment.  States she took Compazine and it resolved. She is starting to get out of the house and visit friends.  Nutrition diagnosis: Unintended weight loss continues.  Intervention: Recommended she continue small amounts of food frequently throughout the day. Added calories as tolerated. Continue oral nutrition supplements.  Recommended she try fairlife nutrition plan.  Monitoring, evaluation, goals: Patient will tolerate adequate calories and protein to minimize further weight loss.  Next visit: Tuesday, September 6 during infusion.  **Disclaimer: This note was dictated with voice recognition software. Similar sounding words can inadvertently be transcribed and this note may contain transcription errors which may not have been corrected upon publication of note.**

## 2020-10-12 NOTE — Progress Notes (Signed)
[facility] [phone]   Fax:([phone]  OFFICE PROGRESS NOTE  [name], MD [address] [place] [address]  DIAGNOSIS: stage IV (T1b, N1, M1 a) non-small cell lung cancer favoring adenocarcinoma diagnosed in October 2021.  The pathologic immunohistochemical stains suggestive of upper GI primary but there is no finding on the PET scan or imaging study to support this possibility.  Biomarker Findings Microsatellite status - MS-Stable Tumor Mutational Burden - 3 Muts/Mb Genomic Findings For a complete list of the genes assayed, please refer to the Appendix. KRAS G12C STK11 E145* 7 Disease relevant genes with no reportable alterations: ALK, BRAF, EGFR, ERBB2, MET, RET, ROS1  PDL1 Expression 0%  PRIOR THERAPY: None  CURRENT THERAPY: Systemic chemotherapy with carboplatin for AUC of 5, Alimta 500 mg/M2 and Keytruda 200 mg IV every 3 weeks.  First dose March 11, 2019.  Status post 10 cycles.  Starting from cycle #5 the patient will be on maintenance treatment with Alimta and Keytruda every 3 weeks.  INTERVAL HISTORY: Cindy [name] 75 y.o. female returns to the clinic today for follow-up visit.  The patient is feeling fine today with no concerning complaints.  She denied having any chest pain, shortness of breath, cough or hemoptysis.  She had 1 episodes of nausea recently improved with Compazine.  She has no vomiting, diarrhea or constipation.  She has no skin rash.  She denied having any significant weight loss or night sweats.  She continues to tolerate her treatment with Alimta and Keytruda fairly well.  The patient is here today for evaluation before starting cycle #11 of her treatment.   MEDICAL HISTORY: Past Medical History:  Diagnosis Date   Anxiety    Depression    Hyperlipidemia    Hypertension     ALLERGIES:  is allergic to escitalopram and procaine.  MEDICATIONS:  Current Outpatient Medications   Medication Sig Dispense Refill   ALPRAZolam (XANAX) 0.25 MG tablet Take 0.25 mg by mouth daily as needed for anxiety.     ALPRAZolam (XANAX) 0.25 MG tablet Take 1 tablet by mouth once a day as needed 30 tablet 0   Ascorbic Acid (VITAMIN C PO) Take 1 tablet by mouth daily.     aspirin 81 MG tablet Take 81 mg by mouth daily.     b complex vitamins capsule Take 1 capsule by mouth 2 (two) times a week.     calcium carbonate (TUMS - DOSED IN MG ELEMENTAL CALCIUM) 500 MG chewable tablet Chew 500-1,000 mg by mouth daily as needed for indigestion or heartburn.     folic acid (FOLVITE) 1 MG tablet TAKE 1 TABLET(1 MG) BY MOUTH DAILY 30 tablet 1   hydrochlorothiazide (HYDRODIURIL) 25 MG tablet Take 1 tablet (25 mg total) by mouth every morning. 90 tablet 1   HYDROcodone bit-homatropine (HYCODAN) 5-1.5 MG/5ML syrup Take 5 mLs by mouth every 6 hours as needed for cough. 120 mL 0   levothyroxine (SYNTHROID) 75 MCG tablet Take 1 tablet by mouth on an empty stomach once daily in the morning 30 tablet 12   lidocaine-prilocaine (EMLA) cream Apply to the Port-A-Cath site 30-60-minute before chemotherapy. 30 g 0   metoprolol succinate (TOPROL-XL) 50 MG 24 hr tablet Take 1 tablet (50 mg total) by mouth daily. 90 tablet 3   Multiple Minerals-Vitamins (CALCIUM-MAGNESIUM-ZINC-D3 PO) Take 1 tablet by mouth 2 (two) times a week.     Multiple Vitamin (MULTIVITAMIN) tablet Take 1 tablet  by mouth 3 (three) times a week.     oxyCODONE-acetaminophen (PERCOCET/ROXICET) 5-325 MG tablet TAKE 1 TABLET BY MOUTH EVERY 6 HOURS AS NEEDED FOR SEVERE PAIN. 30 tablet 0   Polyvinyl Alcohol-Povidone (REFRESH OP) Place 1 drop into both eyes daily as needed (dry eyes).     potassium chloride SA (KLOR-CON) 20 MEQ tablet Take 1 tablet (20 mEq total) by mouth daily. 7 tablet 0   prochlorperazine (COMPAZINE) 10 MG tablet TAKE 1 TABLET(10 MG) BY MOUTH EVERY 6 HOURS AS NEEDED FOR NAUSEA OR VOMITING 30 tablet 0   sertraline (ZOLOFT) 50 MG tablet  Take 1 tablet by mouth Once a day 30 tablet 12   simvastatin (ZOCOR) 20 MG tablet Take 1 tablet (20 mg total) by mouth every evening. 90 tablet 1   VITAMIN D PO Take 1 capsule by mouth 3 (three) times a week.     VITAMIN E PO Take 1 capsule by mouth 2 (two) times a week.     No current facility-administered medications for this visit.    SURGICAL HISTORY:  Past Surgical History:  Procedure Laterality Date   DENTAL SURGERY  2012 - 2013   implants   IR IMAGING GUIDED PORT INSERTION  08/27/2020   IR THORACENTESIS ASP PLEURAL SPACE W/IMG GUIDE  12/19/2019   WISDOM TOOTH EXTRACTION  20    REVIEW OF SYSTEMS:  A comprehensive review of systems was negative except for: Eyes: positive for visual disturbance Respiratory: positive for pleurisy/chest pain   PHYSICAL EXAMINATION: General appearance: alert, cooperative, and no distress Head: Normocephalic, without obvious abnormality, atraumatic Neck: no adenopathy, no JVD, supple, symmetrical, trachea midline, and thyroid not enlarged, symmetric, no tenderness/mass/nodules Lymph nodes: Cervical, supraclavicular, and axillary nodes normal. Resp: clear to auscultation bilaterally Back: symmetric, no curvature. ROM normal. No CVA tenderness. Cardio: regular rate and rhythm, S1, S2 normal, no murmur, click, rub or gallop GI: soft, non-tender; bowel sounds normal; no masses,  no organomegaly Extremities: extremities normal, atraumatic, no cyanosis or edema  ECOG PERFORMANCE STATUS: 1 - Symptomatic but completely ambulatory  Blood pressure 139/71, pulse 74, temperature 97.8 F (36.6 C), temperature source Tympanic, resp. rate 20, height $RemoveBe'5\' 4"'uMehZnxUz$  (1.626 m), weight 173 lb 12.8 oz (78.8 kg), last menstrual period 09/29/2002, SpO2 98 %.  LABORATORY DATA: Lab Results  Component Value Date   WBC 6.0 09/18/2020   HGB 10.8 (L) 09/18/2020   HCT 31.5 (L) 09/18/2020   MCV 96.3 09/18/2020   PLT 348 09/18/2020      Chemistry      Component Value  Date/Time   NA 138 09/18/2020 1316   K 3.7 09/18/2020 1316   CL 99 09/18/2020 1316   CO2 28 09/18/2020 1316   BUN 11 09/18/2020 1316   CREATININE 0.69 09/18/2020 1316      Component Value Date/Time   CALCIUM 9.0 09/18/2020 1316   ALKPHOS 87 09/18/2020 1316   AST 37 09/18/2020 1316   ALT 27 09/18/2020 1316   BILITOT 0.4 09/18/2020 1316       RADIOGRAPHIC STUDIES: CT Chest W Contrast  Result Date: 09/21/2020 CLINICAL DATA:  Restaging left lung cancer, ongoing chemotherapy and immunotherapy. Chronic cough with weight loss and shortness of breath. EXAM: CT CHEST, ABDOMEN, AND PELVIS WITH CONTRAST TECHNIQUE: Multidetector CT imaging of the chest, abdomen and pelvis was performed following the standard protocol during bolus administration of intravenous contrast. CONTRAST:  48mL OMNIPAQUE IOHEXOL 350 MG/ML SOLN COMPARISON:  Multiple exams, including 07/13/2020 FINDINGS: CT CHEST FINDINGS Cardiovascular:  Right Port-A-Cath tip: Right atrium. Aortic and branch vessel atherosclerotic vascular disease with mild to moderate cardiomegaly. Mediastinum/Nodes: Right upper paratracheal node 0.8 cm in short axis on image 17 series 2, formerly 0.9 cm by my measurements. Lungs/Pleura: Thick pleural rind on the left with associated pleural nodularity. Anterior pleural nodularity on image 20 series 2 measures 0.7 cm in thickness, stable. Posterior pleural nodularity on image 26 series 2 measures 1.7 cm in thickness, stable. Morphologically the pleural rind appears similar to prior. Left infrahilar nodule adjacent to the pleural thickening measures about 1.8 by 1.6 cm on image 54 series 4, previously 1.7 by 1.6, essentially stable. This could be a true pulmonary nodule or from adjacent round atelectasis. Thickened major fissure on the left. Septal thickening in the left upper lobe and left lower lobe peripherally with increased coarsening down at the lung base likely from lymphangitic tumor spread, no change from  prior. 2 mm right apical subpleural nodule on the right, image 16 series 4. Several other small subpleural nodules on the right are not appreciably changed. Minimal increasing conspicuity of a 2 mm subpleural nodule anteriorly along the right upper lobe on image 74 of series 4 although this could simply be from slice selection rather than necessarily being from growth given the small size of the lesion. Musculoskeletal: Stable sclerotic metastatic lesions including the sternum and in the T1, T2, T11, and T12 vertebral bodies. Faint sclerotic lesions in the bilateral scapula and ribs likewise are probably metastatic. Stable sclerotic metastatic lesion in the left medial clavicle. CT ABDOMEN PELVIS FINDINGS Hepatobiliary: Unremarkable Pancreas: Unremarkable Spleen: Unremarkable Adrenals/Urinary Tract: Unremarkable Stomach/Bowel: Prominent stool throughout the colon favors constipation. Vascular/Lymphatic: Aortoiliac atherosclerotic vascular disease. Reproductive: Unremarkable Other: No supplemental non-categorized findings. Musculoskeletal: Scattered osseous metastatic disease in the bony pelvis and lumbar spine is mostly stable compared to prior. A 0.5 cm small sclerotic lesion in the left ischium on image 115 of series 2 is slightly more conspicuous than the prior exam but was probably present previously as well. IMPRESSION: 1. Stable appearance of the thick pleural rind on the left with adjacent peripheral interstitial accentuation becoming more coarse at the lung bases, indicating pleural metastatic disease and lymphangitic spread. 2. Scattered sclerotic osseous metastatic lesions are stable from prior, although a 0.5 cm small sclerotic lesion in the left ischium is slightly more conspicuous than on the prior exam (but was probably present previously as well). 3. Several small subpleural nodules on the right are not appreciably changed and are nonspecific. A single 2 mm right upper lobe anterior subpleural nodule  is slightly more noticeable but this may be from slice selection given the small size of this lesion. 4. Other imaging findings of potential clinical significance: Aortic Atherosclerosis (ICD10-I70.0). Mild to moderate cardiomegaly. Prominent stool throughout the colon favors constipation. Electronically Signed   By: Van Clines M.D.   On: 09/21/2020 09:49   CT Abdomen Pelvis W Contrast  Result Date: 09/21/2020 CLINICAL DATA:  Restaging left lung cancer, ongoing chemotherapy and immunotherapy. Chronic cough with weight loss and shortness of breath. EXAM: CT CHEST, ABDOMEN, AND PELVIS WITH CONTRAST TECHNIQUE: Multidetector CT imaging of the chest, abdomen and pelvis was performed following the standard protocol during bolus administration of intravenous contrast. CONTRAST:  52mL OMNIPAQUE IOHEXOL 350 MG/ML SOLN COMPARISON:  Multiple exams, including 07/13/2020 FINDINGS: CT CHEST FINDINGS Cardiovascular: Right Port-A-Cath tip: Right atrium. Aortic and branch vessel atherosclerotic vascular disease with mild to moderate cardiomegaly. Mediastinum/Nodes: Right upper paratracheal node 0.8 cm in  short axis on image 17 series 2, formerly 0.9 cm by my measurements. Lungs/Pleura: Thick pleural rind on the left with associated pleural nodularity. Anterior pleural nodularity on image 20 series 2 measures 0.7 cm in thickness, stable. Posterior pleural nodularity on image 26 series 2 measures 1.7 cm in thickness, stable. Morphologically the pleural rind appears similar to prior. Left infrahilar nodule adjacent to the pleural thickening measures about 1.8 by 1.6 cm on image 54 series 4, previously 1.7 by 1.6, essentially stable. This could be a true pulmonary nodule or from adjacent round atelectasis. Thickened major fissure on the left. Septal thickening in the left upper lobe and left lower lobe peripherally with increased coarsening down at the lung base likely from lymphangitic tumor spread, no change from prior. 2  mm right apical subpleural nodule on the right, image 16 series 4. Several other small subpleural nodules on the right are not appreciably changed. Minimal increasing conspicuity of a 2 mm subpleural nodule anteriorly along the right upper lobe on image 74 of series 4 although this could simply be from slice selection rather than necessarily being from growth given the small size of the lesion. Musculoskeletal: Stable sclerotic metastatic lesions including the sternum and in the T1, T2, T11, and T12 vertebral bodies. Faint sclerotic lesions in the bilateral scapula and ribs likewise are probably metastatic. Stable sclerotic metastatic lesion in the left medial clavicle. CT ABDOMEN PELVIS FINDINGS Hepatobiliary: Unremarkable Pancreas: Unremarkable Spleen: Unremarkable Adrenals/Urinary Tract: Unremarkable Stomach/Bowel: Prominent stool throughout the colon favors constipation. Vascular/Lymphatic: Aortoiliac atherosclerotic vascular disease. Reproductive: Unremarkable Other: No supplemental non-categorized findings. Musculoskeletal: Scattered osseous metastatic disease in the bony pelvis and lumbar spine is mostly stable compared to prior. A 0.5 cm small sclerotic lesion in the left ischium on image 115 of series 2 is slightly more conspicuous than the prior exam but was probably present previously as well. IMPRESSION: 1. Stable appearance of the thick pleural rind on the left with adjacent peripheral interstitial accentuation becoming more coarse at the lung bases, indicating pleural metastatic disease and lymphangitic spread. 2. Scattered sclerotic osseous metastatic lesions are stable from prior, although a 0.5 cm small sclerotic lesion in the left ischium is slightly more conspicuous than on the prior exam (but was probably present previously as well). 3. Several small subpleural nodules on the right are not appreciably changed and are nonspecific. A single 2 mm right upper lobe anterior subpleural nodule is  slightly more noticeable but this may be from slice selection given the small size of this lesion. 4. Other imaging findings of potential clinical significance: Aortic Atherosclerosis (ICD10-I70.0). Mild to moderate cardiomegaly. Prominent stool throughout the colon favors constipation. Electronically Signed   By: Van Clines M.D.   On: 09/21/2020 09:49     ASSESSMENT AND PLAN: This is a very pleasant 75 years old white female recently diagnosed with a stage IV (T1b, N2, M1 a) non-small cell lung cancer, adenocarcinoma with no actionable mutations based on the molecular studies by [facility]. The patient underwent CT-guided core biopsy of one of the left lung pleural-based nodules by interventional radiology and the final pathology was consistent with metastatic adenocarcinoma. Her tissue block [facility] for molecular studies and PD-L1 expression.  The molecular studies by [facility] showed positive K-ras G12C mutation which will be an option for targeted therapy in the second line setting with [facility] (Sotorasib). The patient is currently undergoing palliative systemic chemotherapy with carboplatin for AUC of 5, Alimta 500 mg/M2 and  Keytruda 200 mg IV every 3 weeks.  Status post 10 cycles.  Starting from cycle #5 the patient will be on maintenance treatment with Alimta and Keytruda every 3 weeks. The patient continues to tolerate her treatment well with no concerning adverse effects. I recommended for her to proceed with cycle #11 today as planned. For the hypothyroidism, I will give her a refill of levothyroxine. For the history of depression, the patient will resume her treatment with Prozac. For pain management she will continue on Percocet on as-needed basis. She will come back for follow-up visit in 3 weeks for evaluation before the next cycle of her treatment. The patient was advised to call immediately if she has any concerning symptoms in the interval.  The patient  voices understanding of current disease status and treatment options and is in agreement with the current care plan.  All questions were answered. The patient knows to call the clinic with any problems, questions or concerns. We can certainly see the patient much sooner if necessary.  Disclaimer: This note was dictated with voice recognition software. Similar sounding words can inadvertently be transcribed and may not be corrected upon review.

## 2020-10-12 NOTE — Patient Instructions (Signed)
[facility] ONCOLOGY   Discharge Instructions: Thank you for choosing [facility] to provide your oncology and hematology care.   If you have a lab appointment with the [facility], please go directly to the [facility] and check in at the registration area.   Wear comfortable clothing and clothing appropriate for easy access to any Portacath or PICC line.   We strive to give you quality time with your provider. You may need to reschedule your appointment if you arrive late (15 or more minutes).  Arriving late affects you and other patients whose appointments are after yours.  Also, if you miss three or more appointments without notifying the office, you may be dismissed from the clinic at the provider's discretion.      For prescription refill requests, have your pharmacy contact our office and allow 72 hours for refills to be completed.    Today you received the following chemotherapy and/or immunotherapy agents: pemetrexed and pembrolizumab.       To help prevent nausea and vomiting after your treatment, we encourage you to take your nausea medication as directed.  BELOW ARE SYMPTOMS THAT SHOULD BE REPORTED IMMEDIATELY: *FEVER GREATER THAN 100.4 F (38 C) OR HIGHER *CHILLS OR SWEATING *NAUSEA AND VOMITING THAT IS NOT CONTROLLED WITH YOUR NAUSEA MEDICATION *UNUSUAL SHORTNESS OF BREATH *UNUSUAL BRUISING OR BLEEDING *URINARY PROBLEMS (pain or burning when urinating, or frequent urination) *BOWEL PROBLEMS (unusual diarrhea, constipation, pain near the anus) TENDERNESS IN MOUTH AND THROAT WITH OR WITHOUT PRESENCE OF ULCERS (sore throat, sores in mouth, or a toothache) UNUSUAL RASH, SWELLING OR PAIN  UNUSUAL VAGINAL DISCHARGE OR ITCHING   Items with * indicate a potential emergency and should be followed up as soon as possible or go to the Emergency Department if any problems should occur.  Please show the CHEMOTHERAPY ALERT CARD or IMMUNOTHERAPY  ALERT CARD at check-in to the Emergency Department and triage nurse.  Should you have questions after your visit or need to cancel or reschedule your appointment, please contact [facility]  Dept: [phone]  and follow the prompts.  Office hours are 8:00 a.m. to 4:30 p.m. Monday - Friday. Please note that voicemails left after 4:00 p.m. may not be returned until the following business day.  We are closed weekends and major holidays. You have access to a nurse at all times for urgent questions. Please call the main number to the clinic Dept: [phone] and follow the prompts.   For any non-urgent questions, you may also contact your provider using MyChart. We now offer e-Visits for anyone 52 and older to request care online for non-urgent symptoms. For details visit [email or web address].   Also download the MyChart app! Go to the app store, search "MyChart", open the app, select [facility], and log in with your MyChart username and password.  Due to Covid, a mask is required upon entering the hospital/clinic. If you do not have a mask, one will be given to you upon arrival. For doctor visits, patients may have 1 support person aged 59 or older with them. For treatment visits, patients cannot have anyone with them due to current Covid guidelines and our immunocompromised population.

## 2020-10-12 NOTE — Addendum Note (Signed)
Addended by: Ardeen Garland on: 10/12/2020 02:26 PM   Modules accepted: Orders

## 2020-10-15 ENCOUNTER — Other Ambulatory Visit (HOSPITAL_COMMUNITY): Payer: Self-pay

## 2020-10-27 DIAGNOSIS — C3490 Malignant neoplasm of unspecified part of unspecified bronchus or lung: Secondary | ICD-10-CM | POA: Diagnosis not present

## 2020-10-27 DIAGNOSIS — F339 Major depressive disorder, recurrent, unspecified: Secondary | ICD-10-CM | POA: Diagnosis not present

## 2020-10-27 DIAGNOSIS — C801 Malignant (primary) neoplasm, unspecified: Secondary | ICD-10-CM | POA: Diagnosis not present

## 2020-10-29 NOTE — Progress Notes (Signed)
[facility] OFFICE PROGRESS NOTE  [name], MD [place] [address] [place] [address]  DIAGNOSIS: Stage IV (T1b, N1, M1 a) non-small cell lung cancer favoring adenocarcinoma diagnosed in October 2021.  The pathologic immunohistochemical stains suggestive of upper GI primary but there is no finding on the PET scan or imaging study to support this possibility.   Biomarker Findings Microsatellite status - MS-Stable Tumor Mutational Burden - 3 Muts/Mb Genomic Findings For a complete list of the genes assayed, please refer to the Appendix. KRAS G12C STK11 E145* 7 Disease relevant genes with no reportable alterations: ALK, BRAF, EGFR, ERBB2, MET, RET, ROS1   PDL1 Expression 0%  PRIOR THERAPY: None  CURRENT THERAPY: Systemic chemotherapy with carboplatin for AUC of 5, Alimta 500 mg/M2 and Keytruda 200 mg IV every 3 weeks.  First dose March 11, 2019.  Status post 11 cycles.  Starting from cycle #5 the patient will be on maintenance treatment with Alimta and Keytruda every 3 weeks.  INTERVAL HISTORY: Cindy [name] 75 y.o. female returns to the clinic today for a follow-up visit.  The patient is feeling fairly well today without any concerning complaints except she had some adverse side effects in the interval since her last appointment. Sometimes if she has a coughing spell, she has some emesis. Compazine does help her nausea. She attributes her cough to if she has phlegm. Most of the time, her phlegm ins yellow or clear. She does not take mucinex because she is worried it will dry her mouth out. She does try to drink plenty of fluid.   She also had one episode of diarrhea the Saturday after treatment. She typically has trouble with constipation. She switched back to metamucil which has been helpful for her constipation.   She also had a skin rash that was pruritic along her abdomen following her last treatment. She used hydrocortisone cream and it resolved.    She denies any fever or chills. She has night sweats routinely following treatment for one night.  She has been evaluated in the past by member the nutritionist team and they are also seeing her today. The patient states that she often has an appetite but the first week after treatment she does not have the will to prepare food.  Her weight is relatively stable today.  She drinks boost every other day or so. She reports she has labored breathing with exertion that comes and goes.  She also has a baseline cough which sometimes is productive and other times nonproductive.  She denies chest pain or hemoptysis. She notes she has some positional left sided back pain. She takes tylenol which helps her pain. She denies any visual changes. She is here today for evaluation and repeat blood work before starting cycle #12.    MEDICAL HISTORY: Past Medical History:  Diagnosis Date   Anxiety    Depression    Hyperlipidemia    Hypertension     ALLERGIES:  is allergic to escitalopram and procaine.  MEDICATIONS:  Current Outpatient Medications  Medication Sig Dispense Refill   ALPRAZolam (XANAX) 0.25 MG tablet Take 0.25 mg by mouth daily as needed for anxiety.     ALPRAZolam (XANAX) 0.25 MG tablet Take 1 tablet by mouth once a day as needed 30 tablet 0   Ascorbic Acid (VITAMIN C PO) Take 1 tablet by mouth daily.     aspirin 81 MG tablet Take 81 mg by mouth daily.     b complex  vitamins capsule Take 1 capsule by mouth 2 (two) times a week.     calcium carbonate (TUMS - DOSED IN MG ELEMENTAL CALCIUM) 500 MG chewable tablet Chew 500-1,000 mg by mouth daily as needed for indigestion or heartburn.     folic acid (FOLVITE) 1 MG tablet TAKE 1 TABLET(1 MG) BY MOUTH DAILY 30 tablet 1   hydrochlorothiazide (HYDRODIURIL) 25 MG tablet Take 1 tablet (25 mg total) by mouth every morning. 90 tablet 1   HYDROcodone bit-homatropine (HYCODAN) 5-1.5 MG/5ML syrup Take 5 mLs by mouth every 6 hours as needed for cough. 120  mL 0   levothyroxine (SYNTHROID) 75 MCG tablet Take 1 tablet by mouth on an empty stomach once daily in the morning 30 tablet 12   lidocaine-prilocaine (EMLA) cream Apply to the Port-A-Cath site 30-60-minute before chemotherapy. 30 g 0   metoprolol succinate (TOPROL-XL) 50 MG 24 hr tablet Take 1 tablet (50 mg total) by mouth daily. 90 tablet 3   Multiple Minerals-Vitamins (CALCIUM-MAGNESIUM-ZINC-D3 PO) Take 1 tablet by mouth 2 (two) times a week.     Multiple Vitamin (MULTIVITAMIN) tablet Take 1 tablet by mouth 3 (three) times a week.     Polyvinyl Alcohol-Povidone (REFRESH OP) Place 1 drop into both eyes daily as needed (dry eyes).     prochlorperazine (COMPAZINE) 10 MG tablet TAKE 1 TABLET(10 MG) BY MOUTH EVERY 6 HOURS AS NEEDED FOR NAUSEA OR VOMITING 30 tablet 0   sertraline (ZOLOFT) 50 MG tablet Take 1 tablet by mouth Once a day 30 tablet 12   simvastatin (ZOCOR) 20 MG tablet Take 1 tablet (20 mg total) by mouth every evening. 90 tablet 1   VITAMIN D PO Take 1 capsule by mouth 3 (three) times a week.     VITAMIN E PO Take 1 capsule by mouth 2 (two) times a week.     No current facility-administered medications for this visit.    SURGICAL HISTORY:  Past Surgical History:  Procedure Laterality Date   DENTAL SURGERY  2012 - 2013   implants   IR IMAGING GUIDED PORT INSERTION  08/27/2020   IR THORACENTESIS ASP PLEURAL SPACE W/IMG GUIDE  12/19/2019   WISDOM TOOTH EXTRACTION  20    REVIEW OF SYSTEMS:   Review of Systems  Constitutional: Positive for fatigue and decreased appetite. Negative for chills, fever and unexpected weight change.  HENT:  Negative for mouth sores, nosebleeds, sore throat and trouble swallowing.   Eyes: Negative for eye problems and icterus.  Respiratory: Positive for cough and baseline dyspnea on exertion. Negative for hemoptysis and wheezing.   Cardiovascular: Positive for baseline lower extremity swelling bilaterally. Negative for chest pain. Gastrointestinal:  Positive for one episode of diarrhea. Positive for constipation. Positive for nausea/vomiting with excessive coughing. Negative for abdominal pain. Genitourinary: Negative for bladder incontinence, difficulty urinating, dysuria, frequency and hematuria.   Musculoskeletal: Positive for left sided back pain. Negative for gait problem, neck pain and neck stiffness.  Skin: Positive for itching/rash (resolved).  Neurological: Negative for dizziness, extremity weakness, gait problem, headaches, light-headedness and seizures.  Hematological: Negative for adenopathy. Does not bruise/bleed easily.  Psychiatric/Behavioral: Negative for confusion, depression and sleep disturbance. The patient is not nervous/anxious.     PHYSICAL EXAMINATION:  Blood pressure 133/67, pulse 74, temperature 97.8 F (36.6 C), temperature source Oral, resp. rate 17, weight 172 lb 1.6 oz (78.1 kg), last menstrual period 09/29/2002, SpO2 95 %.  ECOG PERFORMANCE STATUS: 1  Physical Exam  Constitutional: Oriented to person, place,  and time and well-developed, well-nourished, and in no distress.  HENT:  Head: Normocephalic and atraumatic.  Mouth/Throat: Oropharynx is clear and moist. No oropharyngeal exudate.  Eyes: Conjunctivae are normal. Right eye exhibits no discharge. Left eye exhibits no discharge. No scleral icterus.  Neck: Normal range of motion. Neck supple.  Cardiovascular: Normal rate, regular rhythm, normal heart sounds and intact distal pulses.   Pulmonary/Chest: Effort normal. Positive for decreased breath sounds in left lower lung field. No respiratory distress. No wheezes. No rales.  Abdominal: Soft. Bowel sounds are normal. Exhibits no distension and no mass. There is no tenderness.  Musculoskeletal: Normal range of motion. Positive for baseline bilateral lower extremity swelling.  Lymphadenopathy:    No cervical adenopathy.  Neurological: Alert and oriented to person, place, and time. Exhibits normal muscle  tone. Gait normal. Coordination normal.  Skin: Skin is warm and dry. No rash noted. Not diaphoretic. No erythema. No pallor.  Psychiatric: Mood, memory and judgment normal.  Vitals reviewed.  LABORATORY DATA: Lab Results  Component Value Date   WBC 5.7 11/03/2020   HGB 10.6 (L) 11/03/2020   HCT 31.1 (L) 11/03/2020   MCV 97.5 11/03/2020   PLT 351 11/03/2020      Chemistry      Component Value Date/Time   NA 140 10/12/2020 1001   K 3.4 (L) 10/12/2020 1001   CL 102 10/12/2020 1001   CO2 27 10/12/2020 1001   BUN 8 10/12/2020 1001   CREATININE 0.68 10/12/2020 1001      Component Value Date/Time   CALCIUM 9.0 10/12/2020 1001   ALKPHOS 101 10/12/2020 1001   AST 31 10/12/2020 1001   ALT 26 10/12/2020 1001   BILITOT 0.4 10/12/2020 1001       RADIOGRAPHIC STUDIES:  No results found.   ASSESSMENT/PLAN:  This is a very pleasant 75 year old Caucasian female diagnosed with stage IV (T1b, N2, M1 a) non-small cell lung cancer, favoring adenocarcinoma.  She has no actionable mutations based on her molecular studies by [facility].  She had a CT-guided biopsy of one of the left lung pleural-based nodules in which the final pathology was consistent with metastatic adenocarcinoma.  Molecular studies by [facility] showed positive for K-ras G 12 C mutation which is an option for targeted treatment in the second line setting with Lumakras (Sotorasib).   The patient is currently undergoing palliative systemic chemotherapy with carboplatin for an AUC of 5, Alimta 500 mg per metered squared, Keytruda 200 mg IV every 3 weeks.  She is status post 11 cycles.  Starting from cycle #5, the patient will be on maintenance treatment with Alimta and Keytruda IV every 3 weeks.   Labs were reviewed.  Recommend that she proceed with cycle #12 today scheduled.  I will arrange for restaging CT scan of the chest, abdomen, and pelvis prior to starting her next cycle of treatment so we can re-evaluate her  response to treatment. We will also evaluate her rib/back pain at that time. Her pain is controlled with tylenol and her pain is usually positional.   We will see her back for follow-up visit in 3 weeks for evaluation before starting cycle number 13 and to review her scan results.  She will see the nutritionist today. Encouraged her to utilize other programs that can deliver food if she does not have the will to prepare food. She will continue to drink ensure.   I advised her to use a cough suppressant if needed and mucinex for her  cough. For the dry mouth, she will continue to drink plenty of fluid and use biotene.   She will continue taking Zoloft for her depression which is prescribed by her primary care provider.  Her diarrhea was one episode and resolved without intervention. She moreso has issues with constipation. Will monitor for now.   She developed a rash in the interval since her last appointment which resolved at this time. If she has a recurrent rash, advised to use hydrocortisone cream for itching.   The patient was advised to call immediately if she has any concerning symptoms in the interval. The patient voices understanding of current disease status and treatment options and is in agreement with the current care plan. All questions were answered. The patient knows to call the clinic with any problems, questions or concerns. We can certainly see the patient much sooner if necessary       Orders Placed This Encounter  Procedures   CT Chest W Contrast    Standing Status:   Future    Standing Expiration Date:   11/03/2021    Order Specific Question:   If indicated for the ordered procedure, I authorize the administration of contrast media per Radiology protocol    Answer:   Yes    Order Specific Question:   Preferred imaging location?    Answer:   [facility]   CT Abdomen Pelvis W Contrast    Standing Status:   Future    Standing Expiration Date:   11/03/2021     Order Specific Question:   If indicated for the ordered procedure, I authorize the administration of contrast media per Radiology protocol    Answer:   Yes    Order Specific Question:   Preferred imaging location?    Answer:   [facility]    Order Specific Question:   Is Oral Contrast requested for this exam?    Answer:   Yes, Per Radiology protocol     The total time spent in the appointment was 20-29 minutes.   Allante Beane L Ritchie Klee, PA-C 11/03/20

## 2020-11-03 ENCOUNTER — Other Ambulatory Visit (HOSPITAL_COMMUNITY): Payer: Self-pay

## 2020-11-03 ENCOUNTER — Inpatient Hospital Stay: Payer: Medicare PPO

## 2020-11-03 ENCOUNTER — Inpatient Hospital Stay: Payer: Medicare PPO | Attending: Internal Medicine | Admitting: Physician Assistant

## 2020-11-03 ENCOUNTER — Other Ambulatory Visit: Payer: Self-pay

## 2020-11-03 ENCOUNTER — Inpatient Hospital Stay: Payer: Medicare PPO | Admitting: Nutrition

## 2020-11-03 VITALS — BP 133/67 | HR 74 | Temp 97.8°F | Resp 17 | Wt 172.1 lb

## 2020-11-03 DIAGNOSIS — M549 Dorsalgia, unspecified: Secondary | ICD-10-CM | POA: Diagnosis not present

## 2020-11-03 DIAGNOSIS — Z5112 Encounter for antineoplastic immunotherapy: Secondary | ICD-10-CM | POA: Insufficient documentation

## 2020-11-03 DIAGNOSIS — C3492 Malignant neoplasm of unspecified part of left bronchus or lung: Secondary | ICD-10-CM

## 2020-11-03 DIAGNOSIS — F32A Depression, unspecified: Secondary | ICD-10-CM | POA: Insufficient documentation

## 2020-11-03 DIAGNOSIS — R5383 Other fatigue: Secondary | ICD-10-CM | POA: Insufficient documentation

## 2020-11-03 DIAGNOSIS — M25552 Pain in left hip: Secondary | ICD-10-CM | POA: Diagnosis not present

## 2020-11-03 DIAGNOSIS — Z79899 Other long term (current) drug therapy: Secondary | ICD-10-CM | POA: Insufficient documentation

## 2020-11-03 DIAGNOSIS — E039 Hypothyroidism, unspecified: Secondary | ICD-10-CM | POA: Diagnosis not present

## 2020-11-03 DIAGNOSIS — R531 Weakness: Secondary | ICD-10-CM | POA: Diagnosis not present

## 2020-11-03 DIAGNOSIS — Z5111 Encounter for antineoplastic chemotherapy: Secondary | ICD-10-CM | POA: Insufficient documentation

## 2020-11-03 DIAGNOSIS — C3491 Malignant neoplasm of unspecified part of right bronchus or lung: Secondary | ICD-10-CM | POA: Insufficient documentation

## 2020-11-03 LAB — CMP (CANCER CENTER ONLY)
ALT: 20 U/L (ref 0–44)
AST: 30 U/L (ref 15–41)
Albumin: 3 g/dL — ABNORMAL LOW (ref 3.5–5.0)
Alkaline Phosphatase: 105 U/L (ref 38–126)
Anion gap: 11 (ref 5–15)
BUN: 10 mg/dL (ref 8–23)
CO2: 27 mmol/L (ref 22–32)
Calcium: 9.1 mg/dL (ref 8.9–10.3)
Chloride: 101 mmol/L (ref 98–111)
Creatinine: 0.67 mg/dL (ref 0.44–1.00)
GFR, Estimated: >60 mL/min (ref >60)
Glucose, Bld: 96 mg/dL (ref 70–99)
Potassium: 3.7 mmol/L (ref 3.5–5.1)
Sodium: 139 mmol/L (ref 135–145)
Total Bilirubin: 0.4 mg/dL (ref 0.3–1.2)
Total Protein: 6.9 g/dL (ref 6.5–8.1)

## 2020-11-03 LAB — CBC WITH DIFFERENTIAL (CANCER CENTER ONLY)
Abs Immature Granulocytes: 0.02 10*3/uL (ref 0.00–0.07)
Basophils Absolute: 0.1 10*3/uL (ref 0.0–0.1)
Basophils Relative: 1 %
Eosinophils Absolute: 0 10*3/uL (ref 0.0–0.5)
Eosinophils Relative: 1 %
HCT: 31.1 % — ABNORMAL LOW (ref 36.0–46.0)
Hemoglobin: 10.6 g/dL — ABNORMAL LOW (ref 12.0–15.0)
Immature Granulocytes: 0 %
Lymphocytes Relative: 15 %
Lymphs Abs: 0.8 10*3/uL (ref 0.7–4.0)
MCH: 33.2 pg (ref 26.0–34.0)
MCHC: 34.1 g/dL (ref 30.0–36.0)
MCV: 97.5 fL (ref 80.0–100.0)
Monocytes Absolute: 0.8 10*3/uL (ref 0.1–1.0)
Monocytes Relative: 14 %
Neutro Abs: 4 10*3/uL (ref 1.7–7.7)
Neutrophils Relative %: 69 %
Platelet Count: 351 10*3/uL (ref 150–400)
RBC: 3.19 MIL/uL — ABNORMAL LOW (ref 3.87–5.11)
RDW: 14.4 % (ref 11.5–15.5)
WBC Count: 5.7 10*3/uL (ref 4.0–10.5)
nRBC: 0 % (ref 0.0–0.2)

## 2020-11-03 LAB — TSH: TSH: 1.504 u[IU]/mL (ref 0.308–3.960)

## 2020-11-03 MED ORDER — SODIUM CHLORIDE 0.9 % IV SOLN
500.0000 mg/m2 | Freq: Once | INTRAVENOUS | Status: AC
  Administered 2020-11-03: 1000 mg via INTRAVENOUS
  Filled 2020-11-03: qty 40

## 2020-11-03 MED ORDER — SODIUM CHLORIDE 0.9 % IV SOLN: Freq: Once | INTRAVENOUS | Status: AC

## 2020-11-03 MED ORDER — SODIUM CHLORIDE 0.9 % IV SOLN
200.0000 mg | Freq: Once | INTRAVENOUS | Status: AC
  Administered 2020-11-03: 200 mg via INTRAVENOUS
  Filled 2020-11-03: qty 8

## 2020-11-03 MED ORDER — CYANOCOBALAMIN 1000 MCG/ML IJ SOLN
1000.0000 ug | Freq: Once | INTRAMUSCULAR | Status: AC
  Administered 2020-11-03: 1000 ug via INTRAMUSCULAR
  Filled 2020-11-03: qty 1

## 2020-11-03 MED ORDER — HEPARIN SOD (PORK) LOCK FLUSH 100 UNIT/ML IV SOLN
500.0000 [IU] | Freq: Once | INTRAVENOUS | Status: AC | PRN
  Administered 2020-11-03: 500 [IU]

## 2020-11-03 MED ORDER — PROCHLORPERAZINE MALEATE 10 MG PO TABS
10.0000 mg | ORAL_TABLET | Freq: Once | ORAL | Status: AC
  Administered 2020-11-03: 10 mg via ORAL
  Filled 2020-11-03: qty 1

## 2020-11-03 MED ORDER — SODIUM CHLORIDE 0.9% FLUSH
10.0000 mL | INTRAVENOUS | Status: DC | PRN
  Administered 2020-11-03: 10 mL

## 2020-11-03 NOTE — Patient Instructions (Signed)

## 2020-11-03 NOTE — Patient Instructions (Signed)
[facility] ONCOLOGY   Discharge Instructions: Thank you for choosing [facility] to provide your oncology and hematology care.   If you have a lab appointment with the [facility], please go directly to the [facility] and check in at the registration area.   Wear comfortable clothing and clothing appropriate for easy access to any Portacath or PICC line.   We strive to give you quality time with your provider. You may need to reschedule your appointment if you arrive late (15 or more minutes).  Arriving late affects you and other patients whose appointments are after yours.  Also, if you miss three or more appointments without notifying the office, you may be dismissed from the clinic at the provider's discretion.      For prescription refill requests, have your pharmacy contact our office and allow 72 hours for refills to be completed.    Today you received the following chemotherapy and/or immunotherapy agents: pemetrexed and pembrolizumab.      To help prevent nausea and vomiting after your treatment, we encourage you to take your nausea medication as directed.  BELOW ARE SYMPTOMS THAT SHOULD BE REPORTED IMMEDIATELY: *FEVER GREATER THAN 100.4 F (38 C) OR HIGHER *CHILLS OR SWEATING *NAUSEA AND VOMITING THAT IS NOT CONTROLLED WITH YOUR NAUSEA MEDICATION *UNUSUAL SHORTNESS OF BREATH *UNUSUAL BRUISING OR BLEEDING *URINARY PROBLEMS (pain or burning when urinating, or frequent urination) *BOWEL PROBLEMS (unusual diarrhea, constipation, pain near the anus) TENDERNESS IN MOUTH AND THROAT WITH OR WITHOUT PRESENCE OF ULCERS (sore throat, sores in mouth, or a toothache) UNUSUAL RASH, SWELLING OR PAIN  UNUSUAL VAGINAL DISCHARGE OR ITCHING   Items with * indicate a potential emergency and should be followed up as soon as possible or go to the Emergency Department if any problems should occur.  Please show the CHEMOTHERAPY ALERT CARD or IMMUNOTHERAPY  ALERT CARD at check-in to the Emergency Department and triage nurse.  Should you have questions after your visit or need to cancel or reschedule your appointment, please contact [facility]  Dept: [phone]  and follow the prompts.  Office hours are 8:00 a.m. to 4:30 p.m. Monday - Friday. Please note that voicemails left after 4:00 p.m. may not be returned until the following business day.  We are closed weekends and major holidays. You have access to a nurse at all times for urgent questions. Please call the main number to the clinic Dept: [phone] and follow the prompts.   For any non-urgent questions, you may also contact your provider using MyChart. We now offer e-Visits for anyone 30 and older to request care online for non-urgent symptoms. For details visit [email or web address].   Also download the MyChart app! Go to the app store, search "MyChart", open the app, select [facility], and log in with your MyChart username and password.  Due to Covid, a mask is required upon entering the hospital/clinic. If you do not have a mask, one will be given to you upon arrival. For doctor visits, patients may have 1 support person aged 18 or older with them. For treatment visits, patients cannot have anyone with them due to current Covid guidelines and our immunocompromised population.

## 2020-11-03 NOTE — Progress Notes (Signed)
Nutrition follow-up completed with patient during infusion for lung cancer.  Weight decreased and documented as 172.1 pounds September 6 decreased from 173.8 pounds August 15. Labs were reviewed. Patient reports she has had increased fatigue since last treatment.  States she has not felt like getting up to make meals or snacks. Patient still walks her dog every day. Reports taking Metamucil which has improved bowels.  Nutrition diagnosis: Unintended weight loss continues.  Intervention: Provided patient with snack suggestions which would be easy to purchase or prepare and encouraged her to keep these in a box next to the table where she sleeps or sits. Continue oral nutrition supplements. Continue smaller more frequent meals and snacks to promote adequate calories and encourage sustained energy throughout the day.  Monitoring, evaluation, goals: Patient will tolerate increased calories and protein to minimize weight loss.  Next visit: Tuesday, September 27 during infusion.  **Disclaimer: This note was dictated with voice recognition software. Similar sounding words can inadvertently be transcribed and this note may contain transcription errors which may not have been corrected upon publication of note.**

## 2020-11-04 ENCOUNTER — Telehealth: Payer: Self-pay | Admitting: Internal Medicine

## 2020-11-04 NOTE — Telephone Encounter (Signed)
Scheduled appt per 9/6 los - patient to get an updated schedule next visit

## 2020-11-12 ENCOUNTER — Other Ambulatory Visit (HOSPITAL_COMMUNITY): Payer: Self-pay

## 2020-11-12 MED ORDER — ALPRAZOLAM 0.25 MG PO TABS
ORAL_TABLET | ORAL | 0 refills | Status: DC
  Filled 2020-11-12: qty 30, 30d supply, fill #0

## 2020-11-12 MED ORDER — NEOMYCIN-POLYMYXIN-DEXAMETH 3.5-10000-0.1 OP SUSP
OPHTHALMIC | 0 refills | Status: DC
  Filled 2020-11-12: qty 5, 20d supply, fill #0

## 2020-11-13 ENCOUNTER — Other Ambulatory Visit (HOSPITAL_COMMUNITY): Payer: Self-pay

## 2020-11-19 ENCOUNTER — Ambulatory Visit (HOSPITAL_COMMUNITY)
Admission: RE | Admit: 2020-11-19 | Discharge: 2020-11-19 | Disposition: A | Discharge status: Home / Self Care | Payer: Medicare PPO | Source: Ambulatory Visit | Attending: Physician Assistant | Admitting: Physician Assistant

## 2020-11-19 DIAGNOSIS — K5790 Diverticulosis of intestine, part unspecified, without perforation or abscess without bleeding: Secondary | ICD-10-CM | POA: Diagnosis not present

## 2020-11-19 DIAGNOSIS — C3492 Malignant neoplasm of unspecified part of left bronchus or lung: Secondary | ICD-10-CM | POA: Insufficient documentation

## 2020-11-19 DIAGNOSIS — C3490 Malignant neoplasm of unspecified part of unspecified bronchus or lung: Secondary | ICD-10-CM | POA: Diagnosis not present

## 2020-11-19 DIAGNOSIS — R911 Solitary pulmonary nodule: Secondary | ICD-10-CM | POA: Diagnosis not present

## 2020-11-19 DIAGNOSIS — I700 Atherosclerosis of aorta: Secondary | ICD-10-CM | POA: Diagnosis not present

## 2020-11-19 DIAGNOSIS — C7951 Secondary malignant neoplasm of bone: Secondary | ICD-10-CM | POA: Diagnosis not present

## 2020-11-19 DIAGNOSIS — R918 Other nonspecific abnormal finding of lung field: Secondary | ICD-10-CM | POA: Diagnosis not present

## 2020-11-19 DIAGNOSIS — I313 Pericardial effusion (noninflammatory): Secondary | ICD-10-CM | POA: Diagnosis not present

## 2020-11-19 MED ORDER — IOHEXOL 350 MG/ML SOLN
80.0000 mL | Freq: Once | INTRAVENOUS | Status: AC | PRN
  Administered 2020-11-19: 80 mL via INTRAVENOUS

## 2020-11-19 MED ORDER — HEPARIN SOD (PORK) LOCK FLUSH 100 UNIT/ML IV SOLN
500.0000 [IU] | Freq: Once | INTRAVENOUS | Status: AC
  Administered 2020-11-19: 500 [IU] via INTRAVENOUS

## 2020-11-24 ENCOUNTER — Inpatient Hospital Stay: Payer: Medicare PPO

## 2020-11-24 ENCOUNTER — Other Ambulatory Visit: Payer: Self-pay

## 2020-11-24 ENCOUNTER — Encounter: Payer: Self-pay | Admitting: Internal Medicine

## 2020-11-24 ENCOUNTER — Telehealth: Payer: Self-pay

## 2020-11-24 ENCOUNTER — Encounter: Payer: Self-pay | Admitting: *Deleted

## 2020-11-24 ENCOUNTER — Other Ambulatory Visit (HOSPITAL_COMMUNITY): Payer: Self-pay

## 2020-11-24 ENCOUNTER — Telehealth: Payer: Self-pay | Admitting: Pharmacist

## 2020-11-24 ENCOUNTER — Inpatient Hospital Stay: Payer: Medicare PPO | Admitting: Internal Medicine

## 2020-11-24 ENCOUNTER — Inpatient Hospital Stay: Payer: Medicare PPO | Admitting: Nutrition

## 2020-11-24 VITALS — BP 126/63 | HR 86 | Temp 98.1°F | Resp 19 | Ht 64.0 in | Wt 172.5 lb

## 2020-11-24 DIAGNOSIS — M549 Dorsalgia, unspecified: Secondary | ICD-10-CM | POA: Diagnosis not present

## 2020-11-24 DIAGNOSIS — Z5111 Encounter for antineoplastic chemotherapy: Secondary | ICD-10-CM | POA: Diagnosis not present

## 2020-11-24 DIAGNOSIS — C3492 Malignant neoplasm of unspecified part of left bronchus or lung: Secondary | ICD-10-CM

## 2020-11-24 DIAGNOSIS — Z95828 Presence of other vascular implants and grafts: Secondary | ICD-10-CM | POA: Diagnosis not present

## 2020-11-24 DIAGNOSIS — C3491 Malignant neoplasm of unspecified part of right bronchus or lung: Secondary | ICD-10-CM | POA: Diagnosis not present

## 2020-11-24 DIAGNOSIS — F32A Depression, unspecified: Secondary | ICD-10-CM | POA: Diagnosis not present

## 2020-11-24 DIAGNOSIS — Z5112 Encounter for antineoplastic immunotherapy: Secondary | ICD-10-CM | POA: Diagnosis not present

## 2020-11-24 DIAGNOSIS — E039 Hypothyroidism, unspecified: Secondary | ICD-10-CM | POA: Diagnosis not present

## 2020-11-24 DIAGNOSIS — R531 Weakness: Secondary | ICD-10-CM | POA: Diagnosis not present

## 2020-11-24 DIAGNOSIS — R5383 Other fatigue: Secondary | ICD-10-CM | POA: Diagnosis not present

## 2020-11-24 DIAGNOSIS — M25552 Pain in left hip: Secondary | ICD-10-CM | POA: Diagnosis not present

## 2020-11-24 LAB — CBC WITH DIFFERENTIAL (CANCER CENTER ONLY)
Abs Immature Granulocytes: 0.01 10*3/uL (ref 0.00–0.07)
Basophils Absolute: 0.1 10*3/uL (ref 0.0–0.1)
Basophils Relative: 1 %
Eosinophils Absolute: 0.1 10*3/uL (ref 0.0–0.5)
Eosinophils Relative: 1 %
HCT: 31.4 % — ABNORMAL LOW (ref 36.0–46.0)
Hemoglobin: 10.6 g/dL — ABNORMAL LOW (ref 12.0–15.0)
Immature Granulocytes: 0 %
Lymphocytes Relative: 14 %
Lymphs Abs: 0.7 10*3/uL (ref 0.7–4.0)
MCH: 33.3 pg (ref 26.0–34.0)
MCHC: 33.8 g/dL (ref 30.0–36.0)
MCV: 98.7 fL (ref 80.0–100.0)
Monocytes Absolute: 0.8 10*3/uL (ref 0.1–1.0)
Monocytes Relative: 15 %
Neutro Abs: 3.4 10*3/uL (ref 1.7–7.7)
Neutrophils Relative %: 69 %
Platelet Count: 378 10*3/uL (ref 150–400)
RBC: 3.18 MIL/uL — ABNORMAL LOW (ref 3.87–5.11)
RDW: 14.3 % (ref 11.5–15.5)
WBC Count: 5 10*3/uL (ref 4.0–10.5)
nRBC: 0 % (ref 0.0–0.2)

## 2020-11-24 LAB — CMP (CANCER CENTER ONLY)
ALT: 19 U/L (ref 0–44)
AST: 30 U/L (ref 15–41)
Albumin: 2.9 g/dL — ABNORMAL LOW (ref 3.5–5.0)
Alkaline Phosphatase: 121 U/L (ref 38–126)
Anion gap: 11 (ref 5–15)
BUN: 9 mg/dL (ref 8–23)
CO2: 26 mmol/L (ref 22–32)
Calcium: 9.2 mg/dL (ref 8.9–10.3)
Chloride: 100 mmol/L (ref 98–111)
Creatinine: 0.64 mg/dL (ref 0.44–1.00)
GFR, Estimated: >60 mL/min (ref >60)
Glucose, Bld: 102 mg/dL — ABNORMAL HIGH (ref 70–99)
Potassium: 3.7 mmol/L (ref 3.5–5.1)
Sodium: 137 mmol/L (ref 135–145)
Total Bilirubin: 0.3 mg/dL (ref 0.3–1.2)
Total Protein: 7 g/dL (ref 6.5–8.1)

## 2020-11-24 LAB — TSH: TSH: 5.919 u[IU]/mL — ABNORMAL HIGH (ref 0.308–3.960)

## 2020-11-24 MED ORDER — HEPARIN SOD (PORK) LOCK FLUSH 100 UNIT/ML IV SOLN
500.0000 [IU] | Freq: Once | INTRAVENOUS | Status: AC
  Administered 2020-11-24: 500 [IU]

## 2020-11-24 MED ORDER — LUMAKRAS 120 MG PO TABS
960.0000 mg | ORAL_TABLET | Freq: Every day | ORAL | 3 refills | Status: DC
  Filled 2020-11-24: qty 240, fill #0
  Filled 2020-11-26: qty 240, 30d supply, fill #0
  Filled 2020-12-18: qty 240, 30d supply, fill #1

## 2020-11-24 MED ORDER — SODIUM CHLORIDE 0.9% FLUSH
10.0000 mL | Freq: Once | INTRAVENOUS | Status: AC
  Administered 2020-11-24: 10 mL

## 2020-11-24 NOTE — Progress Notes (Signed)
DISCONTINUE ON PATHWAY REGIMEN - Non-Small Cell Lung     A cycle is every 21 days:     Pembrolizumab      Pemetrexed      Carboplatin   **Always confirm dose/schedule in your pharmacy ordering system**  REASON: Disease Progression PRIOR TREATMENT: [id number]: Pembrolizumab 200 mg + Pemetrexed 500 mg/m2 + Carboplatin AUC=5 q21 Days x 4 Cycles TREATMENT RESPONSE: Partial Response (PR)  START OFF PATHWAY REGIMEN - Non-Small Cell Lung   [id number]:Sotorasib 960 mg PO Daily D1-28 q28 Days:   A cycle is every 28 days:     Sotorasib   **Always confirm dose/schedule in your pharmacy ordering system**  Patient Characteristics: Stage IV Metastatic, Nonsquamous, Molecular Analysis Completed, Molecular Alteration Present and Targeted Therapy Exhausted OR EGFR Exon 20+ or KRAS G12C+ or HER2+ Present and No Prior Chemo/Immunotherapy OR No Alteration Present, Second Line -  Chemotherapy/Immunotherapy, PS = 0, 1, No Prior PD-1/PD-L1  Inhibitor or Prior PD-1/PD-L1 Inhibitor + Chemotherapy, and Not a Candidate for Immunotherapy Therapeutic Status: Stage IV Metastatic Histology: Nonsquamous Cell Broad Molecular Profiling Status: Molecular Analysis Completed Molecular Analysis Results: KRAS G12C Mutation Present and No Prior Chemo/Immunotherapy ECOG Performance Status: 1 Chemotherapy/Immunotherapy Line of Therapy: Second Line Chemotherapy/Immunotherapy Immunotherapy Candidate Status: Not a Candidate for Immunotherapy Prior Immunotherapy Status: Prior PD-1/PD-L1 Inhibitor + Chemotherapy Intent of Therapy: Non-Curative / Palliative Intent, Discussed with Patient 

## 2020-11-24 NOTE — Telephone Encounter (Signed)
Oral Oncology Patient Advocate Encounter  Prior Authorization for Cindy [name] has been approved.    PA# [id number] Effective dates: 11/24/20 through 05/23/21  Patients co-pay is $100  Oral Oncology Clinic will continue to follow.   [facility] Patient [facility] Phone [phone] Fax [phone] 11/24/2020 2:12 PM

## 2020-11-24 NOTE — Telephone Encounter (Addendum)
Oral Oncology Pharmacist Encounter  I met with patient for overview of new oral chemotherapy medication: Lumakras (sotorasib) for the treatment of metastatic non-small cell lung cancer, KRAS G12C-mutated, planned duration until disease progression or unacceptable drug toxicity.  Prescription dose and frequency assessed for appropriateness. Appropriate for therapy initiation. CBC w/ Diff and CMP from 11/24/20 assessed, no relevant lab abnormalities noted.   Counseled patient on administration, dosing, side effects, monitoring, drug-food interactions, safe handling, storage, and disposal.  Current medication list in Epic reviewed, DDIs with Lumakras identified: Category C DDI between Lumakras and Sertraline - Lumakras may decrease serum concentrations of sertraline via CYP3A4 induction. Patient aware of this interaction and knows dose of sertraline may need to be adjusted while on Lumakras. No change in therapy required at this time.  Category C DDI between Lumakras and simvastatin - Lumakras may decrease serum concentrations of simvastatin via CYP3A4 induction. Patient aware. No change in therapy required at this time.  Category C DDI between Lumakras and Xanax - Lumakras may decrease serum concentrations of Xanax via CYP3A4 induction. Patient aware. No change in therapy required at this time.  Category C DDI between Lumakras and Hycodan - Lumakras may decrease serum concentrations of Hycodan, thus decreasing efficacy. Patient aware.    Patient will take Lumakras 120 mg tablets, 8 tablets (960 mg total) by mouth daily.   Patient knows to avoid proton pump inhibitors, histamine blockers, and grapefruit/grapefruit juice while on Lumakras. Patient knows if she needs to take Tums or an antacid for any reason this should be taken either 4 hours after Lumakras. If an antacid is taken, she will need to wait at least 10 hours until taking dose of Lumakras.     Side effects include but are not limited to:  decrease in blood counts, diarrhea, hepatotoxicity, arthralgias/musculoskeletal pains. Also reviewed rare but serious side effect of interstitial lung disease/pneumonitis that have been reported with use of medication.    Reviewed with patient importance of keeping a medication schedule and plan for any missed doses. After discussion with patient no patient barriers to medication adherence identified.   Start date: ~11/28/20   Prescription has been e-scribed to the [facility] for benefits analysis and approval.  Copay for 1st fill of Lumakras is $100. This is affordable for patient. Medication will be ordered and then shipped to patient on 11/26/20 for delivery on 11/27/20.  Patient agreement for treatment documented in MD note on 11/24/20.  All questions answered.  Ms. Camilo voiced understanding and appreciation.   Medication education handout given to patient. Patient knows to call the office with questions or concerns. Oral Chemotherapy Clinic phone number provided to patient.  Rebecca Fanning, PharmD, BCPS Hematology/Oncology Clinical Pharmacist [facility] [phone] 11/24/2020 11:23 AM       

## 2020-11-24 NOTE — Progress Notes (Signed)
[facility] [phone]   [phone]  OFFICE PROGRESS NOTE  [name], MD [address] [place] [address]  DIAGNOSIS: stage IV (T1b, N1, M1 a) non-small cell lung cancer favoring adenocarcinoma diagnosed in October 2021.  The pathologic immunohistochemical stains suggestive of upper GI primary but there is no finding on the PET scan or imaging study to support this possibility.  Biomarker Findings Microsatellite status - MS-Stable Tumor Mutational Burden - 3 Muts/Mb Genomic Findings For a complete list of the genes assayed, please refer to the Appendix. KRAS G12C STK11 E145* 7 Disease relevant genes with no reportable alterations: ALK, BRAF, EGFR, ERBB2, MET, RET, ROS1  PDL1 Expression 0%  PRIOR THERAPY: Systemic chemotherapy with carboplatin for AUC of 5, Alimta 500 mg/M2 and Keytruda 200 mg IV every 3 weeks.  First dose March 12, 2019.  Status post 12 cycles.  Starting from cycle #5 the patient will be on maintenance treatment with Alimta and Keytruda every 3 weeks.  Last dose of chemotherapy was given on 11/02/2020.  Discontinued secondary to disease progression.  CURRENT THERAPY: Lumakras (Sotorasib) 960 mg p.o. daily.  Expected to start in the next few days.  INTERVAL HISTORY: Cindy [name] 75 y.o. female returns to the clinic today for follow-up visit accompanied by her friend.  The patient continues to complain of increasing fatigue and weakness as well as pain in the left hip and back.  She had more fatigue after the last cycle of her treatment.  She denied having any current chest pain but has shortness of breath with exertion with mild cough and no hemoptysis.  She denied having any nausea, vomiting, diarrhea or constipation.  She has no headache or visual changes.  She had repeat CT scan of the chest, abdomen pelvis performed recently and she is here for evaluation and discussion of her scan results and  treatment options.     MEDICAL HISTORY: Past Medical History:  Diagnosis Date   Anxiety    Depression    Hyperlipidemia    Hypertension     ALLERGIES:  is allergic to escitalopram and procaine.  MEDICATIONS:  Current Outpatient Medications  Medication Sig Dispense Refill   ALPRAZolam (XANAX) 0.25 MG tablet Take 1 tablet by mouth once a day as needed 30 tablet 0   Ascorbic Acid (VITAMIN C PO) Take 1 tablet by mouth daily.     aspirin 81 MG tablet Take 81 mg by mouth daily.     b complex vitamins capsule Take 1 capsule by mouth 2 (two) times a week.     calcium carbonate (TUMS - DOSED IN MG ELEMENTAL CALCIUM) 500 MG chewable tablet Chew 500-1,000 mg by mouth daily as needed for indigestion or heartburn.     folic acid (FOLVITE) 1 MG tablet TAKE 1 TABLET(1 MG) BY MOUTH DAILY 30 tablet 1   hydrochlorothiazide (HYDRODIURIL) 25 MG tablet Take 1 tablet (25 mg total) by mouth every morning. 90 tablet 1   HYDROcodone bit-homatropine (HYCODAN) 5-1.5 MG/5ML syrup Take 5 mLs by mouth every 6 hours as needed for cough. 120 mL 0   levothyroxine (SYNTHROID) 75 MCG tablet Take 1 tablet by mouth on an empty stomach once daily in the morning 30 tablet 12   lidocaine-prilocaine (EMLA) cream Apply to the Port-A-Cath site 30-60-minute before chemotherapy. 30 g 0   metoprolol succinate (TOPROL-XL) 50 MG 24 hr tablet Take 1 tablet (50 mg total) by mouth daily. 90 tablet  3   Multiple Minerals-Vitamins (CALCIUM-MAGNESIUM-ZINC-D3 PO) Take 1 tablet by mouth 2 (two) times a week.     Multiple Vitamin (MULTIVITAMIN) tablet Take 1 tablet by mouth 3 (three) times a week.     neomycin-polymyxin b-dexamethasone (MAXITROL) 3.5-10000-0.1 SUSP Place 1 drop into the both eyes 4 times a day 5 mL 0   Polyvinyl Alcohol-Povidone (REFRESH OP) Place 1 drop into both eyes daily as needed (dry eyes).     prochlorperazine (COMPAZINE) 10 MG tablet TAKE 1 TABLET(10 MG) BY MOUTH EVERY 6 HOURS AS NEEDED FOR NAUSEA OR VOMITING 30  tablet 0   sertraline (ZOLOFT) 50 MG tablet Take 1 tablet by mouth Once a day 30 tablet 12   simvastatin (ZOCOR) 20 MG tablet Take 1 tablet (20 mg total) by mouth every evening. 90 tablet 1   VITAMIN D PO Take 1 capsule by mouth 3 (three) times a week.     VITAMIN E PO Take 1 capsule by mouth 2 (two) times a week.     No current facility-administered medications for this visit.    SURGICAL HISTORY:  Past Surgical History:  Procedure Laterality Date   DENTAL SURGERY  2012 - 2013   implants   IR IMAGING GUIDED PORT INSERTION  08/27/2020   IR THORACENTESIS ASP PLEURAL SPACE W/IMG GUIDE  12/19/2019   WISDOM TOOTH EXTRACTION  20    REVIEW OF SYSTEMS:  Constitutional: positive for fatigue Eyes: negative Ears, nose, mouth, throat, and face: negative Respiratory: positive for cough and dyspnea on exertion Cardiovascular: negative Gastrointestinal: negative Genitourinary:negative Integument/breast: negative Hematologic/lymphatic: negative Musculoskeletal:positive for bone pain Neurological: negative Behavioral/Psych: negative Endocrine: negative Allergic/Immunologic: negative   PHYSICAL EXAMINATION: General appearance: alert, cooperative, and no distress Head: Normocephalic, without obvious abnormality, atraumatic Neck: no adenopathy, no JVD, supple, symmetrical, trachea midline, and thyroid not enlarged, symmetric, no tenderness/mass/nodules Lymph nodes: Cervical, supraclavicular, and axillary nodes normal. Resp: clear to auscultation bilaterally Back: symmetric, no curvature. ROM normal. No CVA tenderness. Cardio: regular rate and rhythm, S1, S2 normal, no murmur, click, rub or gallop GI: soft, non-tender; bowel sounds normal; no masses,  no organomegaly Extremities: extremities normal, atraumatic, no cyanosis or edema Neurologic: Alert and oriented X 3, normal strength and tone. Normal symmetric reflexes. Normal coordination and gait  ECOG PERFORMANCE STATUS: 1 - Symptomatic but  completely ambulatory  Blood pressure 126/63, pulse 86, temperature 98.1 F (36.7 C), temperature source Oral, resp. rate 19, height $RemoveBe'5\' 4"'PqOvTcLzm$  (1.626 m), weight 172 lb 8 oz (78.2 kg), last menstrual period 09/29/2002, SpO2 96 %.  LABORATORY DATA: Lab Results  Component Value Date   WBC 5.0 11/24/2020   HGB 10.6 (L) 11/24/2020   HCT 31.4 (L) 11/24/2020   MCV 98.7 11/24/2020   PLT 378 11/24/2020      Chemistry      Component Value Date/Time   NA 139 11/03/2020 1057   K 3.7 11/03/2020 1057   CL 101 11/03/2020 1057   CO2 27 11/03/2020 1057   BUN 10 11/03/2020 1057   CREATININE 0.67 11/03/2020 1057      Component Value Date/Time   CALCIUM 9.1 11/03/2020 1057   ALKPHOS 105 11/03/2020 1057   AST 30 11/03/2020 1057   ALT 20 11/03/2020 1057   BILITOT 0.4 11/03/2020 1057       RADIOGRAPHIC STUDIES: CT Chest W Contrast  Result Date: 11/20/2020 CLINICAL DATA:  Non-small-cell lung cancer.  Restaging. EXAM: CT CHEST, ABDOMEN, AND PELVIS WITH CONTRAST TECHNIQUE: Multidetector CT imaging of the chest, abdomen  and pelvis was performed following the standard protocol during bolus administration of intravenous contrast. CONTRAST:  73mL OMNIPAQUE IOHEXOL 350 MG/ML SOLN COMPARISON:  09/18/2020 FINDINGS: CT CHEST FINDINGS Cardiovascular: The heart size is normal. Trace pericardial effusion. Coronary artery calcification is evident. Mild atherosclerotic calcification is noted in the wall of the thoracic aorta. Right Port-A-Cath tip is in the mid right atrium. Mediastinum/Nodes: Small mediastinal lymph nodes are similar to prior including 7 mm short axis right paratracheal node on 15/2. There is no hilar lymphadenopathy. Abnormal soft tissue attenuation in the left hilum is stable. The esophagus has normal imaging features. There is no axillary lymphadenopathy. Lungs/Pleura: Volume loss left hemithorax is stable. The circumferential irregular pleural thickening in the left chest is not substantially  changed although there is a general diffuse mild increase in pleural soft tissue thickening. Compare anteromedial left hemithorax on image 26/2 today to image 28/2 previously. Soft tissue thickening along the anterior mediastinum measures 8 mm today compared to 4 mm previously (remeasured). Similarly, soft tissue thickening in the left retro hilar region, at the level of the inferior left renal vein measures 13 mm today (31/2) compared to 8 mm previously (remeasured). Lateral pleural thickening on that same image measures 13 mm today compared to 9 mm previously (remeasured). 4 mm right upper lobe perifissural nodule on 57/4 was 2-3 mm previously (remeasured) subtle nodularity along the minor fissure is new in the interval. Tiny 3 mm posterior right lower lobe nodule on 70/4 is new. 3 mm right middle lobe nodule on 63/4 is new. Musculoskeletal: No worrisome lytic or sclerotic osseous abnormality. CT ABDOMEN PELVIS FINDINGS Hepatobiliary: No suspicious focal abnormality within the liver parenchyma. There is no evidence for gallstones, gallbladder wall thickening, or pericholecystic fluid. No intrahepatic or extrahepatic biliary dilation. Pancreas: No focal mass lesion. No dilatation of the main duct. No intraparenchymal cyst. No peripancreatic edema. Spleen: No splenomegaly. No focal mass lesion. Adrenals/Urinary Tract: No adrenal nodule or mass. Kidneys unremarkable. No evidence for hydroureter. The urinary bladder appears normal for the degree of distention. Stomach/Bowel: Stomach is unremarkable. No gastric wall thickening. No evidence of outlet obstruction. Duodenum is normally positioned as is the ligament of Treitz. No small bowel wall thickening. No small bowel dilatation. The terminal ileum is normal. The appendix is normal. No gross colonic mass. No colonic wall thickening. Diverticular changes are noted in the left colon without evidence of diverticulitis. Vascular/Lymphatic: There is moderate  atherosclerotic calcification of the abdominal aorta without aneurysm. There is no gastrohepatic or hepatoduodenal ligament lymphadenopathy. No retroperitoneal or mesenteric lymphadenopathy. No pelvic sidewall lymphadenopathy. Reproductive: The uterus is unremarkable.  There is no adnexal mass. Other: No intraperitoneal free fluid. Musculoskeletal: Interval progression of bony metastatic involvement. Index 5 mm left ischial lesion measured previously is 7 mm on 01/12/2 today. 12 mm left paramidline sacral lesion on 94/2 was 10 mm (remeasured) previously. 13 mm sclerotic lesion in the posterior L3 vertebral body (73/2) was 12 mm previously. IMPRESSION: 1. Generalized subtle progression of left pleural soft tissue thickening with similar appearance of circumferential irregular pleural thickening in the left hemithorax. 2. Persistent interstitial thickening consistent with lymphangitic tumor spread. 3. Similar appearance of small mediastinal lymph nodes. 4. Interval development of tiny right pulmonary nodules with subtle nodularity along the minor fissure. Imaging features concerning for metastatic disease. 5. Interval mild progression of bony metastatic involvement. 6. Aortic Atherosclerosis (ICD10-I70.0). Electronically Signed   By: Misty Stanley M.D.   On: 11/20/2020 07:04   CT Abdomen Pelvis  W Contrast  Result Date: 11/20/2020 CLINICAL DATA:  Non-small-cell lung cancer.  Restaging. EXAM: CT CHEST, ABDOMEN, AND PELVIS WITH CONTRAST TECHNIQUE: Multidetector CT imaging of the chest, abdomen and pelvis was performed following the standard protocol during bolus administration of intravenous contrast. CONTRAST:  40mL OMNIPAQUE IOHEXOL 350 MG/ML SOLN COMPARISON:  09/18/2020 FINDINGS: CT CHEST FINDINGS Cardiovascular: The heart size is normal. Trace pericardial effusion. Coronary artery calcification is evident. Mild atherosclerotic calcification is noted in the wall of the thoracic aorta. Right Port-A-Cath tip is in  the mid right atrium. Mediastinum/Nodes: Small mediastinal lymph nodes are similar to prior including 7 mm short axis right paratracheal node on 15/2. There is no hilar lymphadenopathy. Abnormal soft tissue attenuation in the left hilum is stable. The esophagus has normal imaging features. There is no axillary lymphadenopathy. Lungs/Pleura: Volume loss left hemithorax is stable. The circumferential irregular pleural thickening in the left chest is not substantially changed although there is a general diffuse mild increase in pleural soft tissue thickening. Compare anteromedial left hemithorax on image 26/2 today to image 28/2 previously. Soft tissue thickening along the anterior mediastinum measures 8 mm today compared to 4 mm previously (remeasured). Similarly, soft tissue thickening in the left retro hilar region, at the level of the inferior left renal vein measures 13 mm today (31/2) compared to 8 mm previously (remeasured). Lateral pleural thickening on that same image measures 13 mm today compared to 9 mm previously (remeasured). 4 mm right upper lobe perifissural nodule on 57/4 was 2-3 mm previously (remeasured) subtle nodularity along the minor fissure is new in the interval. Tiny 3 mm posterior right lower lobe nodule on 70/4 is new. 3 mm right middle lobe nodule on 63/4 is new. Musculoskeletal: No worrisome lytic or sclerotic osseous abnormality. CT ABDOMEN PELVIS FINDINGS Hepatobiliary: No suspicious focal abnormality within the liver parenchyma. There is no evidence for gallstones, gallbladder wall thickening, or pericholecystic fluid. No intrahepatic or extrahepatic biliary dilation. Pancreas: No focal mass lesion. No dilatation of the main duct. No intraparenchymal cyst. No peripancreatic edema. Spleen: No splenomegaly. No focal mass lesion. Adrenals/Urinary Tract: No adrenal nodule or mass. Kidneys unremarkable. No evidence for hydroureter. The urinary bladder appears normal for the degree of  distention. Stomach/Bowel: Stomach is unremarkable. No gastric wall thickening. No evidence of outlet obstruction. Duodenum is normally positioned as is the ligament of Treitz. No small bowel wall thickening. No small bowel dilatation. The terminal ileum is normal. The appendix is normal. No gross colonic mass. No colonic wall thickening. Diverticular changes are noted in the left colon without evidence of diverticulitis. Vascular/Lymphatic: There is moderate atherosclerotic calcification of the abdominal aorta without aneurysm. There is no gastrohepatic or hepatoduodenal ligament lymphadenopathy. No retroperitoneal or mesenteric lymphadenopathy. No pelvic sidewall lymphadenopathy. Reproductive: The uterus is unremarkable.  There is no adnexal mass. Other: No intraperitoneal free fluid. Musculoskeletal: Interval progression of bony metastatic involvement. Index 5 mm left ischial lesion measured previously is 7 mm on 01/12/2 today. 12 mm left paramidline sacral lesion on 94/2 was 10 mm (remeasured) previously. 13 mm sclerotic lesion in the posterior L3 vertebral body (73/2) was 12 mm previously. IMPRESSION: 1. Generalized subtle progression of left pleural soft tissue thickening with similar appearance of circumferential irregular pleural thickening in the left hemithorax. 2. Persistent interstitial thickening consistent with lymphangitic tumor spread. 3. Similar appearance of small mediastinal lymph nodes. 4. Interval development of tiny right pulmonary nodules with subtle nodularity along the minor fissure. Imaging features concerning for metastatic disease. 5.  Interval mild progression of bony metastatic involvement. 6. Aortic Atherosclerosis (ICD10-I70.0). Electronically Signed   By: Misty Stanley M.D.   On: 11/20/2020 07:04     ASSESSMENT AND PLAN: This is a very pleasant 75 years old white female recently diagnosed with a stage IV (T1b, N2, M1 a) non-small cell lung cancer, adenocarcinoma with no actionable  mutations based on the molecular studies by [facility]. The patient underwent CT-guided core biopsy of one of the left lung pleural-based nodules by interventional radiology and the final pathology was consistent with metastatic adenocarcinoma. Her tissue block was [facility] to [facility] for molecular studies and PD-L1 expression.  The molecular studies by [facility] showed positive K-ras G12C mutation which will be an option for targeted therapy in the second line setting with Lumakras (Sotorasib). The patient is currently undergoing palliative systemic chemotherapy with carboplatin for AUC of 5, Alimta 500 mg/M2 and Keytruda 200 mg IV every 3 weeks.  Status post 12 cycles.  Starting from cycle #5 the patient will be on maintenance treatment with Alimta and Keytruda every 3 weeks. The patient has been tolerating her treatment well except for the increasing fatigue and also worsening back pain recently. She had repeat CT scan of the chest, abdomen pelvis performed recently.  I personally and independently reviewed the scan images and discussed the results with the patient and her friend. Unfortunately her scan showed evidence for disease progression of the left pleural soft tissue thickening as well as interstitial thickening consistent with lymphangitic tumor spread and worsening bone metastasis. I recommended for the patient to discontinue her current treatment with maintenance Alimta and Keytruda at this point. I discussed with the patient second line treatment options with Lumakras (Sotorasib) 960 mg p.o. daily.  I discussed with the patient the adverse effect of this treatment and she will meet with the pharmacist for oral oncolytic for education and also to help her obtaining her medication. The patient was also giving the option of palliative care. She is interested in the treatment with Lumakras (Sotorasib). I will see her back for follow-up visit in 2 weeks for evaluation and management of any  adverse effect of her treatment. For the hypothyroidism, she will continue her current dose with levothyroxine. For the depression, she is currently on Prozac. For the back pain, she will continue her current treatment with Percocet on as-needed basis. The patient was advised to call immediately if she has any concerning symptoms in the interval.  The patient voices understanding of current disease status and treatment options and is in agreement with the current care plan.  All questions were answered. The patient knows to call the clinic with any problems, questions or concerns. We can certainly see the patient much sooner if necessary.  Disclaimer: This note was dictated with voice recognition software. Similar sounding words can inadvertently be transcribed and may not be corrected upon review.

## 2020-11-24 NOTE — Progress Notes (Signed)
I spoke with patient and her friend today during clinic.  Unfortunately, Cindy [name] has progressed.  Her new treatment plan is oral targeted therapy.  I gave and explained information on medication.  Dr. Julien Nordmann notified oral pharmacist to come and talk to her about medication.

## 2020-11-24 NOTE — Telephone Encounter (Signed)
Oral Oncology Patient Advocate Encounter   Received notification from [facility] that prior authorization for Lumakras is required.   PA submitted on CoverMyMeds Key [id number] Status is pending   Oral Oncology Clinic will continue to follow.  [name] [name] Phone [phone] Fax [phone] 11/24/2020 2:10 PM

## 2020-11-26 ENCOUNTER — Other Ambulatory Visit (HOSPITAL_COMMUNITY): Payer: Self-pay

## 2020-11-27 ENCOUNTER — Other Ambulatory Visit (HOSPITAL_COMMUNITY): Payer: Self-pay

## 2020-11-30 ENCOUNTER — Telehealth: Payer: Self-pay | Admitting: Medical Oncology

## 2020-11-30 NOTE — Telephone Encounter (Signed)
Lumakras start date-  Cindy [name] started [place] on 11/29/20.  I tried to leave a message to keep her appt on 10/18,but she does not have VM set up.

## 2020-12-04 ENCOUNTER — Other Ambulatory Visit (HOSPITAL_COMMUNITY): Payer: Self-pay

## 2020-12-07 NOTE — Progress Notes (Signed)
[facility] OFFICE PROGRESS NOTE  [name], MD [address] [place] [address]  DIAGNOSIS: Stage IV (T1b, N1, M1 a) non-small cell lung cancer favoring adenocarcinoma diagnosed in October 2021.  The pathologic immunohistochemical stains suggestive of upper GI primary but there is no finding on the PET scan or imaging study to support this possibility.   Biomarker Findings Microsatellite status - MS-Stable Tumor Mutational Burden - 3 Muts/Mb Genomic Findings For a complete list of the genes assayed, please refer to the Appendix. KRAS G12C STK11 E145* 7 Disease relevant genes with no reportable alterations: ALK, BRAF, EGFR, ERBB2, MET, RET, ROS1   PDL1 Expression 0%   PRIOR THERAPY: Systemic chemotherapy with carboplatin for AUC of 5, Alimta 500 mg/M2 and Keytruda 200 mg IV every 3 weeks.  First dose March 12, 2019.  Status post 12 cycles.  Starting from cycle #5 the patient will be on maintenance treatment with Alimta and Keytruda every 3 weeks.  Last dose of chemotherapy was given on 11/02/2020.  Discontinued secondary to disease progression.  CURRENT THERAPY: Lumakras (Sotorasib) 960 mg p.o. daily.  First dose on 11/29/20.    INTERVAL HISTORY: Cindy [name] 75 y.o. female returns to the clinic today for a follow-up visit.  The patient was recently found to have evidence of disease progression and her treatment was, therefore, switched to oral targeted treatment with Lumakras.  The patient had her first dose of treatment on 11/29/20 and has been tolerating it well without any known adverse side effects. She denies any recent fever, chills, or night sweats. She was previously seen by member the nutritionist team. She was scheduled to see them today; however, she is not going to the infusion room today. She got new protein bars that she likes. She tries to drink supplemental drinks. She lost two pounds since her last appointment.   She notes that she  has had some lightheadedness if she stands up too quickly for the last month or so. She takes BP medication at night. She states it sometimes may take a second or two to gain her balance before walking. She is trying to drink more water. Denies headaches. She denies changes with her balance except upon changing positions too quickly. No changes with speech or extremity weakness. She is seeing her eye doctor for some reported age related changes, particularly in there left eye.   She fell about 2 weeks ago and landed on her upper back. She notes some pain in this area but it is improving. At the worst, the pain is 6/10. With tylenol, her pain is manageable at a 3/10. If needed, she may take 1-2 Advil. She also has an old prescription for percocet but she is trying not to take this unless needed.  She has some baseline low back pain which she goes to the chiropractor for. Of note, she has some small metastatic bone disease in the left ischial, L3, and paramidline sacral area. She reports labored breathing with exertion and at baseline cough which is sometimes productive and other times nonproductive.  She will suck on halls if needed and bought an expectorant. She denies any chest pain except if coughing too much but denies hemoptysis. She denies any rashes or skin changes. She inquired about exercises since she is concerned she is getting deconditioned. She is here today for evaluation and for repeat blood work and to manage any adverse side effects of treatment.     MEDICAL HISTORY: Past  Medical History:  Diagnosis Date   Anxiety    Depression    Hyperlipidemia    Hypertension     ALLERGIES:  is allergic to escitalopram and procaine.  MEDICATIONS:  Current Outpatient Medications  Medication Sig Dispense Refill   ALPRAZolam (XANAX) 0.25 MG tablet Take 1 tablet by mouth once a day as needed 30 tablet 0   Ascorbic Acid (VITAMIN C PO) Take 1 tablet by mouth daily.     aspirin 81 MG tablet Take 81  mg by mouth daily.     b complex vitamins capsule Take 1 capsule by mouth 2 (two) times a week.     hydrochlorothiazide (HYDRODIURIL) 25 MG tablet Take 1 tablet (25 mg total) by mouth every morning. 90 tablet 1   HYDROcodone bit-homatropine (HYCODAN) 5-1.5 MG/5ML syrup Take 5 mLs by mouth every 6 hours as needed for cough. 120 mL 0   levothyroxine (SYNTHROID) 75 MCG tablet Take 1 tablet by mouth on an empty stomach once daily in the morning 30 tablet 12   lidocaine-prilocaine (EMLA) cream Apply to the Port-A-Cath site 30-60-minute before chemotherapy. 30 g 0   metoprolol succinate (TOPROL-XL) 50 MG 24 hr tablet Take 1 tablet (50 mg total) by mouth daily. 90 tablet 3   Multiple Minerals-Vitamins (CALCIUM-MAGNESIUM-ZINC-D3 PO) Take 1 tablet by mouth 2 (two) times a week.     Multiple Vitamin (MULTIVITAMIN) tablet Take 1 tablet by mouth 3 (three) times a week.     neomycin-polymyxin b-dexamethasone (MAXITROL) 3.5-10000-0.1 SUSP Place 1 drop into the both eyes 4 times a day 5 mL 0   polyethylene glycol (MIRALAX / GLYCOLAX) 17 g packet Take 17 g by mouth daily.     Polyvinyl Alcohol-Povidone (REFRESH OP) Place 1 drop into both eyes daily as needed (dry eyes).     sertraline (ZOLOFT) 50 MG tablet Take 1 tablet by mouth Once a day 30 tablet 12   simvastatin (ZOCOR) 20 MG tablet Take 1 tablet (20 mg total) by mouth every evening. 90 tablet 1   sotorasib (LUMAKRAS) 120 MG TABS Take 960 mg by mouth daily. 240 tablet 3   VITAMIN D PO Take 1 capsule by mouth 3 (three) times a week.     VITAMIN E PO Take 1 capsule by mouth 2 (two) times a week.     folic acid (FOLVITE) 1 MG tablet TAKE 1 TABLET(1 MG) BY MOUTH DAILY 30 tablet 1   prochlorperazine (COMPAZINE) 10 MG tablet TAKE 1 TABLET(10 MG) BY MOUTH EVERY 6 HOURS AS NEEDED FOR NAUSEA OR VOMITING (Patient not taking: Reported on 12/15/2020) 30 tablet 0   No current facility-administered medications for this visit.    SURGICAL HISTORY:  Past Surgical  History:  Procedure Laterality Date   DENTAL SURGERY  2012 - 2013   implants   IR IMAGING GUIDED PORT INSERTION  08/27/2020   IR THORACENTESIS ASP PLEURAL SPACE W/IMG GUIDE  12/19/2019   WISDOM TOOTH EXTRACTION  20    REVIEW OF SYSTEMS:   Review of Systems  Constitutional: Positive for baseline fatigue, decreased appetite, and 2 lb weight loss. Negative for chills and fever.  HENT: Negative for mouth sores, nosebleeds, sore throat and trouble swallowing.   Eyes: Negative for eye problems and icterus.  Respiratory: Positive for cough and shortness of breath with exertion. Negative for hemoptysis and wheezing.   Cardiovascular: Negative for chest pain and leg swelling.  Gastrointestinal: Positive for constipation. Negative for abdominal pain,  diarrhea, nausea and vomiting.  Genitourinary:  Negative for bladder incontinence, difficulty urinating, dysuria, frequency and hematuria.   Musculoskeletal: Positive for low back pain. Positive for improving but persistent thoracic and shoulder pain. Negative for gait problem, neck pain and neck stiffness.  Skin: Negative for itching and rash.  Neurological: Positive for intermittent lightheadedness with standing. Negative for dizziness, extremity weakness, gait problem, headaches,  and seizures.  Hematological: Negative for adenopathy. Does not bruise/bleed easily.  Psychiatric/Behavioral: Positive for depression. Negative for confusion, sleep disturbance. The patient is not nervous/anxious.     PHYSICAL EXAMINATION:  Blood pressure 119/68, pulse 83, temperature (!) 97 F (36.1 C), temperature source Tympanic, resp. rate 19, height $RemoveBe'5\' 4"'xbuJpAKcH$  (1.626 m), weight 170 lb 3.2 oz (77.2 kg), last menstrual period 09/29/2002, SpO2 96 %.  ECOG PERFORMANCE STATUS: 1  Physical Exam  Constitutional: Oriented to person, place, and time and well-developed, well-nourished, and in no distress.  HENT:  Head: Normocephalic and atraumatic.  Mouth/Throat: Oropharynx is  clear and moist. No oropharyngeal exudate.  Eyes: Conjunctivae are normal. Right eye exhibits no discharge. Left eye exhibits no discharge. No scleral icterus.  Neck: Normal range of motion. Neck supple.  Cardiovascular: Normal rate, regular rhythm, normal heart sounds and intact distal pulses.   Pulmonary/Chest: Effort normal and breath sounds normal. No respiratory distress. No wheezes. No rales.  Abdominal: Soft. Bowel sounds are normal. Exhibits no distension and no mass. There is no tenderness.  Musculoskeletal: Normal range of motion. Exhibits no edema. No tenderness to palpation over spine and upper back.  Lymphadenopathy:    No cervical adenopathy.  Neurological: Alert and oriented to person, place, and time. Exhibits normal muscle tone. Gait normal. Coordination normal.  Skin: Skin is warm and dry. No rash noted. Not diaphoretic. No erythema. No pallor.  Psychiatric: Mood, memory and judgment normal.  Vitals reviewed.  LABORATORY DATA: Lab Results  Component Value Date   WBC 5.1 12/15/2020   HGB 10.5 (L) 12/15/2020   HCT 30.5 (L) 12/15/2020   MCV 95.3 12/15/2020   PLT 298 12/15/2020      Chemistry      Component Value Date/Time   NA 137 12/15/2020 0931   K 3.6 12/15/2020 0931   CL 100 12/15/2020 0931   CO2 27 12/15/2020 0931   BUN 14 12/15/2020 0931   CREATININE 0.71 12/15/2020 0931      Component Value Date/Time   CALCIUM 9.5 12/15/2020 0931   ALKPHOS 131 (H) 12/15/2020 0931   AST 24 12/15/2020 0931   ALT 11 12/15/2020 0931   BILITOT 0.3 12/15/2020 0931       RADIOGRAPHIC STUDIES:  CT Chest W Contrast  Result Date: 11/20/2020 CLINICAL DATA:  Non-small-cell lung cancer.  Restaging. EXAM: CT CHEST, ABDOMEN, AND PELVIS WITH CONTRAST TECHNIQUE: Multidetector CT imaging of the chest, abdomen and pelvis was performed following the standard protocol during bolus administration of intravenous contrast. CONTRAST:  95mL OMNIPAQUE IOHEXOL 350 MG/ML SOLN COMPARISON:   09/18/2020 FINDINGS: CT CHEST FINDINGS Cardiovascular: The heart size is normal. Trace pericardial effusion. Coronary artery calcification is evident. Mild atherosclerotic calcification is noted in the wall of the thoracic aorta. Right Port-A-Cath tip is in the mid right atrium. Mediastinum/Nodes: Small mediastinal lymph nodes are similar to prior including 7 mm short axis right paratracheal node on 15/2. There is no hilar lymphadenopathy. Abnormal soft tissue attenuation in the left hilum is stable. The esophagus has normal imaging features. There is no axillary lymphadenopathy. Lungs/Pleura: Volume loss left hemithorax is stable. The circumferential irregular pleural thickening  in the left chest is not substantially changed although there is a general diffuse mild increase in pleural soft tissue thickening. Compare anteromedial left hemithorax on image 26/2 today to image 28/2 previously. Soft tissue thickening along the anterior mediastinum measures 8 mm today compared to 4 mm previously (remeasured). Similarly, soft tissue thickening in the left retro hilar region, at the level of the inferior left renal vein measures 13 mm today (31/2) compared to 8 mm previously (remeasured). Lateral pleural thickening on that same image measures 13 mm today compared to 9 mm previously (remeasured). 4 mm right upper lobe perifissural nodule on 57/4 was 2-3 mm previously (remeasured) subtle nodularity along the minor fissure is new in the interval. Tiny 3 mm posterior right lower lobe nodule on 70/4 is new. 3 mm right middle lobe nodule on 63/4 is new. Musculoskeletal: No worrisome lytic or sclerotic osseous abnormality. CT ABDOMEN PELVIS FINDINGS Hepatobiliary: No suspicious focal abnormality within the liver parenchyma. There is no evidence for gallstones, gallbladder wall thickening, or pericholecystic fluid. No intrahepatic or extrahepatic biliary dilation. Pancreas: No focal mass lesion. No dilatation of the main duct. No  intraparenchymal cyst. No peripancreatic edema. Spleen: No splenomegaly. No focal mass lesion. Adrenals/Urinary Tract: No adrenal nodule or mass. Kidneys unremarkable. No evidence for hydroureter. The urinary bladder appears normal for the degree of distention. Stomach/Bowel: Stomach is unremarkable. No gastric wall thickening. No evidence of outlet obstruction. Duodenum is normally positioned as is the ligament of Treitz. No small bowel wall thickening. No small bowel dilatation. The terminal ileum is normal. The appendix is normal. No gross colonic mass. No colonic wall thickening. Diverticular changes are noted in the left colon without evidence of diverticulitis. Vascular/Lymphatic: There is moderate atherosclerotic calcification of the abdominal aorta without aneurysm. There is no gastrohepatic or hepatoduodenal ligament lymphadenopathy. No retroperitoneal or mesenteric lymphadenopathy. No pelvic sidewall lymphadenopathy. Reproductive: The uterus is unremarkable.  There is no adnexal mass. Other: No intraperitoneal free fluid. Musculoskeletal: Interval progression of bony metastatic involvement. Index 5 mm left ischial lesion measured previously is 7 mm on 01/12/2 today. 12 mm left paramidline sacral lesion on 94/2 was 10 mm (remeasured) previously. 13 mm sclerotic lesion in the posterior L3 vertebral body (73/2) was 12 mm previously. IMPRESSION: 1. Generalized subtle progression of left pleural soft tissue thickening with similar appearance of circumferential irregular pleural thickening in the left hemithorax. 2. Persistent interstitial thickening consistent with lymphangitic tumor spread. 3. Similar appearance of small mediastinal lymph nodes. 4. Interval development of tiny right pulmonary nodules with subtle nodularity along the minor fissure. Imaging features concerning for metastatic disease. 5. Interval mild progression of bony metastatic involvement. 6. Aortic Atherosclerosis (ICD10-I70.0).  Electronically Signed   By: Misty Stanley M.D.   On: 11/20/2020 07:04   CT Abdomen Pelvis W Contrast  Result Date: 11/20/2020 CLINICAL DATA:  Non-small-cell lung cancer.  Restaging. EXAM: CT CHEST, ABDOMEN, AND PELVIS WITH CONTRAST TECHNIQUE: Multidetector CT imaging of the chest, abdomen and pelvis was performed following the standard protocol during bolus administration of intravenous contrast. CONTRAST:  74mL OMNIPAQUE IOHEXOL 350 MG/ML SOLN COMPARISON:  09/18/2020 FINDINGS: CT CHEST FINDINGS Cardiovascular: The heart size is normal. Trace pericardial effusion. Coronary artery calcification is evident. Mild atherosclerotic calcification is noted in the wall of the thoracic aorta. Right Port-A-Cath tip is in the mid right atrium. Mediastinum/Nodes: Small mediastinal lymph nodes are similar to prior including 7 mm short axis right paratracheal node on 15/2. There is no hilar lymphadenopathy. Abnormal soft tissue  attenuation in the left hilum is stable. The esophagus has normal imaging features. There is no axillary lymphadenopathy. Lungs/Pleura: Volume loss left hemithorax is stable. The circumferential irregular pleural thickening in the left chest is not substantially changed although there is a general diffuse mild increase in pleural soft tissue thickening. Compare anteromedial left hemithorax on image 26/2 today to image 28/2 previously. Soft tissue thickening along the anterior mediastinum measures 8 mm today compared to 4 mm previously (remeasured). Similarly, soft tissue thickening in the left retro hilar region, at the level of the inferior left renal vein measures 13 mm today (31/2) compared to 8 mm previously (remeasured). Lateral pleural thickening on that same image measures 13 mm today compared to 9 mm previously (remeasured). 4 mm right upper lobe perifissural nodule on 57/4 was 2-3 mm previously (remeasured) subtle nodularity along the minor fissure is new in the interval. Tiny 3 mm posterior  right lower lobe nodule on 70/4 is new. 3 mm right middle lobe nodule on 63/4 is new. Musculoskeletal: No worrisome lytic or sclerotic osseous abnormality. CT ABDOMEN PELVIS FINDINGS Hepatobiliary: No suspicious focal abnormality within the liver parenchyma. There is no evidence for gallstones, gallbladder wall thickening, or pericholecystic fluid. No intrahepatic or extrahepatic biliary dilation. Pancreas: No focal mass lesion. No dilatation of the main duct. No intraparenchymal cyst. No peripancreatic edema. Spleen: No splenomegaly. No focal mass lesion. Adrenals/Urinary Tract: No adrenal nodule or mass. Kidneys unremarkable. No evidence for hydroureter. The urinary bladder appears normal for the degree of distention. Stomach/Bowel: Stomach is unremarkable. No gastric wall thickening. No evidence of outlet obstruction. Duodenum is normally positioned as is the ligament of Treitz. No small bowel wall thickening. No small bowel dilatation. The terminal ileum is normal. The appendix is normal. No gross colonic mass. No colonic wall thickening. Diverticular changes are noted in the left colon without evidence of diverticulitis. Vascular/Lymphatic: There is moderate atherosclerotic calcification of the abdominal aorta without aneurysm. There is no gastrohepatic or hepatoduodenal ligament lymphadenopathy. No retroperitoneal or mesenteric lymphadenopathy. No pelvic sidewall lymphadenopathy. Reproductive: The uterus is unremarkable.  There is no adnexal mass. Other: No intraperitoneal free fluid. Musculoskeletal: Interval progression of bony metastatic involvement. Index 5 mm left ischial lesion measured previously is 7 mm on 01/12/2 today. 12 mm left paramidline sacral lesion on 94/2 was 10 mm (remeasured) previously. 13 mm sclerotic lesion in the posterior L3 vertebral body (73/2) was 12 mm previously. IMPRESSION: 1. Generalized subtle progression of left pleural soft tissue thickening with similar appearance of  circumferential irregular pleural thickening in the left hemithorax. 2. Persistent interstitial thickening consistent with lymphangitic tumor spread. 3. Similar appearance of small mediastinal lymph nodes. 4. Interval development of tiny right pulmonary nodules with subtle nodularity along the minor fissure. Imaging features concerning for metastatic disease. 5. Interval mild progression of bony metastatic involvement. 6. Aortic Atherosclerosis (ICD10-I70.0). Electronically Signed   By: Misty Stanley M.D.   On: 11/20/2020 07:04     ASSESSMENT/PLAN:  This is a very pleasant 75 year old Caucasian female diagnosed with stage IV (T1b, N2, M1 a) non-small cell lung cancer, favoring adenocarcinoma.  She has no actionable mutations based on her molecular studies by [facility].  She had a CT-guided biopsy of one of the left lung pleural-based nodules in which the final pathology was consistent with metastatic adenocarcinoma.  Molecular studies by [facility] showed positive for K-ras G 12 C mutation which is an option for targeted treatment in the second line setting with Lumakras (Sotorasib).  The patient previously underwent palliative systemic chemotherapy with carboplatin for an AUC of 5, Alimta 500 mg per metered squared, Keytruda 200 mg IV every 3 weeks.  She is status post 12 cycles.  Starting from cycle #5, the patient had been on maintenance treatment with Alimta and Keytruda IV every 3 weeks.  This was discontinued secondary to disease progression.  The patient is currently undergoing targeted treatment with Lumakras 960 mg p.o. daily.  She is status post 2 weeks of treatment.  Her first dose was on 11/29/20. She is tolerating this well without any known adverse side effects.   Labs were reviewed.  Recommend that she continue on the same treatment at the same dose.   We will see her back for follow-up visit in 2 weeks for evaluation and repeat blood work.  For the bilateral shoulder pain after  her fall, I offered her an x ray to ensure no fractures. She declined as her pain is improving.   She is also having some intermittent lightheadedness with standing. Denies any associated neurological complaints including headaches, equilibrium changes, speech changes, extremity weakness, etc. From the description, it sound consistent with orthostatic hypotension. Advised her to hydrate and change positions slower. Advised to check her BP during these times to ensure no hypotension, in which case, I would recommend she reach out to her prescribing provider for consideration of dose adjustment if low. I offered her a brain MRI due to the lightheadedness and the fact she has been having some visual changes with her left eye. Her last staging brain MRI was in 2021. She declined but she knows to reach out if worsening symptoms.   For the bone pain, she will continue to use tylenol and advil if needed. She has percocet in times where her pain is not controlled by her OTC medications.   She does not feel that she needs her nutrition appointment today. I will go ahead and cancel it. She was encouraged to continue to drink supplemental drinks.   She sought out the chaplin after her appointment today due to her depression. She is going to sign up for massage therapy. She takes zoloft for her depression which is prescribed by her PCP.   We will continue to monitor her TSH closely and make dose adjustments if necessary.   Encouraged water exercises, chair exercises, walking, or stationary bikes to increase her activity.   She is going to try Senakot for her constipation.   The patient was advised to call immediately if she has any concerning symptoms in the interval. The patient voices understanding of current disease status and treatment options and is in agreement with the current care plan. All questions were answered. The patient knows to call the clinic with any problems, questions or concerns. We can  certainly see the patient much sooner if necessary      Orders Placed This Encounter  Procedures   CBC with Differential ([facility] Only)    Standing Status:   Future    Standing Expiration Date:   12/15/2021   CMP ([facility] only)    Standing Status:   Future    Standing Expiration Date:   12/15/2021     The total time spent in the appointment was 30-39 minutes.   Nakeem Murnane L Seth Higginbotham, PA-C 12/15/20

## 2020-12-15 ENCOUNTER — Encounter: Payer: Self-pay | Admitting: General Practice

## 2020-12-15 ENCOUNTER — Inpatient Hospital Stay: Payer: Medicare PPO

## 2020-12-15 ENCOUNTER — Inpatient Hospital Stay: Payer: Medicare PPO | Attending: Internal Medicine | Admitting: Physician Assistant

## 2020-12-15 ENCOUNTER — Encounter: Payer: Self-pay | Admitting: Physician Assistant

## 2020-12-15 ENCOUNTER — Other Ambulatory Visit: Payer: Self-pay

## 2020-12-15 ENCOUNTER — Inpatient Hospital Stay: Payer: Medicare PPO | Admitting: Dietician

## 2020-12-15 VITALS — BP 119/68 | HR 83 | Temp 97.0°F | Resp 19 | Ht 64.0 in | Wt 170.2 lb

## 2020-12-15 DIAGNOSIS — Z79899 Other long term (current) drug therapy: Secondary | ICD-10-CM | POA: Insufficient documentation

## 2020-12-15 DIAGNOSIS — M25511 Pain in right shoulder: Secondary | ICD-10-CM | POA: Insufficient documentation

## 2020-12-15 DIAGNOSIS — F32A Depression, unspecified: Secondary | ICD-10-CM | POA: Insufficient documentation

## 2020-12-15 DIAGNOSIS — R42 Dizziness and giddiness: Secondary | ICD-10-CM | POA: Diagnosis not present

## 2020-12-15 DIAGNOSIS — M25112 Fistula, left shoulder: Secondary | ICD-10-CM | POA: Diagnosis not present

## 2020-12-15 DIAGNOSIS — C7951 Secondary malignant neoplasm of bone: Secondary | ICD-10-CM | POA: Diagnosis not present

## 2020-12-15 DIAGNOSIS — H539 Unspecified visual disturbance: Secondary | ICD-10-CM | POA: Diagnosis not present

## 2020-12-15 DIAGNOSIS — M545 Low back pain, unspecified: Secondary | ICD-10-CM | POA: Diagnosis not present

## 2020-12-15 DIAGNOSIS — C3492 Malignant neoplasm of unspecified part of left bronchus or lung: Secondary | ICD-10-CM

## 2020-12-15 DIAGNOSIS — C782 Secondary malignant neoplasm of pleura: Secondary | ICD-10-CM | POA: Diagnosis not present

## 2020-12-15 DIAGNOSIS — Z95828 Presence of other vascular implants and grafts: Secondary | ICD-10-CM

## 2020-12-15 LAB — CBC WITH DIFFERENTIAL (CANCER CENTER ONLY)
Abs Immature Granulocytes: 0.01 10*3/uL (ref 0.00–0.07)
Basophils Absolute: 0.1 10*3/uL (ref 0.0–0.1)
Basophils Relative: 1 %
Eosinophils Absolute: 0.1 10*3/uL (ref 0.0–0.5)
Eosinophils Relative: 2 %
HCT: 30.5 % — ABNORMAL LOW (ref 36.0–46.0)
Hemoglobin: 10.5 g/dL — ABNORMAL LOW (ref 12.0–15.0)
Immature Granulocytes: 0 %
Lymphocytes Relative: 16 %
Lymphs Abs: 0.8 10*3/uL (ref 0.7–4.0)
MCH: 32.8 pg (ref 26.0–34.0)
MCHC: 34.4 g/dL (ref 30.0–36.0)
MCV: 95.3 fL (ref 80.0–100.0)
Monocytes Absolute: 0.7 10*3/uL (ref 0.1–1.0)
Monocytes Relative: 13 %
Neutro Abs: 3.5 10*3/uL (ref 1.7–7.7)
Neutrophils Relative %: 68 %
Platelet Count: 298 10*3/uL (ref 150–400)
RBC: 3.2 MIL/uL — ABNORMAL LOW (ref 3.87–5.11)
RDW: 13.1 % (ref 11.5–15.5)
WBC Count: 5.1 10*3/uL (ref 4.0–10.5)
nRBC: 0 % (ref 0.0–0.2)

## 2020-12-15 LAB — CMP (CANCER CENTER ONLY)
ALT: 11 U/L (ref 0–44)
AST: 24 U/L (ref 15–41)
Albumin: 3.1 g/dL — ABNORMAL LOW (ref 3.5–5.0)
Alkaline Phosphatase: 131 U/L — ABNORMAL HIGH (ref 38–126)
Anion gap: 10 (ref 5–15)
BUN: 14 mg/dL (ref 8–23)
CO2: 27 mmol/L (ref 22–32)
Calcium: 9.5 mg/dL (ref 8.9–10.3)
Chloride: 100 mmol/L (ref 98–111)
Creatinine: 0.71 mg/dL (ref 0.44–1.00)
GFR, Estimated: >60 mL/min (ref >60)
Glucose, Bld: 103 mg/dL — ABNORMAL HIGH (ref 70–99)
Potassium: 3.6 mmol/L (ref 3.5–5.1)
Sodium: 137 mmol/L (ref 135–145)
Total Bilirubin: 0.3 mg/dL (ref 0.3–1.2)
Total Protein: 7.4 g/dL (ref 6.5–8.1)

## 2020-12-15 LAB — TSH: TSH: 4.3 u[IU]/mL — ABNORMAL HIGH (ref 0.308–3.960)

## 2020-12-15 MED ORDER — SODIUM CHLORIDE 0.9% FLUSH
10.0000 mL | Freq: Once | INTRAVENOUS | Status: AC
  Administered 2020-12-15: 10 mL

## 2020-12-15 MED ORDER — HEPARIN SOD (PORK) LOCK FLUSH 100 UNIT/ML IV SOLN
500.0000 [IU] | Freq: Once | INTRAVENOUS | Status: AC
  Administered 2020-12-15: 500 [IU]

## 2020-12-15 NOTE — Progress Notes (Signed)
[facility] Spiritual Care Note  Cindy [name] stopped by to let me know of her change from IV to oral treatment. She reports noticing no side effects and feeling quite good except for some apparently unrelated significant back pain in recent weeks. We signed her up for the [facility], and she plans to attend via Zoom today. We will keep in touch through the group, connecting 1:1 as needed.   [place], [place], [facility] Pager [phone] Voicemail [phone]

## 2020-12-18 ENCOUNTER — Other Ambulatory Visit (HOSPITAL_COMMUNITY): Payer: Self-pay

## 2020-12-21 ENCOUNTER — Other Ambulatory Visit: Payer: Self-pay | Admitting: Physician Assistant

## 2020-12-21 ENCOUNTER — Telehealth: Payer: Self-pay

## 2020-12-21 DIAGNOSIS — M546 Pain in thoracic spine: Secondary | ICD-10-CM

## 2020-12-21 NOTE — Telephone Encounter (Signed)
Pt LM stating she wanted to discuss some pain she has been having.  I have called the pt back and she states she continues to have lower abdominal, back, shoulder and leg pain since her fall and would like to have the xray offered to her at her last appt.  Discussed with Cassandra PA-C, who has placed this order. Pt is aware and has been advised to check in at [place] entrance in the Admissions department.

## 2020-12-22 ENCOUNTER — Other Ambulatory Visit: Payer: Self-pay

## 2020-12-22 ENCOUNTER — Telehealth: Payer: Self-pay | Admitting: Physician Assistant

## 2020-12-22 ENCOUNTER — Ambulatory Visit (HOSPITAL_COMMUNITY)
Admission: RE | Admit: 2020-12-22 | Discharge: 2020-12-22 | Disposition: A | Discharge status: Home / Self Care | Payer: Medicare PPO | Source: Ambulatory Visit | Attending: Physician Assistant | Admitting: Physician Assistant

## 2020-12-22 DIAGNOSIS — M47814 Spondylosis without myelopathy or radiculopathy, thoracic region: Secondary | ICD-10-CM | POA: Diagnosis not present

## 2020-12-22 DIAGNOSIS — M546 Pain in thoracic spine: Secondary | ICD-10-CM | POA: Diagnosis not present

## 2020-12-22 DIAGNOSIS — M25512 Pain in left shoulder: Secondary | ICD-10-CM | POA: Diagnosis not present

## 2020-12-22 DIAGNOSIS — M25511 Pain in right shoulder: Secondary | ICD-10-CM | POA: Diagnosis not present

## 2020-12-22 DIAGNOSIS — S32010A Wedge compression fracture of first lumbar vertebra, initial encounter for closed fracture: Secondary | ICD-10-CM | POA: Diagnosis not present

## 2020-12-22 NOTE — Telephone Encounter (Signed)
I called the patient to review the results of her xray. The scan did show a new compression fracture; however, the compression fracture is at L1. She is having pain in her upper back. She has been taking tylenol and advil. She has oxycodone which she reserves for if she has significant pain. She is able to manage her pain. We will see her as scheduled on 12/29/20. She had signed up for a free massage. In light of the new compression fracture, we would recommend on holding off on the massage for now. I left a voicemail about this but will try to call her back to let her know.

## 2020-12-23 DIAGNOSIS — I10 Essential (primary) hypertension: Secondary | ICD-10-CM | POA: Diagnosis not present

## 2020-12-23 DIAGNOSIS — E039 Hypothyroidism, unspecified: Secondary | ICD-10-CM | POA: Diagnosis not present

## 2020-12-23 DIAGNOSIS — F339 Major depressive disorder, recurrent, unspecified: Secondary | ICD-10-CM | POA: Diagnosis not present

## 2020-12-23 DIAGNOSIS — K5900 Constipation, unspecified: Secondary | ICD-10-CM | POA: Diagnosis not present

## 2020-12-24 ENCOUNTER — Other Ambulatory Visit: Payer: Self-pay

## 2020-12-24 ENCOUNTER — Emergency Department (HOSPITAL_BASED_OUTPATIENT_CLINIC_OR_DEPARTMENT_OTHER): Payer: Medicare PPO

## 2020-12-24 ENCOUNTER — Emergency Department (HOSPITAL_BASED_OUTPATIENT_CLINIC_OR_DEPARTMENT_OTHER)
Admission: EM | Admit: 2020-12-24 | Discharge: 2020-12-24 | Disposition: A | Discharge status: Home / Self Care | Payer: Medicare PPO | Attending: Emergency Medicine | Admitting: Emergency Medicine

## 2020-12-24 ENCOUNTER — Encounter (HOSPITAL_BASED_OUTPATIENT_CLINIC_OR_DEPARTMENT_OTHER): Payer: Self-pay | Admitting: *Deleted

## 2020-12-24 ENCOUNTER — Telehealth: Payer: Self-pay | Admitting: Medical Oncology

## 2020-12-24 ENCOUNTER — Telehealth: Payer: Self-pay | Admitting: Internal Medicine

## 2020-12-24 DIAGNOSIS — Z87891 Personal history of nicotine dependence: Secondary | ICD-10-CM | POA: Diagnosis not present

## 2020-12-24 DIAGNOSIS — W1839XA Other fall on same level, initial encounter: Secondary | ICD-10-CM | POA: Insufficient documentation

## 2020-12-24 DIAGNOSIS — Z79899 Other long term (current) drug therapy: Secondary | ICD-10-CM | POA: Insufficient documentation

## 2020-12-24 DIAGNOSIS — M25571 Pain in right ankle and joints of right foot: Secondary | ICD-10-CM | POA: Diagnosis not present

## 2020-12-24 DIAGNOSIS — Z85118 Personal history of other malignant neoplasm of bronchus and lung: Secondary | ICD-10-CM | POA: Diagnosis not present

## 2020-12-24 DIAGNOSIS — Z7982 Long term (current) use of aspirin: Secondary | ICD-10-CM | POA: Diagnosis not present

## 2020-12-24 DIAGNOSIS — I10 Essential (primary) hypertension: Secondary | ICD-10-CM | POA: Insufficient documentation

## 2020-12-24 DIAGNOSIS — S82841A Displaced bimalleolar fracture of right lower leg, initial encounter for closed fracture: Secondary | ICD-10-CM | POA: Insufficient documentation

## 2020-12-24 DIAGNOSIS — R9431 Abnormal electrocardiogram [ECG] [EKG]: Secondary | ICD-10-CM | POA: Diagnosis not present

## 2020-12-24 DIAGNOSIS — S8991XA Unspecified injury of right lower leg, initial encounter: Secondary | ICD-10-CM | POA: Diagnosis present

## 2020-12-24 NOTE — ED Triage Notes (Signed)
She passed out after getting up from a laying position. She fell. Injury to her right ankle. Her BP medication was changed by her MD yesterday due to frequent falls and dizziness. EKG at triage.

## 2020-12-24 NOTE — ED Notes (Signed)
Pt discharged to home. Discharge instructions have been discussed with patient and/or family members. Pt verbally acknowledges understanding d/c instructions, and endorses comprehension to checkout at registration before leaving. Pt instructed to be non weight bearing on Right foot

## 2020-12-24 NOTE — Telephone Encounter (Signed)
Scheduled per sch msg. Called and spoke with patient. Confirmed appt  

## 2020-12-24 NOTE — ED Provider Notes (Signed)
[facility] EMERGENCY DEPARTMENT Provider Note   CSN: [id number] Arrival date & time: 12/24/20  1324     History Chief Complaint  Patient presents with   Fall   Ankle Injury    Cindy [name] is a 75 y.o. female.  75 y.o female with a PMH of HTN, Anxiety presents to the ED with a chief complaint of right ankle pain s/p fall. This is patience second fall in the last month.  She reports walking her "older dog "around 5 AM in the morning, when she felt like she got up too quick, she had a sensation of feeling her legs were "very rubbery ", she then sat back down and attempted to stand back up again and held to the ground.  Does report she was out for a couple of seconds.  This is patient's second episode in the last month.  She called her cardiologist as she recently had a new medication added, her cardiologist discontinue her HCTZ.  He is followed by Dr. Earlie Server for her adenocarcinoma of the lung stage IV.  Recently began a new therapy, she is taking Luma Crest 8 pills daily.  Ports she feels overall generalized weakness before passing out, feels like she is unable to hold her weight with both of her legs.  He is currently not on any blood thinners, denies any headache, denies any head trauma, no other complaints.     The history is provided by the patient.  Fall This is a recurrent problem. Pertinent negatives include no chest pain, no abdominal pain and no shortness of breath.  Ankle Injury Pertinent negatives include no chest pain, no abdominal pain and no shortness of breath.      Past Medical History:  Diagnosis Date   Anxiety    Depression    Hyperlipidemia    Hypertension     Patient Active Problem List   Diagnosis Date Noted   Port-A-Cath in place 11/24/2020   Encounter for antineoplastic immunotherapy 03/02/2020   Adenocarcinoma of left lung, stage 4 ([place]) 01/17/2020   Goals of care, counseling/discussion 01/17/2020   Encounter for antineoplastic  chemotherapy 01/17/2020   HTN (hypertension) 04/27/2012   Other and unspecified hyperlipidemia 04/27/2012    Past Surgical History:  Procedure Laterality Date   DENTAL SURGERY  2012 - 2013   implants   IR IMAGING GUIDED PORT INSERTION  08/27/2020   IR THORACENTESIS ASP PLEURAL SPACE W/IMG GUIDE  12/19/2019   WISDOM TOOTH EXTRACTION  20     OB History     Gravida  0   Para  0   Term      Preterm      AB      Living         SAB      IAB      Ectopic      Multiple      Live Births              Family History  Problem Relation Age of Onset   Dementia Mother    COPD Mother    Lung cancer Maternal Grandmother    Cirrhosis Brother     Social History   Tobacco Use   Smoking status: Former    Packs/day: 2.00    Years: 15.00    Pack years: 30.00    Types: Cigarettes    Quit date: 1985    Years since quitting: 37.8   Smokeless tobacco: Never  Vaping Use  Vaping Use: Never used  Substance Use Topics   Alcohol use: Yes    Alcohol/week: 10.0 standard drinks    Types: 10 Standard drinks or equivalent per week   Drug use: No    Home Medications Prior to Admission medications   Medication Sig Start Date End Date Taking? Authorizing Provider  hydrochlorothiazide (HYDRODIURIL) 25 MG tablet Take 1 tablet (25 mg total) by mouth every morning. 03/11/20  Yes   ALPRAZolam (XANAX) 0.25 MG tablet Take 1 tablet by mouth once a day as needed 11/12/20     Ascorbic Acid (VITAMIN C PO) Take 1 tablet by mouth daily.    [provider]  aspirin 81 MG tablet Take 81 mg by mouth daily.    [provider]  b complex vitamins capsule Take 1 capsule by mouth 2 (two) times a week.    [provider]  folic acid (FOLVITE) 1 MG tablet TAKE 1 TABLET(1 MG) BY MOUTH DAILY 07/23/20   Curt Bears, MD  HYDROcodone bit-homatropine [facility]) 5-1.5 MG/5ML syrup Take 5 mLs by mouth every 6 hours as needed for cough. 07/24/20   Heilingoetter, Cassandra L,  PA-C  levothyroxine (SYNTHROID) 75 MCG tablet Take 1 tablet by mouth on an empty stomach once daily in the morning 09/09/20     lidocaine-prilocaine (EMLA) cream Apply to the Port-A-Cath site 30-60-minute before chemotherapy. 07/14/20   Curt Bears, MD  metoprolol succinate (TOPROL-XL) 50 MG 24 hr tablet Take 1 tablet (50 mg total) by mouth daily. 08/12/20     Multiple Minerals-Vitamins (CALCIUM-MAGNESIUM-ZINC-D3 PO) Take 1 tablet by mouth 2 (two) times a week.    [provider]  Multiple Vitamin (MULTIVITAMIN) tablet Take 1 tablet by mouth 3 (three) times a week.    [provider]  neomycin-polymyxin b-dexamethasone (MAXITROL) 3.5-10000-0.1 SUSP Place 1 drop into the both eyes 4 times a day 11/12/20     polyethylene glycol (MIRALAX / GLYCOLAX) 17 g packet Take 17 g by mouth daily.    [provider]  Polyvinyl Alcohol-Povidone (REFRESH OP) Place 1 drop into both eyes daily as needed (dry eyes).    [provider]  prochlorperazine (COMPAZINE) 10 MG tablet TAKE 1 TABLET(10 MG) BY MOUTH EVERY 6 HOURS AS NEEDED FOR NAUSEA OR VOMITING Patient not taking: Reported on 12/15/2020 03/23/20   Curt Bears, MD  sertraline (ZOLOFT) 50 MG tablet Take 1 tablet by mouth Once a day 09/09/20     simvastatin (ZOCOR) 20 MG tablet Take 1 tablet (20 mg total) by mouth every evening. 03/11/20     sotorasib (LUMAKRAS) 120 MG TABS Take 960 mg by mouth daily. 11/24/20   Curt Bears, MD  VITAMIN D PO Take 1 capsule by mouth 3 (three) times a week.    [provider]  VITAMIN E PO Take 1 capsule by mouth 2 (two) times a week.    [provider]  mirtazapine (REMERON) 30 MG tablet Take 1 tablet (30 mg total) by mouth at bedtime. 03/31/20 04/21/20  Curt Bears, MD    Allergies    Escitalopram and Procaine  Review of Systems   Review of Systems  Constitutional:  Negative for fever.  HENT:  Negative for sore throat.   Respiratory:  Negative for shortness  of breath.   Cardiovascular:  Negative for chest pain.  Gastrointestinal:  Negative for abdominal pain and diarrhea.  Genitourinary:  Negative for flank pain.  Musculoskeletal:  Positive for arthralgias.  Neurological:  Positive for weakness.  All  other systems reviewed and are negative.  Physical Exam Updated Vital Signs BP (!) 122/57 (BP Location: Right Arm)   Pulse 76   Temp 98.2 F (36.8 C) (Oral)   Resp 18   Ht 5\' 4"  (1.626 m)   Wt 77.2 kg   LMP 09/29/2002   SpO2 98%   BMI 29.21 kg/m   Physical Exam Vitals and nursing note reviewed.  Constitutional:      Appearance: Normal appearance. She is not ill-appearing.  HENT:     Head: Normocephalic and atraumatic.     Comments: No visible abrasion, or signs of facial injury noted.    Nose: Nose normal.     Mouth/Throat:     Mouth: Mucous membranes are moist.  Eyes:     Pupils: Pupils are equal, round, and reactive to light.  Cardiovascular:     Rate and Rhythm: Normal rate.  Pulmonary:     Effort: Pulmonary effort is normal.  Abdominal:     General: Abdomen is flat.     Tenderness: There is no abdominal tenderness. There is no right CVA tenderness or left CVA tenderness.  Musculoskeletal:     Cervical back: Normal range of motion and neck supple.  Skin:    General: Skin is warm and dry.  Neurological:     Mental Status: She is alert and oriented to person, place, and time.    ED Results / Procedures / Treatments   Labs (all labs ordered are listed, but only abnormal results are displayed) Labs Reviewed - No data to display  EKG EKG Interpretation  Date/Time:  Thursday December 24 2020 13:42:01 EDT Ventricular Rate:  94 PR Interval:  166 QRS Duration: 70 QT Interval:  346 QTC Calculation: 432 R Axis:   85 Text Interpretation: Normal sinus rhythm Cannot rule out Anterior infarct , age undetermined Abnormal ECG No significant change since last tracing Confirmed by Wandra Arthurs ([id number]) on 12/24/2020 5:21:49  PM  Radiology DG Ankle Complete Right  Result Date: 12/24/2020 CLINICAL DATA:  Right ankle pain after fall today. EXAM: RIGHT ANKLE - COMPLETE 3+ VIEW COMPARISON:  None. FINDINGS: Mildly displaced oblique fracture is seen involving the distal right fibula. Minimally displaced medial malleolar fracture is noted. IMPRESSION: Distal right tibial and fibular fractures as described above. Electronically Signed   By: Marijo Conception M.D.   On: 12/24/2020 14:31    Procedures Procedures   Medications Ordered in ED Medications - No data to display  ED Course  I have reviewed the triage vital signs and the nursing notes.  Pertinent labs & imaging results that were available during my care of the patient were reviewed by me and considered in my medical decision making (see chart for details).    MDM Rules/Calculators/A&P   Patient presents to the ED status post fall since yesterday morning.  This is a patient second fall of the month, does report increase in falls, suspect this is likely due to her blood pressure medication which was discontinued by her MD yesterday.  She is a cancer patient under Dr. Earlie Server, recently began Luma crest over a new sperm until chemotherapy.  She does have a new compression fracture that was diagnosed approximately 2 days ago.  Today's fall does not sound mechanical in nature, however when asked about getting labs in order to check for any electrolyte derangement, patient did have labs last week and had all levels within normal limits.  She is requesting the lab work on today's  visit.  I did suggest the patient placing on a splint along with crutches and follow-up with orthopedics.  However, patient is adamant of obtaining a boot, as she does have to walk her dogs.  She has been ambulatory on her fracture since yesterday morning.  In addition, I offered patient IV fluids, along with lab work in order to rule out any orthostatic hypotension, however patient reports that  she does not want this at this time and will refer to bypass IV placement.  X-ray of the right ankle shows: Mildly displaced oblique fracture is seen involving the distal right  fibula. Minimally displaced medial malleolar fracture is noted.   I discussed these results with Dr. Sammuel Hines who recommended splint along with office visit tomorrow morning.  She will also need crutches at this time.  Patient is adamant of boot, however she is agreeable of obtaining splint at this time.  Patient had splint placed, this consisted of a posterior short leg, along with a sterile.  She will be mobilizing with crutches without weightbearing.  She does have a prescription for narcotic pain medication at home due to her history of cancer, did not prescribe any additional medication at this time as she reports she does not needed at the time.  She is accompanied by friend at the bedside who reports she will help her getting around her home.  Patient discharged in stable condition.  I have discussed care with my attending Dr. Darl Householder who has seen patient and agrees with plan and management.   Portions of this note were generated with Lobbyist. Dictation errors may occur despite best attempts at proofreading.  Final Clinical Impression(s) / ED Diagnoses Final diagnoses:  Closed bimalleolar fracture of right ankle, initial encounter    Rx / DC Orders ED Discharge Orders     None        Janeece Fitting, PA-C 12/24/20 1801    Drenda Freeze, MD 12/24/20 [phone]

## 2020-12-24 NOTE — Telephone Encounter (Signed)
Pt fell again today after "blacking out" and she hurt her ankle.   She was taking her senior dog outside when it happened.  She fell in mid Oct. and she thinks she blacked out then.  HCTZ discontinued yesterday by Dr Lady Deutscher.  Per Cassie ,I instructed pt to go to [facility] ED. [place] is calling her friend now to take her.

## 2020-12-24 NOTE — Discharge Instructions (Addendum)
You were placed on a splint on today's visit, you will need to keep this in place until you follow-up with orthopedics.  Call orthopedist Dr. Sammuel Hines, in order to setup appointment tomorrow morning.   You will need to attempt to not walk on your right leg.

## 2020-12-28 ENCOUNTER — Ambulatory Visit (HOSPITAL_BASED_OUTPATIENT_CLINIC_OR_DEPARTMENT_OTHER)
Admission: RE | Admit: 2020-12-28 | Discharge: 2020-12-28 | Disposition: A | Discharge status: Home / Self Care | Payer: Medicare PPO | Source: Ambulatory Visit | Attending: Orthopaedic Surgery | Admitting: Orthopaedic Surgery

## 2020-12-28 ENCOUNTER — Other Ambulatory Visit (HOSPITAL_COMMUNITY): Payer: Self-pay

## 2020-12-28 ENCOUNTER — Other Ambulatory Visit: Payer: Self-pay

## 2020-12-28 ENCOUNTER — Other Ambulatory Visit (HOSPITAL_BASED_OUTPATIENT_CLINIC_OR_DEPARTMENT_OTHER): Payer: Self-pay | Admitting: Orthopaedic Surgery

## 2020-12-28 ENCOUNTER — Ambulatory Visit (INDEPENDENT_AMBULATORY_CARE_PROVIDER_SITE_OTHER): Payer: Medicare PPO | Admitting: Orthopaedic Surgery

## 2020-12-28 VITALS — Ht 64.0 in | Wt 170.0 lb

## 2020-12-28 DIAGNOSIS — S82841G Displaced bimalleolar fracture of right lower leg, subsequent encounter for closed fracture with delayed healing: Secondary | ICD-10-CM

## 2020-12-28 DIAGNOSIS — S82841A Displaced bimalleolar fracture of right lower leg, initial encounter for closed fracture: Secondary | ICD-10-CM | POA: Diagnosis not present

## 2020-12-28 DIAGNOSIS — S82301A Unspecified fracture of lower end of right tibia, initial encounter for closed fracture: Secondary | ICD-10-CM | POA: Diagnosis not present

## 2020-12-28 NOTE — Progress Notes (Signed)
Chief Complaint: right ankle fracture     History of Present Illness:    Cindy [name] is a 75 y.o. female with right ankle pain after a fall while cleaning up her dogs past.  This occurred on 12/24/2020.  Of note she did put weight on the ankle following that and subsequently presented to the emergency room.  She currently lives at home alone with help of friends who come to her house.  Denies any history of diabetes on insulin.  She does state that she was previously told she is prediabetic.  She is currently battling lung cancer.  She is on chemotherapy for this.    Surgical History:   None  PMH/PSH/Family History/Social History/Meds/Allergies:    Past Medical History:  Diagnosis Date   Anxiety    Depression    Hyperlipidemia    Hypertension    Past Surgical History:  Procedure Laterality Date   DENTAL SURGERY  2012 - 2013   implants   IR IMAGING GUIDED PORT INSERTION  08/27/2020   IR THORACENTESIS ASP PLEURAL SPACE W/IMG GUIDE  12/19/2019   WISDOM TOOTH EXTRACTION  20   Social History   Socioeconomic History   Marital status: Widowed    Spouse name: Not on file   Number of children: 0   Years of education: Not on file   Highest education level: Not on file  Occupational History    Employer: [employer]   Tobacco Use   Smoking status: Former    Packs/day: 2.00    Years: 15.00    Pack years: 30.00    Types: Cigarettes    Quit date: 1985    Years since quitting: 37.8   Smokeless tobacco: Never  Vaping Use   Vaping Use: Never used  Substance and Sexual Activity   Alcohol use: Yes    Alcohol/week: 10.0 standard drinks    Types: 10 Standard drinks or equivalent per week   Drug use: No   Sexual activity: Never    Birth control/protection: Abstinence, Post-menopausal  Other Topics Concern   Not on file  Social History Narrative   Not on file   Social Determinants of Health   Financial Resource Strain: Not on file   Food Insecurity: Not on file  Transportation Needs: Not on file  Physical Activity: Not on file  Stress: Not on file  Social Connections: Not on file   Family History  Problem Relation Age of Onset   Dementia Mother    COPD Mother    Lung cancer Maternal Grandmother    Cirrhosis Brother    Allergies  Allergen Reactions   Escitalopram      sweating, headaches Other reaction(s): sweating, headaches   Procaine Palpitations   Current Outpatient Medications  Medication Sig Dispense Refill   ALPRAZolam (XANAX) 0.25 MG tablet Take 1 tablet by mouth once a day as needed 30 tablet 0   Ascorbic Acid (VITAMIN C PO) Take 1 tablet by mouth daily.     aspirin 81 MG tablet Take 81 mg by mouth daily.     b complex vitamins capsule Take 1 capsule by mouth 2 (two) times a week.     folic acid (FOLVITE) 1 MG tablet TAKE 1 TABLET(1 MG) BY MOUTH DAILY 30 tablet 1   hydrochlorothiazide (HYDRODIURIL) 25 MG tablet Take 1  tablet (25 mg total) by mouth every morning. 90 tablet 1   HYDROcodone bit-homatropine (HYCODAN) 5-1.5 MG/5ML syrup Take 5 mLs by mouth every 6 hours as needed for cough. 120 mL 0   levothyroxine (SYNTHROID) 75 MCG tablet Take 1 tablet by mouth on an empty stomach once daily in the morning 30 tablet 12   lidocaine-prilocaine (EMLA) cream Apply to the Port-A-Cath site 30-60-minute before chemotherapy. 30 g 0   metoprolol succinate (TOPROL-XL) 50 MG 24 hr tablet Take 1 tablet (50 mg total) by mouth daily. 90 tablet 3   Multiple Minerals-Vitamins (CALCIUM-MAGNESIUM-ZINC-D3 PO) Take 1 tablet by mouth 2 (two) times a week.     Multiple Vitamin (MULTIVITAMIN) tablet Take 1 tablet by mouth 3 (three) times a week.     neomycin-polymyxin b-dexamethasone (MAXITROL) 3.5-10000-0.1 SUSP Place 1 drop into the both eyes 4 times a day 5 mL 0   polyethylene glycol (MIRALAX / GLYCOLAX) 17 g packet Take 17 g by mouth daily.     Polyvinyl Alcohol-Povidone (REFRESH OP) Place 1 drop into both eyes daily as  needed (dry eyes).     prochlorperazine (COMPAZINE) 10 MG tablet TAKE 1 TABLET(10 MG) BY MOUTH EVERY 6 HOURS AS NEEDED FOR NAUSEA OR VOMITING (Patient not taking: Reported on 12/15/2020) 30 tablet 0   sertraline (ZOLOFT) 50 MG tablet Take 1 tablet by mouth Once a day 30 tablet 12   simvastatin (ZOCOR) 20 MG tablet Take 1 tablet (20 mg total) by mouth every evening. 90 tablet 1   sotorasib (LUMAKRAS) 120 MG TABS Take 8 tablets (960 mg) by mouth daily. 240 tablet 3   VITAMIN D PO Take 1 capsule by mouth 3 (three) times a week.     VITAMIN E PO Take 1 capsule by mouth 2 (two) times a week.     No current facility-administered medications for this visit.   No results found.  Review of Systems:   A ROS was performed including pertinent positives and negatives as documented in the HPI.  Physical Exam :   Constitutional: NAD and appears stated age Neurological: Alert and oriented Psych: Appropriate affect and cooperative Height 5\' 4"  (1.626 m), weight 170 lb (77.1 kg), last menstrual period 09/29/2002.   Comprehensive Musculoskeletal Exam:    Splint is in place.  Tenderness to palpation about medial lateral ankle.  Sensation is intact in all toes.  Toes are warm well perfused.  Imaging:   Xray (3 views right ankle, 2 views tib-fib): Bimalleolar ankle fracture with minimal displacement   I personally reviewed and interpreted the radiographs.   Assessment:   75 year old female with right bimalleolar ankle fracture after a fall.  She has been putting weight on it without any interval displacement.  I advised that while typically these would be operative fractures, given the fact that she is not able to leave her home at this time I would recommend that we replace the cast with a x-ray in the cast.  I will follow her at 2 weeks intervals to assess for routine healing.  Plan :    -Short leg cast placed today -Post cast x-rays today -Return to clinic in 2 weeks for additional  x-rays     I personally saw and evaluated the patient, and participated in the management and treatment plan.  Vanetta Mulders, MD Attending Physician, Orthopedic Surgery  This document was dictated using Dragon voice recognition software. A reasonable attempt at proof reading has been made to minimize errors.

## 2020-12-29 ENCOUNTER — Inpatient Hospital Stay: Payer: Medicare PPO | Admitting: Internal Medicine

## 2020-12-29 ENCOUNTER — Inpatient Hospital Stay: Payer: Medicare PPO | Attending: Internal Medicine

## 2020-12-29 ENCOUNTER — Other Ambulatory Visit (HOSPITAL_COMMUNITY): Payer: Self-pay

## 2020-12-29 VITALS — BP 117/55 | HR 83 | Temp 98.7°F | Resp 17

## 2020-12-29 DIAGNOSIS — K5900 Constipation, unspecified: Secondary | ICD-10-CM | POA: Diagnosis not present

## 2020-12-29 DIAGNOSIS — R5383 Other fatigue: Secondary | ICD-10-CM | POA: Insufficient documentation

## 2020-12-29 DIAGNOSIS — C3492 Malignant neoplasm of unspecified part of left bronchus or lung: Secondary | ICD-10-CM | POA: Diagnosis not present

## 2020-12-29 DIAGNOSIS — M545 Low back pain, unspecified: Secondary | ICD-10-CM | POA: Insufficient documentation

## 2020-12-29 DIAGNOSIS — E039 Hypothyroidism, unspecified: Secondary | ICD-10-CM | POA: Insufficient documentation

## 2020-12-29 DIAGNOSIS — F32A Depression, unspecified: Secondary | ICD-10-CM | POA: Insufficient documentation

## 2020-12-29 DIAGNOSIS — M79651 Pain in right thigh: Secondary | ICD-10-CM | POA: Diagnosis not present

## 2020-12-29 DIAGNOSIS — Z95828 Presence of other vascular implants and grafts: Secondary | ICD-10-CM

## 2020-12-29 DIAGNOSIS — Z5111 Encounter for antineoplastic chemotherapy: Secondary | ICD-10-CM

## 2020-12-29 DIAGNOSIS — H814 Vertigo of central origin: Secondary | ICD-10-CM | POA: Diagnosis not present

## 2020-12-29 LAB — CMP (CANCER CENTER ONLY)
ALT: 79 U/L — ABNORMAL HIGH (ref 0–44)
AST: 60 U/L — ABNORMAL HIGH (ref 15–41)
Albumin: 2.8 g/dL — ABNORMAL LOW (ref 3.5–5.0)
Alkaline Phosphatase: 389 U/L — ABNORMAL HIGH (ref 38–126)
Anion gap: 9 (ref 5–15)
BUN: 14 mg/dL (ref 8–23)
CO2: 27 mmol/L (ref 22–32)
Calcium: 8.4 mg/dL — ABNORMAL LOW (ref 8.9–10.3)
Chloride: 95 mmol/L — ABNORMAL LOW (ref 98–111)
Creatinine: 0.68 mg/dL (ref 0.44–1.00)
GFR, Estimated: >60 mL/min (ref >60)
Glucose, Bld: 124 mg/dL — ABNORMAL HIGH (ref 70–99)
Potassium: 3.4 mmol/L — ABNORMAL LOW (ref 3.5–5.1)
Sodium: 131 mmol/L — ABNORMAL LOW (ref 135–145)
Total Bilirubin: 0.6 mg/dL (ref 0.3–1.2)
Total Protein: 7 g/dL (ref 6.5–8.1)

## 2020-12-29 LAB — CBC WITH DIFFERENTIAL (CANCER CENTER ONLY)
Abs Immature Granulocytes: 0.01 10*3/uL (ref 0.00–0.07)
Basophils Absolute: 0 10*3/uL (ref 0.0–0.1)
Basophils Relative: 1 %
Eosinophils Absolute: 0.1 10*3/uL (ref 0.0–0.5)
Eosinophils Relative: 1 %
HCT: 27.3 % — ABNORMAL LOW (ref 36.0–46.0)
Hemoglobin: 9.5 g/dL — ABNORMAL LOW (ref 12.0–15.0)
Immature Granulocytes: 0 %
Lymphocytes Relative: 22 %
Lymphs Abs: 1.1 10*3/uL (ref 0.7–4.0)
MCH: 32.2 pg (ref 26.0–34.0)
MCHC: 34.8 g/dL (ref 30.0–36.0)
MCV: 92.5 fL (ref 80.0–100.0)
Monocytes Absolute: 0.8 10*3/uL (ref 0.1–1.0)
Monocytes Relative: 16 %
Neutro Abs: 3 10*3/uL (ref 1.7–7.7)
Neutrophils Relative %: 60 %
Platelet Count: 265 10*3/uL (ref 150–400)
RBC: 2.95 MIL/uL — ABNORMAL LOW (ref 3.87–5.11)
RDW: 12.6 % (ref 11.5–15.5)
WBC Count: 5 10*3/uL (ref 4.0–10.5)
nRBC: 0 % (ref 0.0–0.2)

## 2020-12-29 MED ORDER — HEPARIN SOD (PORK) LOCK FLUSH 100 UNIT/ML IV SOLN
500.0000 [IU] | Freq: Once | INTRAVENOUS | Status: AC
  Administered 2020-12-29: 500 [IU]

## 2020-12-29 MED ORDER — SODIUM CHLORIDE 0.9% FLUSH
10.0000 mL | Freq: Once | INTRAVENOUS | Status: AC
  Administered 2020-12-29: 10 mL

## 2020-12-29 NOTE — Progress Notes (Signed)
Cindy [name]   [phone]  OFFICE PROGRESS NOTE  Cindy Pillar, MD [address] [employer] [address] [place] [address]  DIAGNOSIS: stage IV (T1b, N1, M1 a) non-small cell lung cancer favoring adenocarcinoma diagnosed in October 2021.  The pathologic immunohistochemical stains suggestive of upper GI primary but there is no finding on the PET scan or imaging study to support this possibility.  Biomarker Findings Microsatellite status - MS-Stable Tumor Mutational Burden - 3 Muts/Mb Genomic Findings For a complete list of the genes assayed, please refer to the Appendix. KRAS G12C STK11 E145* 7 Disease relevant genes with no reportable alterations: ALK, BRAF, EGFR, ERBB2, MET, RET, ROS1  PDL1 Expression 0%  PRIOR THERAPY: Systemic chemotherapy with carboplatin for AUC of 5, Alimta 500 mg/M2 and Keytruda 200 mg IV every 3 weeks.  First dose March 12, 2019.  Status post 12 cycles.  Starting from cycle #5 the patient will be on maintenance treatment with Alimta and Keytruda every 3 weeks.  Last dose of chemotherapy was given on 11/02/2020.  Discontinued secondary to disease progression.  CURRENT THERAPY: Lumakras (Sotorasib) 960 mg p.o. daily.  First dose started November 29, 2020.  INTERVAL HISTORY: Cindy [name] 75 y.o. female returns to the clinic today for follow-up visit accompanied by her friend.  The patient is feeling fine today with no concerning complaints except for generalized fatigue as well as dizzy spells.  She has a lot of diarrhea after starting her treatment with Lumakras (Sotorasib) at least 3 times a day she was taking Imodium.  She also has a fall recently and was seen by orthopedic surgery and she was found on imaging studies to have fractures in the distal right tibia and fibula with no significant change in the alignment.  She currently has a right leg cast.  She denied having any current chest pain but has shortness of  breath with exertion with no cough or hemoptysis.  She denied having any fever or chills.  She has no nausea, vomiting but continues to have few episodes of diarrhea with no constipation.  She stopped taking her Lumakras (Sotorasib) since March 28, 2020.  The patient is here today for evaluation and repeat blood work.   MEDICAL HISTORY: Past Medical History:  Diagnosis Date   Anxiety    Depression    Hyperlipidemia    Hypertension     ALLERGIES:  is allergic to escitalopram and procaine.  MEDICATIONS:  Current Outpatient Medications  Medication Sig Dispense Refill   ALPRAZolam (XANAX) 0.25 MG tablet Take 1 tablet by mouth once a day as needed 30 tablet 0   Ascorbic Acid (VITAMIN C PO) Take 1 tablet by mouth daily.     aspirin 81 MG tablet Take 81 mg by mouth daily.     b complex vitamins capsule Take 1 capsule by mouth 2 (two) times a week.     folic acid (FOLVITE) 1 MG tablet TAKE 1 TABLET(1 MG) BY MOUTH DAILY 30 tablet 1   hydrochlorothiazide (HYDRODIURIL) 25 MG tablet Take 1 tablet (25 mg total) by mouth every morning. 90 tablet 1   HYDROcodone bit-homatropine (HYCODAN) 5-1.5 MG/5ML syrup Take 5 mLs by mouth every 6 hours as needed for cough. 120 mL 0   levothyroxine (SYNTHROID) 75 MCG tablet Take 1 tablet by mouth on an empty stomach once daily in the morning 30 tablet 12   lidocaine-prilocaine (EMLA) cream Apply to the Port-A-Cath site 30-60-minute before chemotherapy.  30 g 0   metoprolol succinate (TOPROL-XL) 50 MG 24 hr tablet Take 1 tablet (50 mg total) by mouth daily. 90 tablet 3   Multiple Minerals-Vitamins (CALCIUM-MAGNESIUM-ZINC-D3 PO) Take 1 tablet by mouth 2 (two) times a week.     Multiple Vitamin (MULTIVITAMIN) tablet Take 1 tablet by mouth 3 (three) times a week.     neomycin-polymyxin b-dexamethasone (MAXITROL) 3.5-10000-0.1 SUSP Place 1 drop into the both eyes 4 times a day 5 mL 0   polyethylene glycol (MIRALAX / GLYCOLAX) 17 g packet Take 17 g by mouth daily.      Polyvinyl Alcohol-Povidone (REFRESH OP) Place 1 drop into both eyes daily as needed (dry eyes).     prochlorperazine (COMPAZINE) 10 MG tablet TAKE 1 TABLET(10 MG) BY MOUTH EVERY 6 HOURS AS NEEDED FOR NAUSEA OR VOMITING (Patient not taking: Reported on 12/15/2020) 30 tablet 0   sertraline (ZOLOFT) 50 MG tablet Take 1 tablet by mouth Once a day 30 tablet 12   simvastatin (ZOCOR) 20 MG tablet Take 1 tablet (20 mg total) by mouth every evening. 90 tablet 1   sotorasib (LUMAKRAS) 120 MG TABS Take 8 tablets (960 mg) by mouth daily. 240 tablet 3   VITAMIN D PO Take 1 capsule by mouth 3 (three) times a week.     VITAMIN E PO Take 1 capsule by mouth 2 (two) times a week.     No current facility-administered medications for this visit.    SURGICAL HISTORY:  Past Surgical History:  Procedure Laterality Date   DENTAL SURGERY  2012 - 2013   implants   IR IMAGING GUIDED PORT INSERTION  08/27/2020   IR THORACENTESIS ASP PLEURAL SPACE W/IMG GUIDE  12/19/2019   WISDOM TOOTH EXTRACTION  20    REVIEW OF SYSTEMS:  Constitutional: positive for fatigue Eyes: negative Ears, nose, mouth, throat, and face: negative Respiratory: positive for dyspnea on exertion Cardiovascular: negative Gastrointestinal: positive for diarrhea Genitourinary:negative Integument/breast: negative Hematologic/lymphatic: negative Musculoskeletal:positive for bone pain Neurological: positive for dizziness Behavioral/Psych: negative Endocrine: negative Allergic/Immunologic: negative   PHYSICAL EXAMINATION: General appearance: alert, cooperative, fatigued, and no distress Head: Normocephalic, without obvious abnormality, atraumatic Neck: no adenopathy, no JVD, supple, symmetrical, trachea midline, and thyroid not enlarged, symmetric, no tenderness/mass/nodules Lymph nodes: Cervical, supraclavicular, and axillary nodes normal. Resp: clear to auscultation bilaterally Back: symmetric, no curvature. ROM normal. No CVA  tenderness. Cardio: regular rate and rhythm, S1, S2 normal, no murmur, click, rub or gallop GI: soft, non-tender; bowel sounds normal; no masses,  no organomegaly Extremities: She has a cast on the right leg. Neurologic: Alert and oriented X 3, normal strength and tone. Normal symmetric reflexes. Normal coordination and gait  ECOG PERFORMANCE STATUS: 1 - Symptomatic but completely ambulatory  Blood pressure (!) 117/55, pulse 83, temperature 98.7 F (37.1 C), resp. rate 17, last menstrual period 09/29/2002, SpO2 98 %.  LABORATORY DATA: Lab Results  Component Value Date   WBC 5.0 12/29/2020   HGB 9.5 (L) 12/29/2020   HCT 27.3 (L) 12/29/2020   MCV 92.5 12/29/2020   PLT 265 12/29/2020      Chemistry      Component Value Date/Time   NA 131 (L) 12/29/2020 1457   K 3.4 (L) 12/29/2020 1457   CL 95 (L) 12/29/2020 1457   CO2 27 12/29/2020 1457   BUN 14 12/29/2020 1457   CREATININE 0.68 12/29/2020 1457      Component Value Date/Time   CALCIUM 8.4 (L) 12/29/2020 1457   ALKPHOS 389 (  H) 12/29/2020 1457   AST 60 (H) 12/29/2020 1457   ALT 79 (H) 12/29/2020 1457   BILITOT 0.6 12/29/2020 1457       RADIOGRAPHIC STUDIES: DG Thoracic Spine 2 View  Result Date: 12/22/2020 CLINICAL DATA:  patient fall on upper back. Complaining of pain. r/o compression fracture. Pain bilateral shoulder pain. EXAM: THORACIC SPINE 2 VIEWS COMPARISON:  CT 11/19/2020 FINDINGS: There is an L1 compression fracture with approximately 15% height loss. There are multiple sclerotic lesions consistent with known metastatic disease. Thoracic spondylosis. Chest port catheter tip overlies the distal superior vena cava. IMPRESSION: L1 compression deformity with approximately 15% height loss. Known sclerotic osseous metastases. Electronically Signed   By: Maurine Simmering M.D.   On: 12/22/2020 15:45   DG Tibia/Fibula Right  Result Date: 12/28/2020 CLINICAL DATA:  Fracture tibia and fibula EXAM: RIGHT TIBIA AND FIBULA - 2 VIEW  COMPARISON:  Study done on 12/24/2020 FINDINGS: There is oblique, essentially undisplaced fracture in the distal shaft of fibula. There is fracture in the base of medial malleolus. There is no significant displacement of the fracture fragments. Plantar spur is seen in calcaneus. There are small bony spurs in the dorsal aspect of intertarsal joints. IMPRESSION: Fractures are seen in the distal right tibia and fibula with no significant change in alignment. Electronically Signed   By: Elmer Picker M.D.   On: 12/28/2020 16:49   DG Ankle Complete Right  Result Date: 12/28/2020 CLINICAL DATA:  Status post casting of by malleolar fracture. EXAM: RIGHT ANKLE - COMPLETE 3+ VIEW COMPARISON:  Earlier radiograph dated 12/28/2020. FINDINGS: Minimally displaced oblique fracture of the lateral malleolus and nondisplaced fracture of the medial malleolus. No dislocation. A cast is noted. IMPRESSION: Mildly displaced bimalleolar fracture status post cast placement. Electronically Signed   By: Anner Crete M.D.   On: 12/28/2020 20:37   DG Ankle Complete Right  Result Date: 12/24/2020 CLINICAL DATA:  Right ankle pain after fall today. EXAM: RIGHT ANKLE - COMPLETE 3+ VIEW COMPARISON:  None. FINDINGS: Mildly displaced oblique fracture is seen involving the distal right fibula. Minimally displaced medial malleolar fracture is noted. IMPRESSION: Distal right tibial and fibular fractures as described above. Electronically Signed   By: Marijo Conception M.D.   On: 12/24/2020 14:31     ASSESSMENT AND PLAN: This is a very pleasant 75 years old white female recently diagnosed with a stage IV (T1b, N2, M1 a) non-small cell lung cancer, adenocarcinoma with no actionable mutations based on the molecular studies by [facility]. The patient underwent CT-guided core biopsy of one of the left lung pleural-based nodules by interventional radiology and the final pathology was consistent with metastatic adenocarcinoma. Her  [facility] molecular studies and PD-L1 expression.  The molecular studies by [facility] showed positive K-ras G12C mutation which will be an option for targeted therapy in the second line setting with Lumakras (Sotorasib). The patient underwent palliative systemic chemotherapy with carboplatin for AUC of 5, Alimta 500 mg/M2 and Keytruda 200 mg IV every 3 weeks.  Status post 12 cycles.  Starting from cycle #5 the patient will be on maintenance treatment with Alimta and Keytruda every 3 weeks. The patient has been tolerating her treatment well except for the increasing fatigue and also worsening back pain recently. Unfortunately her scan after cycle #12 showed evidence for disease progression of the left pleural soft tissue thickening as well as interstitial thickening consistent with lymphangitic tumor spread and worsening bone  metastasis. The patient is started treatment with Lumakras (Sotorasib) 960 mg p.o. daily on November 29, 2020.  She has significant diarrhea with several times daily since starting Lumakras (Sotorasib).  She also has been complaining of increasing fatigue and weakness.  She stopped taking Lumakras (Sotorasib) on December 26, 2020.  She is feeling a little bit better today. I had a lengthy discussion with the patient and her friend today about her current condition and treatment options. I agreed with the patient holding her Lumakras (Sotorasib) for at least 1 week especially with the increase in her liver enzymes and the persistent diarrhea. I will see her back for follow-up visit in 1 week for evaluation and repeat blood work and if she is feeling much better we can resume her treatment with Lumakras (Sotorasib) but at a reduced dose of 480 mg p.o. daily. For the frequent dizzy spells, my plan was to repeat MRI of the brain to rule out brain metastasis but the patient mentions that she would not tolerate laying on the table for the MRI and she is  agreeable for CT scan of the head with and without contrast taken into consideration its limitation. For the hypothyroidism, she will continue her current treatment with levothyroxine. For the recent right leg fracture, she is followed by orthopedic surgery and she has a cast on her leg. For depression she will continue her current treatment with Prozac. The patient was advised to call immediately if she has any other concerning symptoms in the interval.  The patient voices understanding of current disease status and treatment options and is in agreement with the current care plan.  All questions were answered. The patient knows to call the clinic with any problems, questions or concerns. We can certainly see the patient much sooner if necessary.  Disclaimer: This note was dictated with voice recognition software. Similar sounding words can inadvertently be transcribed and may not be corrected upon review.

## 2020-12-30 ENCOUNTER — Other Ambulatory Visit: Payer: Medicare PPO

## 2020-12-30 ENCOUNTER — Ambulatory Visit: Payer: Medicare PPO | Admitting: Physician Assistant

## 2020-12-30 NOTE — Progress Notes (Signed)
[facility] OFFICE PROGRESS NOTE  [name], MD [address] [place] [address]  DIAGNOSIS: Stage IV (T1b, N1, M1 a) non-small cell lung cancer favoring adenocarcinoma diagnosed in October 2021.  The pathologic immunohistochemical stains suggestive of upper GI primary but there is no finding on the PET scan or imaging study to support this possibility.   Biomarker Findings Microsatellite status - MS-Stable Tumor Mutational Burden - 3 Muts/Mb Genomic Findings For a complete list of the genes assayed, please refer to the Appendix. KRAS G12C STK11 E145* 7 Disease relevant genes with no reportable alterations: ALK, BRAF, EGFR, ERBB2, MET, RET, ROS1   PRIOR THERAPY:  1) Systemic chemotherapy with carboplatin for AUC of 5, Alimta 500 mg/M2 and Keytruda 200 mg IV every 3 weeks.  First dose March 12, 2019.  Status post 12 cycles.  Starting from cycle #5 the patient will be on maintenance treatment with Alimta and Keytruda every 3 weeks.  Last dose of chemotherapy was given on 11/02/2020.  Discontinued secondary to disease progression. 2) Lumakras (Sotorasib) 960 mg p.o. daily.  First dose on 11/29/20.  Dose held starting 12/26/20.  Discontinued due to patient request.  CURRENT THERAPY: None   INTERVAL HISTORY: Cindy [name] 75 y.o. female returns to the clinic today for a follow-up visit accompanied by her friend. She was last seen in the clinic on 12/29/20. She had some mild elevated LFTs as well as diarrhea and generalized fatigue and weakness since starting lumakras. She also had some dizzy spells. She has been holding her lumakras since 12/26/20. Dr. Julien Nordmann ordered a head CT to rule out metastatic disease to the brain.  This was performed on 01/01/21. CT head due to intolerance with brain MRI laying on the table with her back..  This was negative for any metastatic disease to the brain.  Her dizzy spells have improved since she has been holding her HCTZ.   She had about 3 falls that had occurred over the last month or so.   Overall, many of her symptoms improved since holding her Lumakras. She is having constipation, likely from her occasional pain medication use.  She is taking colace for constipation once a day. Her last bowel movement was on Thursday last week.She has been taking tylenol for pain in her low back. She also has been having some pain in her right thigh. She has taken oxycodone 0-2x per day for pain if needed for breakthrough pain. She states about 10 years ago she has a history of a bulging disc, however, it improved with a chiropractor. She now has been having progressive pain in this area and was thinking of going to see her chiropractor regarding this.  She states that she felt a "pop" in her back on Saturday after bending down to reach for her cane; although, she has been endorsing progressive low back pain at the last few encounters I have had with her which precedes 01/02/2021.   Her dizzy spells have improved as well since she was taken off HCTZ. She still is generally weak and not very active. She has a cast on her right foot since her fracture after her fall. She sees her orthopedic physician next on 01/14/21. She is expected to start physical therapy soon. She denies any fever, chills, or night sweats. She has occasional nausea for which she takes compazine.  She denies vomiting. Denies any headache or visual changes. She reports labored breathing with exertion which has worsening slowly over  the last few weeks. She has a history of a pleural effusion but does not feel like she needs to have it drained at this time. She has a cough associated with a dry mouth.  She will suck on halls  and drink plenty of fluid. She denies any chest pain and denies hemoptysis. She denies any rashes or skin changes.   She has been wary of restaging treatment with Lumakras. She is here for evaluation and repeat blood work and for a more detailed discussion  about her current condition and options.Marland Kitchen     MEDICAL HISTORY: Past Medical History:  Diagnosis Date   Anxiety    Depression    Hyperlipidemia    Hypertension     ALLERGIES:  is allergic to escitalopram, povidone iodine, and procaine.  MEDICATIONS:  Current Outpatient Medications  Medication Sig Dispense Refill   ALPRAZolam (XANAX) 0.25 MG tablet Take 1 tablet by mouth once a day as needed 30 tablet 0   Ascorbic Acid (VITAMIN C PO) Take 1 tablet by mouth daily.     aspirin 81 MG tablet Take 81 mg by mouth daily.     b complex vitamins capsule Take 1 capsule by mouth 2 (two) times a week.     HYDROcodone bit-homatropine (HYCODAN) 5-1.5 MG/5ML syrup Take 5 mLs by mouth every 6 hours as needed for cough. 120 mL 0   levothyroxine (SYNTHROID) 75 MCG tablet Take 1 tablet by mouth on an empty stomach once daily in the morning 30 tablet 12   lidocaine-prilocaine (EMLA) cream Apply to the Port-A-Cath site 30-60-minute before chemotherapy. 30 g 0   metoprolol succinate (TOPROL-XL) 50 MG 24 hr tablet Take 1 tablet (50 mg total) by mouth daily. 90 tablet 3   Multiple Minerals-Vitamins (CALCIUM-MAGNESIUM-ZINC-D3 PO) Take 1 tablet by mouth 2 (two) times a week.     Multiple Vitamin (MULTIVITAMIN) tablet Take 1 tablet by mouth 3 (three) times a week.     neomycin-polymyxin b-dexamethasone (MAXITROL) 3.5-10000-0.1 SUSP Place 1 drop into the both eyes 4 times a day 5 mL 0   oxyCODONE (ROXICODONE) 5 MG immediate release tablet Take 1 tablet by mouth every 4 hours as needed for severe pain. 40 tablet 0   polyethylene glycol (MIRALAX / GLYCOLAX) 17 g packet Take 17 g by mouth daily.     Polyvinyl Alcohol-Povidone (REFRESH OP) Place 1 drop into both eyes daily as needed (dry eyes).     prochlorperazine (COMPAZINE) 10 MG tablet TAKE 1 TABLET(10 MG) BY MOUTH EVERY 6 HOURS AS NEEDED FOR NAUSEA OR VOMITING 30 tablet 0   sertraline (ZOLOFT) 50 MG tablet Take 1 tablet by mouth Once a day 30 tablet 12    simvastatin (ZOCOR) 20 MG tablet Take 1 tablet (20 mg total) by mouth every evening. 90 tablet 1   sotorasib (LUMAKRAS) 120 MG TABS Take 8 tablets (960 mg) by mouth daily. 240 tablet 3   VITAMIN D PO Take 1 capsule by mouth 3 (three) times a week.     VITAMIN E PO Take 1 capsule by mouth 2 (two) times a week.     No current facility-administered medications for this visit.    SURGICAL HISTORY:  Past Surgical History:  Procedure Laterality Date   DENTAL SURGERY  2012 - 2013   implants   IR IMAGING GUIDED PORT INSERTION  08/27/2020   IR THORACENTESIS ASP PLEURAL SPACE W/IMG GUIDE  12/19/2019   WISDOM TOOTH EXTRACTION  20    REVIEW OF SYSTEMS:  Review of Systems  Constitutional: Positive for fatigue and decreased appetite.  Negative for chills and fever. HENT: Negative for mouth sores, nosebleeds, sore throat and trouble swallowing.   Eyes: Negative for eye problems and icterus.  Respiratory: Positive for shortness of breath and cough.  Negative for hemoptysis and wheezing.   Cardiovascular: Negative for chest pain and leg swelling.  Gastrointestinal: Positive for constipation and occasional nausea.  Negative for abdominal pain, diarrhea, and vomiting.  Genitourinary: Negative for bladder incontinence, difficulty urinating, dysuria, frequency and hematuria.   Musculoskeletal: Positive for low back pain.  Positive for right thigh pain.  Negative for  gait problem, neck pain and neck stiffness.  Skin: Negative for itching and rash.  Neurological: Negative for dizziness (improved), extremity weakness, gait problem, headaches, light-headedness and seizures.  Hematological: Negative for adenopathy. Does not bruise/bleed easily.  Psychiatric/Behavioral: Negative for confusion, depression and sleep disturbance. The patient is not nervous/anxious.     PHYSICAL EXAMINATION:  Blood pressure 131/69, pulse 92, temperature 98.2 F (36.8 C), temperature source Oral, resp. rate 18, weight 170 lb  6.4 oz (77.3 kg), last menstrual period 09/29/2002, SpO2 98 %.  ECOG PERFORMANCE STATUS: 2-3  Physical Exam  Constitutional: Oriented to person, place, and time and chronically ill appearing female, and in no distress.  HENT:  Head: Normocephalic and atraumatic.  Mouth/Throat: Oropharynx is clear and moist. No oropharyngeal exudate.  Eyes: Conjunctivae are normal. Right eye exhibits no discharge. Left eye exhibits no discharge. No scleral icterus.  Neck: Normal range of motion. Neck supple.  Cardiovascular: Normal rate, regular rhythm, normal heart sounds and intact distal pulses.   Pulmonary/Chest: Effort normal. Decreased breath sounds in lower left lung field. No respiratory distress. No wheezes. No rales.  Abdominal: Soft. Bowel sounds are normal. Exhibits no distension and no mass. There is no tenderness.  Musculoskeletal: Normal range of motion. Exhibits no edema. Cast on right foot.  Lymphadenopathy:    No cervical adenopathy.  Neurological: Alert and oriented to person, place, and time. Exhibits muscle wasting. Examined in the wheelchair.  Skin: Skin is warm and dry. No rash noted. Not diaphoretic. No erythema. No pallor.  Psychiatric: Mood, memory and judgment normal.  Vitals reviewed.  LABORATORY DATA: Lab Results  Component Value Date   WBC 7.1 01/04/2021   HGB 9.9 (L) 01/04/2021   HCT 30.0 (L) 01/04/2021   MCV 95.8 01/04/2021   PLT 369 01/04/2021      Chemistry      Component Value Date/Time   NA 133 (L) 01/04/2021 1100   K 4.3 01/04/2021 1100   CL 99 01/04/2021 1100   CO2 26 01/04/2021 1100   BUN 10 01/04/2021 1100   CREATININE 0.66 01/04/2021 1100      Component Value Date/Time   CALCIUM 9.1 01/04/2021 1100   ALKPHOS 240 (H) 01/04/2021 1100   AST 25 01/04/2021 1100   ALT 24 01/04/2021 1100   BILITOT 0.5 01/04/2021 1100       RADIOGRAPHIC STUDIES:  DG Thoracic Spine 2 View  Result Date: 12/22/2020 CLINICAL DATA:  patient fall on upper back.  Complaining of pain. r/o compression fracture. Pain bilateral shoulder pain. EXAM: THORACIC SPINE 2 VIEWS COMPARISON:  CT 11/19/2020 FINDINGS: There is an L1 compression fracture with approximately 15% height loss. There are multiple sclerotic lesions consistent with known metastatic disease. Thoracic spondylosis. Chest port catheter tip overlies the distal superior vena cava. IMPRESSION: L1 compression deformity with approximately 15% height loss. Known sclerotic osseous metastases. Electronically Signed  By: Maurine Simmering M.D.   On: 12/22/2020 15:45   DG Tibia/Fibula Right  Result Date: 12/28/2020 CLINICAL DATA:  Fracture tibia and fibula EXAM: RIGHT TIBIA AND FIBULA - 2 VIEW COMPARISON:  Study done on 12/24/2020 FINDINGS: There is oblique, essentially undisplaced fracture in the distal shaft of fibula. There is fracture in the base of medial malleolus. There is no significant displacement of the fracture fragments. Plantar spur is seen in calcaneus. There are small bony spurs in the dorsal aspect of intertarsal joints. IMPRESSION: Fractures are seen in the distal right tibia and fibula with no significant change in alignment. Electronically Signed   By: Elmer Picker M.D.   On: 12/28/2020 16:49   DG Ankle Complete Right  Result Date: 12/28/2020 CLINICAL DATA:  Status post casting of by malleolar fracture. EXAM: RIGHT ANKLE - COMPLETE 3+ VIEW COMPARISON:  Earlier radiograph dated 12/28/2020. FINDINGS: Minimally displaced oblique fracture of the lateral malleolus and nondisplaced fracture of the medial malleolus. No dislocation. A cast is noted. IMPRESSION: Mildly displaced bimalleolar fracture status post cast placement. Electronically Signed   By: Anner Crete M.D.   On: 12/28/2020 20:37   DG Ankle Complete Right  Result Date: 12/24/2020 CLINICAL DATA:  Right ankle pain after fall today. EXAM: RIGHT ANKLE - COMPLETE 3+ VIEW COMPARISON:  None. FINDINGS: Mildly displaced oblique fracture is  seen involving the distal right fibula. Minimally displaced medial malleolar fracture is noted. IMPRESSION: Distal right tibial and fibular fractures as described above. Electronically Signed   By: Marijo Conception M.D.   On: 12/24/2020 14:31   CT HEAD W & WO CONTRAST (5MM)  Result Date: 01/02/2021 CLINICAL DATA:  Central vertigo with history of lung cancer. EXAM: CT HEAD WITHOUT AND WITH CONTRAST TECHNIQUE: Contiguous axial images were obtained from the base of the skull through the vertex without and with intravenous contrast CONTRAST:  49mL OMNIPAQUE IOHEXOL 350 MG/ML SOLN COMPARISON:  01/22/2020 FINDINGS: Brain: No evidence of acute infarction, hemorrhage, hydrocephalus, extra-axial collection or mass lesion/mass effect. Vascular: No hyperdense vessel or unexpected calcification. Visible vessels are patent. Skull: Normal. Negative for fracture or focal lesion. Sinuses/Orbits: No acute finding. IMPRESSION: Negative brain MRI. No explanation for symptoms or evidence of metastatic disease. Electronically Signed   By: Jorje Guild M.D.   On: 01/02/2021 23:06     ASSESSMENT/PLAN:  This is a very pleasant 75 year old Caucasian female diagnosed with stage IV (T1b, N2, M1 a) non-small cell lung cancer, favoring adenocarcinoma.  She has no actionable mutations based on her molecular studies by [facility].  She had a CT-guided biopsy of one of the left lung pleural-based nodules in which the final pathology was consistent with metastatic adenocarcinoma.  Molecular studies by [facility] showed positive for K-ras G 12 C mutation which is an option for targeted treatment in the second line setting with Lumakras (Sotorasib).   The patient previously underwent palliative systemic chemotherapy with carboplatin for an AUC of 5, Alimta 500 mg per metered squared, Keytruda 200 mg IV every 3 weeks.  She is status post 12 cycles.  Starting from cycle #5, the patient had been on maintenance treatment with Alimta and  Keytruda IV every 3 weeks.  This was discontinued secondary to disease progression.  The patient is currently undergoing targeted treatment with Lumakras 960 mg p.o. daily.  She is status post 2 weeks of treatment.  Her first dose was on 11/29/20. She started developing generalized weakness and diarrhea and her treatment has been  on hold since 12/26/20. She also had mild elevations in her LFTs.   The patient was seen with Dr. Julien Nordmann. Labs were reviewed which show improvement in her LFTs.  Dr. Julien Nordmann gave the patient several options today.  She was given the option of resuming Lumakras at the same dose of 960 mg, resume lumakras at reduced dose (480 mg), referral to palliative care/hospice, vs changing treatment to intravenous alternative chemotherapy which may have more side effects.   The patient is not interested in resuming treatment with Lumakras at this time.  She is strongly considering hospice.  She is familiar with [facility] but is hoping to look at the [facility] [place] in the next few days.  She will call us with her decision and we will be happy to refer her.  In the meantime, for the patient's worsening low back pain, we will arrange for an MRI of the lumbar spine to rule out metastatic disease causing her pain.  If her pain is related to malignancy, we discussed that we may always refer her for palliative radiation.  If her pain is related to an orthopedic concern, then recommend she follow-up with her orthopedic physician or chiropractor.  Dr. Julien Nordmann discussed that her dizzy spells are not related to metastatic disease to the brain.  The patient had a head CT scan performed which was negative for metastatic disease.  Will not arrange for any follow-up appointments at this time but would be happy to see the patient on an as-needed basis if she decides to resume treatment.  For pain management, the patient will alternate between ibuprofen and Tylenol if needed.  Also advised to use  Salonpas patches or heating pads.  She will be given a prescription for oxycodone if needed for breakthrough pain.  The patient was advised to call immediately if she has any concerning symptoms in the interval. The patient voices understanding of current disease status and treatment options and is in agreement with the current care plan. All questions were answered. The patient knows to call the clinic with any problems, questions or concerns. We can certainly see the patient much sooner if necessary        Orders Placed This Encounter  Procedures   MR Lumbar Spine W Wo Contrast    Standing Status:   Future    Standing Expiration Date:   01/04/2022    Order Specific Question:   If indicated for the ordered procedure, I authorize the administration of contrast media per Radiology protocol    Answer:   Yes    Order Specific Question:   What is the patient's sedation requirement?    Answer:   No Sedation    Order Specific Question:   Does the patient have a pacemaker or implanted devices?    Answer:   No    Order Specific Question:   Use SRS Protocol?    Answer:   No    Order Specific Question:   Preferred imaging location?    Answer:   [facility] (table limit - 550 lbs)      Cindy Rathje L Armondo Cech, PA-C 01/04/21  ADDENDUM: Hematology/Oncology Attending: I had a face-to-face encounter with the patient.  I reviewed her record, lab and recommended her care plan.  This is a very pleasant 75 years old white female diagnosed with a stage IV (T1b, N2, M1 a) non-small cell lung cancer favoring adenocarcinoma with positive KRAS G12C mutation.  She is status post initial systemic chemotherapy with  carboplatin, Alimta and Keytruda for 4 cycles followed by maintenance Alimta and Keytruda for 8 more cycles before the patient had evidence for disease progression. She started treatment with Lumakras (Sotorasib) 960 mg p.o. daily for 2 weeks but the patient discontinued the treatment  secondary to increasing fatigue and weakness. She was noted to have elevated liver enzyme last visit the patient was advised to keep Lumakras (Sotorasib) on hold. She is here today for evaluation with repeat blood work that showed complete resolution of her liver dysfunction.  I had a lengthy discussion with the patient and her friend today about her current condition and treatment options. I recommended for the patient to resume her treatment with Lumakras (Sotorasib) at a reduced dose of 480 mg p.o. daily because of the intolerance issues.  The patient is not interested in proceeding with any additional treatment at this point and she is considering palliative care and hospice. She continues to complain of back pain and she has evidence of metastatic disease in that area.  We will arrange for the patient to have MRI of the lumbar spine to rule out any disease progression or nerve compression.  She would be interested in palliative radiotherapy if needed to these areas. The patient will continue with her current pain medication for now. She was advised to call immediately if she has any other concerning symptoms in the interval.  Disclaimer: This note was dictated with voice recognition software. Similar sounding words can inadvertently be transcribed and may be missed upon review. Eilleen Kempf, MD 01/05/21

## 2021-01-01 ENCOUNTER — Ambulatory Visit (HOSPITAL_BASED_OUTPATIENT_CLINIC_OR_DEPARTMENT_OTHER)
Admission: RE | Admit: 2021-01-01 | Discharge: 2021-01-01 | Disposition: A | Discharge status: Home / Self Care | Payer: Medicare PPO | Source: Ambulatory Visit | Attending: Internal Medicine | Admitting: Internal Medicine

## 2021-01-01 ENCOUNTER — Other Ambulatory Visit: Payer: Self-pay

## 2021-01-01 DIAGNOSIS — H814 Vertigo of central origin: Secondary | ICD-10-CM | POA: Insufficient documentation

## 2021-01-01 MED ORDER — IOHEXOL 350 MG/ML SOLN
100.0000 mL | Freq: Once | INTRAVENOUS | Status: AC | PRN
  Administered 2021-01-01: 75 mL via INTRAVENOUS

## 2021-01-01 MED ORDER — HEPARIN SOD (PORK) LOCK FLUSH 100 UNIT/ML IV SOLN: 500.0000 [IU] | Freq: Once | INTRAVENOUS | Status: DC

## 2021-01-01 MED ORDER — SODIUM CHLORIDE 0.9% FLUSH
10.0000 mL | INTRAVENOUS | Status: DC | PRN
  Filled 2021-01-01: qty 10

## 2021-01-04 ENCOUNTER — Other Ambulatory Visit (HOSPITAL_COMMUNITY): Payer: Self-pay

## 2021-01-04 ENCOUNTER — Inpatient Hospital Stay: Payer: Medicare PPO | Admitting: Physician Assistant

## 2021-01-04 ENCOUNTER — Encounter: Payer: Self-pay | Admitting: Physician Assistant

## 2021-01-04 ENCOUNTER — Other Ambulatory Visit: Payer: Self-pay

## 2021-01-04 ENCOUNTER — Inpatient Hospital Stay: Payer: Medicare PPO

## 2021-01-04 VITALS — BP 131/69 | HR 92 | Temp 98.2°F | Resp 18 | Wt 170.4 lb

## 2021-01-04 DIAGNOSIS — M79651 Pain in right thigh: Secondary | ICD-10-CM | POA: Diagnosis not present

## 2021-01-04 DIAGNOSIS — C3492 Malignant neoplasm of unspecified part of left bronchus or lung: Secondary | ICD-10-CM | POA: Diagnosis not present

## 2021-01-04 DIAGNOSIS — F32A Depression, unspecified: Secondary | ICD-10-CM | POA: Diagnosis not present

## 2021-01-04 DIAGNOSIS — G893 Neoplasm related pain (acute) (chronic): Secondary | ICD-10-CM | POA: Diagnosis not present

## 2021-01-04 DIAGNOSIS — Z7189 Other specified counseling: Secondary | ICD-10-CM | POA: Diagnosis not present

## 2021-01-04 DIAGNOSIS — R5383 Other fatigue: Secondary | ICD-10-CM | POA: Diagnosis not present

## 2021-01-04 DIAGNOSIS — Z95828 Presence of other vascular implants and grafts: Secondary | ICD-10-CM

## 2021-01-04 DIAGNOSIS — M545 Low back pain, unspecified: Secondary | ICD-10-CM

## 2021-01-04 DIAGNOSIS — E039 Hypothyroidism, unspecified: Secondary | ICD-10-CM | POA: Diagnosis not present

## 2021-01-04 DIAGNOSIS — M549 Dorsalgia, unspecified: Secondary | ICD-10-CM | POA: Insufficient documentation

## 2021-01-04 DIAGNOSIS — H814 Vertigo of central origin: Secondary | ICD-10-CM

## 2021-01-04 DIAGNOSIS — K5900 Constipation, unspecified: Secondary | ICD-10-CM | POA: Diagnosis not present

## 2021-01-04 LAB — CMP (CANCER CENTER ONLY)
ALT: 24 U/L (ref 0–44)
AST: 25 U/L (ref 15–41)
Albumin: 2.9 g/dL — ABNORMAL LOW (ref 3.5–5.0)
Alkaline Phosphatase: 240 U/L — ABNORMAL HIGH (ref 38–126)
Anion gap: 8 (ref 5–15)
BUN: 10 mg/dL (ref 8–23)
CO2: 26 mmol/L (ref 22–32)
Calcium: 9.1 mg/dL (ref 8.9–10.3)
Chloride: 99 mmol/L (ref 98–111)
Creatinine: 0.66 mg/dL (ref 0.44–1.00)
GFR, Estimated: >60 mL/min (ref >60)
Glucose, Bld: 141 mg/dL — ABNORMAL HIGH (ref 70–99)
Potassium: 4.3 mmol/L (ref 3.5–5.1)
Sodium: 133 mmol/L — ABNORMAL LOW (ref 135–145)
Total Bilirubin: 0.5 mg/dL (ref 0.3–1.2)
Total Protein: 7 g/dL (ref 6.5–8.1)

## 2021-01-04 LAB — CBC WITH DIFFERENTIAL (CANCER CENTER ONLY)
Abs Immature Granulocytes: 0.02 10*3/uL (ref 0.00–0.07)
Basophils Absolute: 0 10*3/uL (ref 0.0–0.1)
Basophils Relative: 0 %
Eosinophils Absolute: 0 10*3/uL (ref 0.0–0.5)
Eosinophils Relative: 0 %
HCT: 30 % — ABNORMAL LOW (ref 36.0–46.0)
Hemoglobin: 9.9 g/dL — ABNORMAL LOW (ref 12.0–15.0)
Immature Granulocytes: 0 %
Lymphocytes Relative: 10 %
Lymphs Abs: 0.7 10*3/uL (ref 0.7–4.0)
MCH: 31.6 pg (ref 26.0–34.0)
MCHC: 33 g/dL (ref 30.0–36.0)
MCV: 95.8 fL (ref 80.0–100.0)
Monocytes Absolute: 0.7 10*3/uL (ref 0.1–1.0)
Monocytes Relative: 10 %
Neutro Abs: 5.6 10*3/uL (ref 1.7–7.7)
Neutrophils Relative %: 80 %
Platelet Count: 369 10*3/uL (ref 150–400)
RBC: 3.13 MIL/uL — ABNORMAL LOW (ref 3.87–5.11)
RDW: 12.4 % (ref 11.5–15.5)
WBC Count: 7.1 10*3/uL (ref 4.0–10.5)
nRBC: 0 % (ref 0.0–0.2)

## 2021-01-04 LAB — TSH: TSH: 7.234 u[IU]/mL — ABNORMAL HIGH (ref 0.308–3.960)

## 2021-01-04 MED ORDER — SODIUM CHLORIDE 0.9% FLUSH
10.0000 mL | Freq: Once | INTRAVENOUS | Status: AC
  Administered 2021-01-04: 10 mL

## 2021-01-04 MED ORDER — HEPARIN SOD (PORK) LOCK FLUSH 100 UNIT/ML IV SOLN
500.0000 [IU] | Freq: Once | INTRAVENOUS | Status: AC
  Administered 2021-01-04: 500 [IU]

## 2021-01-04 MED ORDER — OXYCODONE HCL 5 MG PO TABS
5.0000 mg | ORAL_TABLET | ORAL | 0 refills | Status: DC | PRN
  Filled 2021-01-04: qty 40, 7d supply, fill #0

## 2021-01-05 ENCOUNTER — Other Ambulatory Visit (HOSPITAL_COMMUNITY): Payer: Self-pay

## 2021-01-05 ENCOUNTER — Ambulatory Visit: Payer: Medicare PPO

## 2021-01-05 ENCOUNTER — Ambulatory Visit: Payer: Medicare PPO | Admitting: Internal Medicine

## 2021-01-05 ENCOUNTER — Other Ambulatory Visit: Payer: Medicare PPO

## 2021-01-05 ENCOUNTER — Encounter: Payer: Self-pay | Admitting: Physician Assistant

## 2021-01-07 ENCOUNTER — Other Ambulatory Visit (HOSPITAL_BASED_OUTPATIENT_CLINIC_OR_DEPARTMENT_OTHER): Payer: Self-pay | Admitting: Orthopaedic Surgery

## 2021-01-07 DIAGNOSIS — S82841G Displaced bimalleolar fracture of right lower leg, subsequent encounter for closed fracture with delayed healing: Secondary | ICD-10-CM

## 2021-01-08 ENCOUNTER — Other Ambulatory Visit (HOSPITAL_BASED_OUTPATIENT_CLINIC_OR_DEPARTMENT_OTHER): Payer: Self-pay

## 2021-01-08 ENCOUNTER — Ambulatory Visit (HOSPITAL_BASED_OUTPATIENT_CLINIC_OR_DEPARTMENT_OTHER): Payer: Medicare PPO | Admitting: Orthopaedic Surgery

## 2021-01-08 ENCOUNTER — Other Ambulatory Visit: Payer: Self-pay

## 2021-01-08 ENCOUNTER — Ambulatory Visit (HOSPITAL_BASED_OUTPATIENT_CLINIC_OR_DEPARTMENT_OTHER)
Admission: RE | Admit: 2021-01-08 | Discharge: 2021-01-08 | Disposition: A | Discharge status: Home / Self Care | Payer: Medicare PPO | Source: Ambulatory Visit | Attending: Orthopaedic Surgery | Admitting: Orthopaedic Surgery

## 2021-01-08 DIAGNOSIS — S82841G Displaced bimalleolar fracture of right lower leg, subsequent encounter for closed fracture with delayed healing: Secondary | ICD-10-CM | POA: Insufficient documentation

## 2021-01-08 DIAGNOSIS — S82301A Unspecified fracture of lower end of right tibia, initial encounter for closed fracture: Secondary | ICD-10-CM | POA: Diagnosis not present

## 2021-01-08 MED ORDER — MELOXICAM 15 MG PO TABS
15.0000 mg | ORAL_TABLET | Freq: Every day | ORAL | 2 refills | Status: DC
  Filled 2021-01-08: qty 30, 30d supply, fill #0
  Filled 2021-02-04: qty 30, 30d supply, fill #1
  Filled 2021-02-04: qty 30, 30d supply, fill #0

## 2021-01-08 NOTE — Progress Notes (Signed)
Chief Complaint: right ankle fracture     History of Present Illness:   01/08/2021: Presents today for follow-up of the right ankle.  She has been compliant with nonweightbearing status on the right leg.  She did have a slip and fall.  She endorses some IT band lateral thigh pain about the left hip as she has been favoring the right side and putting more stress on the left side   Cindy [name] is a 75 y.o. female with right ankle pain after a fall while cleaning up her dogs past.  This occurred on 12/24/2020.  Of note she did put weight on the ankle following that and subsequently presented to the emergency room.  She currently lives at home alone with help of friends who come to her house.  Denies any history of diabetes on insulin.  She does state that she was previously told she is prediabetic.  She is currently battling lung cancer.  She is on chemotherapy for this.    Surgical History:   None  PMH/PSH/Family History/Social History/Meds/Allergies:    Past Medical History:  Diagnosis Date   Anxiety    Depression    Hyperlipidemia    Hypertension    Past Surgical History:  Procedure Laterality Date   DENTAL SURGERY  2012 - 2013   implants   IR IMAGING GUIDED PORT INSERTION  08/27/2020   IR THORACENTESIS ASP PLEURAL SPACE W/IMG GUIDE  12/19/2019   WISDOM TOOTH EXTRACTION  20   Social History   Socioeconomic History   Marital status: Widowed    Spouse name: Not on file   Number of children: 0   Years of education: Not on file   Highest education level: Not on file  Occupational History    Employer: [employer]  Tobacco Use   Smoking status: Former    Packs/day: 2.00    Years: 15.00    Pack years: 30.00    Types: Cigarettes    Quit date: 1985    Years since quitting: 37.8   Smokeless tobacco: Never  Vaping Use   Vaping Use: Never used  Substance and Sexual Activity   Alcohol use: Yes    Alcohol/week: 10.0 standard  drinks    Types: 10 Standard drinks or equivalent per week   Drug use: No   Sexual activity: Never    Birth control/protection: Abstinence, Post-menopausal  Other Topics Concern   Not on file  Social History Narrative   Not on file   Social Determinants of Health   Financial Resource Strain: Not on file  Food Insecurity: Not on file  Transportation Needs: Not on file  Physical Activity: Not on file  Stress: Not on file  Social Connections: Not on file   Family History  Problem Relation Age of Onset   Dementia Mother    COPD Mother    Lung cancer Maternal Grandmother    Cirrhosis Brother    Allergies  Allergen Reactions   Escitalopram      sweating, headaches Other reaction(s): sweating, headaches Other reaction(s): sweating, headaches   Povidone Iodine Itching    itching   Procaine Palpitations   Current Outpatient Medications  Medication Sig Dispense Refill   ALPRAZolam (XANAX) 0.25 MG tablet Take 1 tablet by mouth once a day as needed 30 tablet 0  Ascorbic Acid (VITAMIN C PO) Take 1 tablet by mouth daily.     aspirin 81 MG tablet Take 81 mg by mouth daily.     b complex vitamins capsule Take 1 capsule by mouth 2 (two) times a week.     HYDROcodone bit-homatropine (HYCODAN) 5-1.5 MG/5ML syrup Take 5 mLs by mouth every 6 hours as needed for cough. 120 mL 0   levothyroxine (SYNTHROID) 75 MCG tablet Take 1 tablet by mouth on an empty stomach once daily in the morning 30 tablet 12   lidocaine-prilocaine (EMLA) cream Apply to the Port-A-Cath site 30-60-minute before chemotherapy. 30 g 0   metoprolol succinate (TOPROL-XL) 50 MG 24 hr tablet Take 1 tablet (50 mg total) by mouth daily. 90 tablet 3   Multiple Minerals-Vitamins (CALCIUM-MAGNESIUM-ZINC-D3 PO) Take 1 tablet by mouth 2 (two) times a week.     Multiple Vitamin (MULTIVITAMIN) tablet Take 1 tablet by mouth 3 (three) times a week.     neomycin-polymyxin b-dexamethasone (MAXITROL) 3.5-10000-0.1 SUSP Place 1 drop into  the both eyes 4 times a day 5 mL 0   oxyCODONE (ROXICODONE) 5 MG immediate release tablet Take 1 tablet by mouth every 4 hours as needed for severe pain. 40 tablet 0   polyethylene glycol (MIRALAX / GLYCOLAX) 17 g packet Take 17 g by mouth daily.     Polyvinyl Alcohol-Povidone (REFRESH OP) Place 1 drop into both eyes daily as needed (dry eyes).     prochlorperazine (COMPAZINE) 10 MG tablet TAKE 1 TABLET(10 MG) BY MOUTH EVERY 6 HOURS AS NEEDED FOR NAUSEA OR VOMITING 30 tablet 0   sertraline (ZOLOFT) 50 MG tablet Take 1 tablet by mouth Once a day 30 tablet 12   simvastatin (ZOCOR) 20 MG tablet Take 1 tablet (20 mg total) by mouth every evening. 90 tablet 1   sotorasib (LUMAKRAS) 120 MG TABS Take 8 tablets (960 mg) by mouth daily. 240 tablet 3   VITAMIN D PO Take 1 capsule by mouth 3 (three) times a week.     VITAMIN E PO Take 1 capsule by mouth 2 (two) times a week.     No current facility-administered medications for this visit.   No results found.  Review of Systems:   A ROS was performed including pertinent positives and negatives as documented in the HPI.  Physical Exam :   Constitutional: NAD and appears stated age Neurological: Alert and oriented Psych: Appropriate affect and cooperative Last menstrual period 09/29/2002.   Comprehensive Musculoskeletal Exam:    Cast has a small area of breakdown on the bottom, ankle portion is well  Imaging:   Xray (3 views right ankle, 2 views tib-fib): Bimalleolar ankle fracture with minimal displacement   I personally reviewed and interpreted the radiographs.   Assessment:   75 year old female with right bimalleolar ankle fracture after a fall.  Fracture still shows minimal displacement in the cast.  We will plan for an additional 4 weeks cast.   Plan :    -Return to clinic in 4 weeks    I personally saw and evaluated the patient, and participated in the management and treatment plan.  Vanetta Mulders, MD Attending Physician,  Orthopedic Surgery  This document was dictated using Dragon voice recognition software. A reasonable attempt at proof reading has been made to minimize errors.

## 2021-01-11 ENCOUNTER — Other Ambulatory Visit: Payer: Self-pay

## 2021-01-11 ENCOUNTER — Encounter (HOSPITAL_BASED_OUTPATIENT_CLINIC_OR_DEPARTMENT_OTHER): Payer: Self-pay | Admitting: Obstetrics and Gynecology

## 2021-01-11 ENCOUNTER — Telehealth: Payer: Self-pay

## 2021-01-11 ENCOUNTER — Emergency Department (HOSPITAL_BASED_OUTPATIENT_CLINIC_OR_DEPARTMENT_OTHER): Payer: Medicare PPO

## 2021-01-11 ENCOUNTER — Emergency Department (HOSPITAL_BASED_OUTPATIENT_CLINIC_OR_DEPARTMENT_OTHER)
Admission: EM | Admit: 2021-01-11 | Discharge: 2021-01-11 | Disposition: A | Discharge status: Home / Self Care | Payer: Medicare PPO | Attending: Emergency Medicine | Admitting: Emergency Medicine

## 2021-01-11 DIAGNOSIS — R110 Nausea: Secondary | ICD-10-CM | POA: Diagnosis not present

## 2021-01-11 DIAGNOSIS — Z87891 Personal history of nicotine dependence: Secondary | ICD-10-CM | POA: Insufficient documentation

## 2021-01-11 DIAGNOSIS — I10 Essential (primary) hypertension: Secondary | ICD-10-CM | POA: Insufficient documentation

## 2021-01-11 DIAGNOSIS — Z79899 Other long term (current) drug therapy: Secondary | ICD-10-CM | POA: Diagnosis not present

## 2021-01-11 DIAGNOSIS — R1084 Generalized abdominal pain: Secondary | ICD-10-CM | POA: Insufficient documentation

## 2021-01-11 DIAGNOSIS — Z7982 Long term (current) use of aspirin: Secondary | ICD-10-CM | POA: Diagnosis not present

## 2021-01-11 DIAGNOSIS — K5900 Constipation, unspecified: Secondary | ICD-10-CM | POA: Insufficient documentation

## 2021-01-11 DIAGNOSIS — R296 Repeated falls: Secondary | ICD-10-CM | POA: Diagnosis not present

## 2021-01-11 LAB — COMPREHENSIVE METABOLIC PANEL
ALT: 9 U/L (ref 0–44)
AST: 18 U/L (ref 15–41)
Albumin: 3.4 g/dL — ABNORMAL LOW (ref 3.5–5.0)
Alkaline Phosphatase: 148 U/L — ABNORMAL HIGH (ref 38–126)
Anion gap: 9 (ref 5–15)
BUN: 7 mg/dL — ABNORMAL LOW (ref 8–23)
CO2: 28 mmol/L (ref 22–32)
Calcium: 9.4 mg/dL (ref 8.9–10.3)
Chloride: 98 mmol/L (ref 98–111)
Creatinine, Ser: 0.49 mg/dL (ref 0.44–1.00)
GFR, Estimated: >60 mL/min (ref >60)
Glucose, Bld: 101 mg/dL — ABNORMAL HIGH (ref 70–99)
Potassium: 3.8 mmol/L (ref 3.5–5.1)
Sodium: 135 mmol/L (ref 135–145)
Total Bilirubin: 0.6 mg/dL (ref 0.3–1.2)
Total Protein: 6.5 g/dL (ref 6.5–8.1)

## 2021-01-11 LAB — CBC WITH DIFFERENTIAL/PLATELET
Abs Immature Granulocytes: 0.02 10*3/uL (ref 0.00–0.07)
Basophils Absolute: 0 10*3/uL (ref 0.0–0.1)
Basophils Relative: 1 %
Eosinophils Absolute: 0 10*3/uL (ref 0.0–0.5)
Eosinophils Relative: 0 %
HCT: 28.5 % — ABNORMAL LOW (ref 36.0–46.0)
Hemoglobin: 9.3 g/dL — ABNORMAL LOW (ref 12.0–15.0)
Immature Granulocytes: 0 %
Lymphocytes Relative: 12 %
Lymphs Abs: 0.7 10*3/uL (ref 0.7–4.0)
MCH: 31 pg (ref 26.0–34.0)
MCHC: 32.6 g/dL (ref 30.0–36.0)
MCV: 95 fL (ref 80.0–100.0)
Monocytes Absolute: 0.6 10*3/uL (ref 0.1–1.0)
Monocytes Relative: 11 %
Neutro Abs: 4.3 10*3/uL (ref 1.7–7.7)
Neutrophils Relative %: 76 %
Platelets: 337 10*3/uL (ref 150–400)
RBC: 3 MIL/uL — ABNORMAL LOW (ref 3.87–5.11)
RDW: 12.2 % (ref 11.5–15.5)
WBC: 5.7 10*3/uL (ref 4.0–10.5)
nRBC: 0 % (ref 0.0–0.2)

## 2021-01-11 LAB — LIPASE, BLOOD: Lipase: 67 U/L — ABNORMAL HIGH (ref 11–51)

## 2021-01-11 LAB — MAGNESIUM: Magnesium: 2 mg/dL (ref 1.7–2.4)

## 2021-01-11 MED ORDER — IOHEXOL 300 MG/ML  SOLN
100.0000 mL | Freq: Once | INTRAMUSCULAR | Status: AC | PRN
  Administered 2021-01-11: 100 mL via INTRAVENOUS

## 2021-01-11 MED ORDER — ONDANSETRON HCL 4 MG/2ML IJ SOLN: 4.0000 mg | Freq: Once | INTRAMUSCULAR | Status: DC

## 2021-01-11 MED ORDER — HEPARIN SOD (PORK) LOCK FLUSH 100 UNIT/ML IV SOLN
500.0000 [IU] | Freq: Once | INTRAVENOUS | Status: AC
  Administered 2021-01-11: 500 [IU]
  Filled 2021-01-11: qty 5

## 2021-01-11 MED ORDER — SODIUM CHLORIDE 0.9 % IV BOLUS
1000.0000 mL | Freq: Once | INTRAVENOUS | Status: AC
  Administered 2021-01-11: 1000 mL via INTRAVENOUS

## 2021-01-11 MED ORDER — MORPHINE SULFATE (PF) 4 MG/ML IV SOLN: 4.0000 mg | Freq: Once | INTRAVENOUS | Status: DC

## 2021-01-11 NOTE — ED Notes (Signed)
Pt took her own supply (1 tablet) of Oxycodone IR 5 mg.

## 2021-01-11 NOTE — ED Provider Notes (Signed)
3:54 PM Care assumed from Dr. Tyrone Nine.  At time of transfer care, patient is awaiting results of CT scan followed by reassessment.  Patient was having constipation and CT was obtained to help rule out obstruction.  If CT does not show obstruction, plan of care be discharged home to follow-up with her gastroenterologist and continue outpatient therapy for constipation.  5:27 PM CT scan did not show obstruction.  We discussed all the findings.  Patient is going to call her PCP and a gastroenterologist for further recommendations.  She reports she has all of the constipation medicines at home.  Patient will maintain hydration and will be discharged.  She agrees with plan of care and will continue outpatient work-up.    Clinical Impression: 1. Generalized abdominal pain     Disposition: Discharge  Condition: Good  I have discussed the results, Dx and Tx plan with the pt(& family if present). He/she/they expressed understanding and agree(s) with the plan. Discharge instructions discussed at great length. Strict return precautions discussed and pt &/or family have verbalized understanding of the instructions. No further questions at time of discharge.    New Prescriptions   No medications on file    Follow Up: [name], MD [address] [place] [address] [phone]  Call in 1 day      Jermone Geister, Gwenyth Allegra, MD 01/11/21 1729

## 2021-01-11 NOTE — ED Provider Notes (Signed)
[facility] EMERGENCY DEPT Provider Note   CSN: [id number] Arrival date & time: 01/11/21  1102     History Chief Complaint  Patient presents with   Constipation   Generalized Body Aches    Cindy [name] is a 75 y.o. female.  75 yo F with a chief complaints of constipation.  The patient tells me that she has had difficulty having a good bowel movement for about 10 days now.  Has had some nausea but no vomiting.  She thought it was a side effect from her chemotherapy though has not had chemotherapy in a couple weeks.  Her friend is a Marine scientist and so they tried multiple therapies at home including multiple enemas a suppository a MiraLAX cleanout protocol without significant improvement.  She has some abdominal discomfort mostly in the lower aspect of the abdomen bilaterally.  She denies history of prior abdominal surgery.  The history is provided by the patient.  Constipation Associated symptoms: no dysuria, no fever, no nausea and no vomiting   Illness Severity:  Moderate Onset quality:  Gradual Duration:  10 days Timing:  Constant Progression:  Worsening Chronicity:  New Associated symptoms: no chest pain, no congestion, no fever, no headaches, no myalgias, no nausea, no rhinorrhea, no shortness of breath, no vomiting and no wheezing       Past Medical History:  Diagnosis Date   Anxiety    Depression    Hyperlipidemia    Hypertension     Patient Active Problem List   Diagnosis Date Noted   Back pain 01/04/2021   Port-A-Cath in place 11/24/2020   Encounter for antineoplastic immunotherapy 03/02/2020   Adenocarcinoma of left lung, stage 4 ([place]) 01/17/2020   Goals of care, counseling/discussion 01/17/2020   Encounter for antineoplastic chemotherapy 01/17/2020   HTN (hypertension) 04/27/2012   Other and unspecified hyperlipidemia 04/27/2012    Past Surgical History:  Procedure Laterality Date   DENTAL SURGERY  2012 - 2013   implants   IR IMAGING  GUIDED PORT INSERTION  08/27/2020   IR THORACENTESIS ASP PLEURAL SPACE W/IMG GUIDE  12/19/2019   WISDOM TOOTH EXTRACTION  20     OB History     Gravida  0   Para  0   Term      Preterm      AB      Living         SAB      IAB      Ectopic      Multiple      Live Births              Family History  Problem Relation Age of Onset   Dementia Mother    COPD Mother    Lung cancer Maternal Grandmother    Cirrhosis Brother     Social History   Tobacco Use   Smoking status: Former    Packs/day: 2.00    Years: 15.00    Pack years: 30.00    Types: Cigarettes    Quit date: 1985    Years since quitting: 37.8   Smokeless tobacco: Never  Vaping Use   Vaping Use: Never used  Substance Use Topics   Alcohol use: Yes    Alcohol/week: 10.0 standard drinks    Types: 10 Standard drinks or equivalent per week   Drug use: No    Home Medications Prior to Admission medications   Medication Sig Start Date End Date Taking? Authorizing Provider  ALPRAZolam Duanne Moron) 0.25  MG tablet Take 1 tablet by mouth once a day as needed 11/12/20     Ascorbic Acid (VITAMIN C PO) Take 1 tablet by mouth daily.    [provider]  aspirin 81 MG tablet Take 81 mg by mouth daily.    [provider]  b complex vitamins capsule Take 1 capsule by mouth 2 (two) times a week.    [provider]  HYDROcodone bit-homatropine (HYCODAN) 5-1.5 MG/5ML syrup Take 5 mLs by mouth every 6 hours as needed for cough. 07/24/20   Heilingoetter, Cassandra L, PA-C  levothyroxine (SYNTHROID) 75 MCG tablet Take 1 tablet by mouth on an empty stomach once daily in the morning 09/09/20     lidocaine-prilocaine (EMLA) cream Apply to the Port-A-Cath site 30-60-minute before chemotherapy. 07/14/20   Curt Bears, MD  meloxicam (MOBIC) 15 MG tablet Take 1 tablet (15 mg total) by mouth daily. 01/08/21   Vanetta Mulders, MD  metoprolol succinate (TOPROL-XL) 50 MG 24 hr tablet Take 1 tablet (50 mg  total) by mouth daily. 08/12/20     Multiple Minerals-Vitamins (CALCIUM-MAGNESIUM-ZINC-D3 PO) Take 1 tablet by mouth 2 (two) times a week.    [provider]  Multiple Vitamin (MULTIVITAMIN) tablet Take 1 tablet by mouth 3 (three) times a week.    [provider]  neomycin-polymyxin b-dexamethasone (MAXITROL) 3.5-10000-0.1 SUSP Place 1 drop into the both eyes 4 times a day 11/12/20     oxyCODONE (ROXICODONE) 5 MG immediate release tablet Take 1 tablet (5 mg total) by mouth every 6 (six) hours as needed for severe pain. 01/12/21   Heilingoetter, Cassandra L, PA-C  polyethylene glycol (MIRALAX / GLYCOLAX) 17 g packet Take 17 g by mouth daily.    [provider]  Polyvinyl Alcohol-Povidone (REFRESH OP) Place 1 drop into both eyes daily as needed (dry eyes).    [provider]  prochlorperazine (COMPAZINE) 10 MG tablet TAKE 1 TABLET(10 MG) BY MOUTH EVERY 6 HOURS AS NEEDED FOR NAUSEA OR VOMITING 03/23/20   Curt Bears, MD  sertraline (ZOLOFT) 50 MG tablet Take 1 tablet by mouth Once a day 09/09/20     simvastatin (ZOCOR) 20 MG tablet Take 1 tablet (20 mg total) by mouth every evening. 03/11/20     sotorasib (LUMAKRAS) 120 MG TABS Take 8 tablets (960 mg) by mouth daily. 11/24/20   Curt Bears, MD  VITAMIN D PO Take 1 capsule by mouth 3 (three) times a week.    [provider]  VITAMIN E PO Take 1 capsule by mouth 2 (two) times a week.    [provider]  mirtazapine (REMERON) 30 MG tablet Take 1 tablet (30 mg total) by mouth at bedtime. 03/31/20 04/21/20  Curt Bears, MD    Allergies    Escitalopram, Povidone iodine, and Procaine  Review of Systems   Review of Systems  Constitutional:  Negative for chills and fever.  HENT:  Negative for congestion and rhinorrhea.   Eyes:  Negative for redness and visual disturbance.  Respiratory:  Negative for shortness of breath and wheezing.   Cardiovascular:  Negative for chest pain and palpitations.   Gastrointestinal:  Positive for constipation. Negative for nausea and vomiting.  Genitourinary:  Negative for dysuria and urgency.  Musculoskeletal:  Negative for arthralgias and myalgias.  Skin:  Negative for pallor and wound.  Neurological:  Negative for dizziness and headaches.   Physical Exam Updated Vital Signs BP (!) 146/65   Pulse 85   Temp 98.2 F (36.8  C)   Resp 16   LMP 09/29/2002   SpO2 96%   Physical Exam Vitals and nursing note reviewed.  Constitutional:      General: She is not in acute distress.    Appearance: She is well-developed. She is not diaphoretic.  HENT:     Head: Normocephalic and atraumatic.  Eyes:     Pupils: Pupils are equal, round, and reactive to light.  Cardiovascular:     Rate and Rhythm: Normal rate and regular rhythm.     Heart sounds: No murmur heard.   No friction rub. No gallop.  Pulmonary:     Effort: Pulmonary effort is normal.     Breath sounds: No wheezing or rales.  Abdominal:     General: There is no distension.     Palpations: Abdomen is soft.     Tenderness: There is no abdominal tenderness.  Musculoskeletal:        General: No tenderness.     Cervical back: Normal range of motion and neck supple.  Skin:    General: Skin is warm and dry.  Neurological:     Mental Status: She is alert and oriented to person, place, and time.  Psychiatric:        Behavior: Behavior normal.    ED Results / Procedures / Treatments   Labs (all labs ordered are listed, but only abnormal results are displayed) Labs Reviewed  CBC WITH DIFFERENTIAL/PLATELET - Abnormal; Notable for the following components:      Result Value   RBC 3.00 (*)    Hemoglobin 9.3 (*)    HCT 28.5 (*)    All other components within normal limits  COMPREHENSIVE METABOLIC PANEL - Abnormal; Notable for the following components:   Glucose, Bld 101 (*)    BUN 7 (*)    Albumin 3.4 (*)    Alkaline Phosphatase 148 (*)    All other components within normal limits   LIPASE, BLOOD - Abnormal; Notable for the following components:   Lipase 67 (*)    All other components within normal limits  MAGNESIUM    EKG None  Radiology CT ABDOMEN PELVIS W CONTRAST  Result Date: 01/11/2021 CLINICAL DATA:  Bowel obstruction suspected. Hx of Lung CA. Generalized body pain. Multiple falls recently. Constipation. No bowel movement for 3 days. EXAM: CT ABDOMEN AND PELVIS WITH CONTRAST TECHNIQUE: Multidetector CT imaging of the abdomen and pelvis was performed using the standard protocol following bolus administration of intravenous contrast. CONTRAST:  120mL OMNIPAQUE IOHEXOL 300 MG/ML  SOLN COMPARISON:  CT chest, abdomen, pelvis 11/19/2020 report without imaging, CT chest abdomen pelvis 09/18/2020 FINDINGS: Partially visualized central venous catheter with tip terminating in the right atrium. Lower chest: Persistent trace left pleural effusion. Interval development of a trace right pleural effusion. Peribronchovascular airspace opacities within the left lower lobe. Coronary calcifications. Hepatobiliary: No focal liver abnormality. No gallstones, gallbladder wall thickening, or pericholecystic fluid. No biliary dilatation. Pancreas: No focal lesion. Normal pancreatic contour. No surrounding inflammatory changes. No main pancreatic ductal dilatation. Spleen: Normal in size without focal abnormality. Adrenals/Urinary Tract: No adrenal nodule bilaterally. Bilateral kidneys enhance symmetrically. No hydronephrosis. No hydroureter. The urinary bladder is unremarkable. Stomach/Bowel: Stomach is within normal limits. No evidence of bowel wall thickening or dilatation. Scattered colonic diverticulosis. Stool throughout the ascending and transverse colon. Appendix appears normal. Vascular/Lymphatic: No abdominal aorta or iliac aneurysm. Severe atherosclerotic plaque of the aorta and its branches. No abdominal, pelvic, or inguinal lymphadenopathy. Reproductive: Uterus and bilateral adnexa  are unremarkable. Other: No intraperitoneal free fluid. No intraperitoneal free gas. No organized fluid collection. Musculoskeletal: No abdominal wall hernia or abnormality. Interval worsening of sclerotic lesions of the appendicular and axial skeleton (example: 2:89, 87, 67, 848, 41, 29). No acute displaced fracture. Multilevel degenerative changes of the spine. IMPRESSION: 1. Stool throughout the ascending and transverse colon. Otherwise no acute intra-abdominal or intrapelvic abnormality with no findings of bowel obstruction. 2. Interval development of a trace right pleural effusion. 3. Peribronchovascular airspace opacities within the left lower lobe which may represent a combination of edema versus infection/inflammation. 4. Persistent trace left pleural effusion. 5. Interval worsening of axial and appendicular skeleton sclerotic osseous metastases. 6.  Aortic Atherosclerosis (ICD10-I70.0). Electronically Signed   By: Iven Finn M.D.   On: 01/11/2021 15:45    Procedures Procedures   Medications Ordered in ED Medications  sodium chloride 0.9 % bolus 1,000 mL (0 mLs Intravenous Stopped 01/11/21 1748)  iohexol (OMNIPAQUE) 300 MG/ML solution 100 mL (100 mLs Intravenous Contrast Given 01/11/21 1519)  heparin lock flush 100 unit/mL (500 Units Intracatheter Given 01/11/21 1740)    ED Course  I have reviewed the triage vital signs and the nursing notes.  Pertinent labs & imaging results that were available during my care of the patient were reviewed by me and considered in my medical decision making (see chart for details).    MDM Rules/Calculators/A&P                           75 yo F with a chief complaints of constipation.  This been going on for about 10 days now.  Patient with no stool in the vault.  With her having abdominal discomfort and history of cancer we will obtain a CT scan with contrast.  Lab work fluids reassess.  Signed out to Dr. Sherry Ruffing, please see their note for further  details of care in the ED.  The patients results and plan were reviewed and discussed.   Any x-rays performed were independently reviewed by myself.   Differential diagnosis were considered with the presenting HPI.  Medications  sodium chloride 0.9 % bolus 1,000 mL (0 mLs Intravenous Stopped 01/11/21 1748)  iohexol (OMNIPAQUE) 300 MG/ML solution 100 mL (100 mLs Intravenous Contrast Given 01/11/21 1519)  heparin lock flush 100 unit/mL (500 Units Intracatheter Given 01/11/21 1740)    Vitals:   01/11/21 1413 01/11/21 1445 01/11/21 1609 01/11/21 1745  BP: (!) 145/70 137/74 (!) 161/75 (!) 146/65  Pulse: 66 68 79 85  Resp: 13 14 20 16   Temp:      SpO2: 100% 98% 99% 96%    Final diagnoses:  Generalized abdominal pain  Constipation, unspecified constipation type      Final Clinical Impression(s) / ED Diagnoses Final diagnoses:  Generalized abdominal pain  Constipation, unspecified constipation type    Rx / DC Orders ED Discharge Orders     None        Deno Etienne, DO 01/12/21 1512

## 2021-01-11 NOTE — ED Triage Notes (Signed)
Patient presents to the ER for constipation, no BM x10 days. Patient reports she is having generalized body pain and leg pain from her multiple recent falls. Patient reports she has been taking a 5mg  percocets q4h.

## 2021-01-11 NOTE — Telephone Encounter (Signed)
I spoke with the pt. She states she hasn't been eating much and has tried 4 enema's, Miralax and stool softeners and nothing is working. She further stated she isn't sure if she is having abdominal pain or back pain and wants to know if she should go to UC or ER. Pt was didn't want to go to UC because she states she has not had a good experience there, therefore pt was advised to go to the ER for evaluation. Pt expressed understanding of this information.

## 2021-01-11 NOTE — ED Notes (Signed)
At bedside w/ Dr. Tyrone Nine for patient exam and chaperoning.

## 2021-01-11 NOTE — Telephone Encounter (Signed)
Pt called stating she has not had a BM is over a week and is having low abdominal pain.

## 2021-01-11 NOTE — Discharge Instructions (Addendum)
Take 8 scoops of miralax in 32oz of whatever you would like to drink.(Gatorade comes in this size) You can also use a fleets enema which you can buy over the counter at the pharmacy.  Return for worsening abdominal pain, vomiting or fever.  The CT scan did not show obstruction.  Please call a gastroenterologist and your PCP to discuss further management.  Please rest and stay hydrated and continue outpatient work-up.

## 2021-01-12 ENCOUNTER — Other Ambulatory Visit: Payer: Self-pay | Admitting: Physician Assistant

## 2021-01-12 ENCOUNTER — Telehealth: Payer: Self-pay | Admitting: Medical Oncology

## 2021-01-12 ENCOUNTER — Other Ambulatory Visit (HOSPITAL_COMMUNITY): Payer: Self-pay

## 2021-01-12 DIAGNOSIS — C3492 Malignant neoplasm of unspecified part of left bronchus or lung: Secondary | ICD-10-CM

## 2021-01-12 DIAGNOSIS — G893 Neoplasm related pain (acute) (chronic): Secondary | ICD-10-CM

## 2021-01-12 MED ORDER — OXYCODONE HCL 5 MG PO TABS
5.0000 mg | ORAL_TABLET | Freq: Four times a day (QID) | ORAL | 0 refills | Status: DC | PRN
  Filled 2021-01-12: qty 40, 10d supply, fill #0

## 2021-01-12 NOTE — Telephone Encounter (Signed)
Refill Roxicodone requested.

## 2021-01-13 ENCOUNTER — Telehealth: Payer: Self-pay

## 2021-01-13 DIAGNOSIS — C3492 Malignant neoplasm of unspecified part of left bronchus or lung: Secondary | ICD-10-CM

## 2021-01-13 NOTE — Telephone Encounter (Signed)
Referral made to [facility]. [place] advised they will see her tomorrow morning.

## 2021-01-14 ENCOUNTER — Ambulatory Visit: Payer: Medicare PPO | Admitting: Pulmonary Disease

## 2021-01-14 ENCOUNTER — Ambulatory Visit (HOSPITAL_BASED_OUTPATIENT_CLINIC_OR_DEPARTMENT_OTHER): Payer: Medicare PPO | Admitting: Orthopaedic Surgery

## 2021-01-15 ENCOUNTER — Ambulatory Visit (HOSPITAL_COMMUNITY): Payer: Medicare PPO

## 2021-01-15 ENCOUNTER — Encounter (HOSPITAL_COMMUNITY): Payer: Self-pay

## 2021-01-18 ENCOUNTER — Other Ambulatory Visit (HOSPITAL_COMMUNITY): Payer: Self-pay

## 2021-01-18 MED ORDER — SENNOSIDES-DOCUSATE SODIUM 8.6-50 MG PO TABS
ORAL_TABLET | ORAL | 2 refills | Status: DC
  Filled 2021-01-20: qty 100, 15d supply, fill #0

## 2021-01-18 MED ORDER — SERTRALINE HCL 50 MG PO TABS
50.0000 mg | ORAL_TABLET | Freq: Every day | ORAL | 0 refills | Status: DC
  Filled 2021-01-18: qty 30, 30d supply, fill #0
  Filled 2021-01-20: qty 15, 15d supply, fill #0

## 2021-01-18 MED ORDER — OXYCODONE HCL 10 MG PO TABS
ORAL_TABLET | ORAL | 0 refills | Status: DC
  Filled 2021-01-18: qty 90, 15d supply, fill #0

## 2021-01-20 ENCOUNTER — Other Ambulatory Visit (HOSPITAL_COMMUNITY): Payer: Self-pay

## 2021-01-20 ENCOUNTER — Encounter: Payer: Self-pay | Admitting: Physician Assistant

## 2021-01-20 ENCOUNTER — Telehealth: Payer: Self-pay | Admitting: Orthopaedic Surgery

## 2021-01-20 MED ORDER — SENNOSIDES-DOCUSATE SODIUM 8.6-50 MG PO TABS: ORAL_TABLET | ORAL | 3 refills | Status: DC

## 2021-01-20 NOTE — Telephone Encounter (Signed)
Pt mom called and states pt cast is coming apart. She states pt has been showering and putting a bag on it but its not working. She wants a boot or something.   CB [phone]

## 2021-01-20 NOTE — Telephone Encounter (Signed)
Spoke to patient directly and added her on to 11/29 Tuesday schedule

## 2021-01-25 ENCOUNTER — Other Ambulatory Visit (HOSPITAL_BASED_OUTPATIENT_CLINIC_OR_DEPARTMENT_OTHER): Payer: Self-pay | Admitting: Orthopaedic Surgery

## 2021-01-25 ENCOUNTER — Other Ambulatory Visit (HOSPITAL_COMMUNITY): Payer: Self-pay

## 2021-01-25 DIAGNOSIS — S82841A Displaced bimalleolar fracture of right lower leg, initial encounter for closed fracture: Secondary | ICD-10-CM

## 2021-01-26 ENCOUNTER — Other Ambulatory Visit: Payer: Self-pay

## 2021-01-26 ENCOUNTER — Ambulatory Visit (INDEPENDENT_AMBULATORY_CARE_PROVIDER_SITE_OTHER): Payer: Medicare PPO | Admitting: Orthopaedic Surgery

## 2021-01-26 ENCOUNTER — Other Ambulatory Visit (HOSPITAL_BASED_OUTPATIENT_CLINIC_OR_DEPARTMENT_OTHER): Payer: Self-pay

## 2021-01-26 ENCOUNTER — Ambulatory Visit (HOSPITAL_BASED_OUTPATIENT_CLINIC_OR_DEPARTMENT_OTHER)
Admission: RE | Admit: 2021-01-26 | Discharge: 2021-01-26 | Disposition: A | Discharge status: Home / Self Care | Payer: Medicare PPO | Source: Ambulatory Visit | Attending: Orthopaedic Surgery | Admitting: Orthopaedic Surgery

## 2021-01-26 DIAGNOSIS — S82841A Displaced bimalleolar fracture of right lower leg, initial encounter for closed fracture: Secondary | ICD-10-CM | POA: Diagnosis not present

## 2021-01-26 DIAGNOSIS — S89301A Unspecified physeal fracture of lower end of right fibula, initial encounter for closed fracture: Secondary | ICD-10-CM | POA: Diagnosis not present

## 2021-01-26 MED ORDER — METHOCARBAMOL 500 MG PO TABS
500.0000 mg | ORAL_TABLET | Freq: Four times a day (QID) | ORAL | 0 refills | Status: DC
  Filled 2021-01-26: qty 30, 8d supply, fill #0

## 2021-01-26 MED ORDER — METHYLPREDNISOLONE 4 MG PO TBPK
ORAL_TABLET | ORAL | 0 refills | Status: DC
  Filled 2021-01-26: qty 21, 6d supply, fill #0

## 2021-01-26 NOTE — Progress Notes (Signed)
Chief Complaint: right ankle fracture     History of Present Illness:   01/26/2021: Presents today overall in poor spirits.  She is having increased back pain and hip pain on the left side as result of not being able to place weight on the right side.  She has been compliant with casting and nonweightbearing on the right.  She is recently entered hospice care as result of all of this pain.  Cindy [name] is a 75 y.o. female with right ankle pain after a fall while cleaning up her dogs past.  This occurred on 12/24/2020.  Of note she did put weight on the ankle following that and subsequently presented to the emergency room.  She currently lives at home alone with help of friends who come to her house.  Denies any history of diabetes on insulin.  She does state that she was previously told she is prediabetic.  She is currently battling lung cancer.  She is on chemotherapy for this.    Surgical History:   None  PMH/PSH/Family History/Social History/Meds/Allergies:    Past Medical History:  Diagnosis Date   Anxiety    Depression    Hyperlipidemia    Hypertension    Past Surgical History:  Procedure Laterality Date   DENTAL SURGERY  2012 - 2013   implants   IR IMAGING GUIDED PORT INSERTION  08/27/2020   IR THORACENTESIS ASP PLEURAL SPACE W/IMG GUIDE  12/19/2019   WISDOM TOOTH EXTRACTION  20   Social History   Socioeconomic History   Marital status: Widowed    Spouse name: Not on file   Number of children: 0   Years of education: Not on file   Highest education level: Not on file  Occupational History    Employer: [employer]  Tobacco Use   Smoking status: Former    Packs/day: 2.00    Years: 15.00    Pack years: 30.00    Types: Cigarettes    Quit date: 1985    Years since quitting: 37.9   Smokeless tobacco: Never  Vaping Use   Vaping Use: Never used  Substance and Sexual Activity   Alcohol use: Yes    Alcohol/week: 10.0  standard drinks    Types: 10 Standard drinks or equivalent per week   Drug use: No   Sexual activity: Never    Birth control/protection: Abstinence, Post-menopausal  Other Topics Concern   Not on file  Social History Narrative   Not on file   Social Determinants of Health   Financial Resource Strain: Not on file  Food Insecurity: Not on file  Transportation Needs: Not on file  Physical Activity: Not on file  Stress: Not on file  Social Connections: Not on file   Family History  Problem Relation Age of Onset   Dementia Mother    COPD Mother    Lung cancer Maternal Grandmother    Cirrhosis Brother    Allergies  Allergen Reactions   Escitalopram      sweating, headaches Other reaction(s): sweating, headaches Other reaction(s): sweating, headaches   Povidone Iodine Itching    itching   Procaine Palpitations   Current Outpatient Medications  Medication Sig Dispense Refill   methocarbamol (ROBAXIN) 500 MG tablet Take 1 tablet (500 mg total) by mouth 4 (four) times daily. 30 tablet  0   methylPREDNISolone (MEDROL DOSEPAK) 4 MG TBPK tablet Take per packet instructions 1 each 0   ALPRAZolam (XANAX) 0.25 MG tablet Take 1 tablet by mouth once a day as needed 30 tablet 0   Ascorbic Acid (VITAMIN C PO) Take 1 tablet by mouth daily.     aspirin 81 MG tablet Take 81 mg by mouth daily.     b complex vitamins capsule Take 1 capsule by mouth 2 (two) times a week.     HYDROcodone bit-homatropine (HYCODAN) 5-1.5 MG/5ML syrup Take 5 mLs by mouth every 6 hours as needed for cough. 120 mL 0   levothyroxine (SYNTHROID) 75 MCG tablet Take 1 tablet by mouth on an empty stomach once daily in the morning 30 tablet 12   lidocaine-prilocaine (EMLA) cream Apply to the Port-A-Cath site 30-60-minute before chemotherapy. 30 g 0   meloxicam (MOBIC) 15 MG tablet Take 1 tablet (15 mg total) by mouth daily. 30 tablet 2   metoprolol succinate (TOPROL-XL) 50 MG 24 hr tablet Take 1 tablet (50 mg total) by  mouth daily. 90 tablet 3   Multiple Minerals-Vitamins (CALCIUM-MAGNESIUM-ZINC-D3 PO) Take 1 tablet by mouth 2 (two) times a week.     Multiple Vitamin (MULTIVITAMIN) tablet Take 1 tablet by mouth 3 (three) times a week.     neomycin-polymyxin b-dexamethasone (MAXITROL) 3.5-10000-0.1 SUSP Place 1 drop into the both eyes 4 times a day 5 mL 0   oxyCODONE (ROXICODONE) 5 MG immediate release tablet Take 1 tablet (5 mg total) by mouth every 6 (six) hours as needed for severe pain. 40 tablet 0   Oxycodone HCl 10 MG TABS Take 1 tablet by mouth every 4 hours as needed for pain 90 tablet 0   polyethylene glycol (MIRALAX / GLYCOLAX) 17 g packet Take 17 g by mouth daily.     Polyvinyl Alcohol-Povidone (REFRESH OP) Place 1 drop into both eyes daily as needed (dry eyes).     prochlorperazine (COMPAZINE) 10 MG tablet TAKE 1 TABLET(10 MG) BY MOUTH EVERY 6 HOURS AS NEEDED FOR NAUSEA OR VOMITING 30 tablet 0   senna-docusate (SENOKOT-S) 8.6-50 MG tablet Take 3-4 tablets by mouth 2 times a day 90 tablet 2   senna-docusate (SENOKOT-S) 8.6-50 MG tablet Take 4 tablets by mouth 2 times a day 100 tablet 3   sertraline (ZOLOFT) 50 MG tablet Take 1 tablet by mouth Once a day 30 tablet 12   sertraline (ZOLOFT) 50 MG tablet Take 1 tablet by mouth daily. 30 tablet 0   simvastatin (ZOCOR) 20 MG tablet Take 1 tablet (20 mg total) by mouth every evening. 90 tablet 1   sotorasib (LUMAKRAS) 120 MG TABS Take 8 tablets (960 mg) by mouth daily. 240 tablet 3   VITAMIN D PO Take 1 capsule by mouth 3 (three) times a week.     VITAMIN E PO Take 1 capsule by mouth 2 (two) times a week.     No current facility-administered medications for this visit.   No results found.  Review of Systems:   A ROS was performed including pertinent positives and negatives as documented in the HPI.  Physical Exam :   Constitutional: NAD and appears stated age Neurological: Alert and oriented Psych: Appropriate affect and cooperative Last menstrual  period 09/29/2002.   Comprehensive Musculoskeletal Exam:    There is trace swelling about the ankle upon cast removal.  Sensation is intact observation of the right foot.  2+ dorsalis pedis pulse she is able to fire  EHL tib ant as well as gastrocsoleus  Imaging:   Xray (3 views right ankle, 2 views tib-fib): Bimalleolar ankle fracture with minimal displacement.  There is interval callus formation   I personally reviewed and interpreted the radiographs.   Assessment:   75 year old female with right bimalleolar ankle fracture after a fall.  At this time she has enough callus formation that I have switched her to a cam boot on the right side and she may be weightbearing progressively as tolerated.   Plan :    -Return to clinic in 4 weeks    I personally saw and evaluated the patient, and participated in the management and treatment plan.  Vanetta Mulders, MD Attending Physician, Orthopedic Surgery  This document was dictated using Dragon voice recognition software. A reasonable attempt at proof reading has been made to minimize errors.

## 2021-02-03 ENCOUNTER — Other Ambulatory Visit (HOSPITAL_COMMUNITY): Payer: Self-pay

## 2021-02-03 MED ORDER — FLUCONAZOLE 100 MG PO TABS
100.0000 mg | ORAL_TABLET | ORAL | 0 refills | Status: DC
  Filled 2021-02-03: qty 7, 7d supply, fill #0

## 2021-02-04 ENCOUNTER — Encounter: Payer: Self-pay | Admitting: Physician Assistant

## 2021-02-04 ENCOUNTER — Other Ambulatory Visit (HOSPITAL_COMMUNITY): Payer: Self-pay

## 2021-02-04 ENCOUNTER — Other Ambulatory Visit (HOSPITAL_BASED_OUTPATIENT_CLINIC_OR_DEPARTMENT_OTHER): Payer: Self-pay

## 2021-02-04 MED ORDER — METHOCARBAMOL 500 MG PO TABS
500.0000 mg | ORAL_TABLET | Freq: Four times a day (QID) | ORAL | 3 refills | Status: DC
  Filled 2021-02-04: qty 120, 30d supply, fill #0

## 2021-02-05 ENCOUNTER — Other Ambulatory Visit (HOSPITAL_COMMUNITY): Payer: Self-pay

## 2021-02-05 ENCOUNTER — Ambulatory Visit (HOSPITAL_BASED_OUTPATIENT_CLINIC_OR_DEPARTMENT_OTHER): Payer: Medicare PPO | Admitting: Orthopaedic Surgery

## 2021-02-08 ENCOUNTER — Other Ambulatory Visit (HOSPITAL_COMMUNITY): Payer: Self-pay

## 2021-02-09 ENCOUNTER — Other Ambulatory Visit (HOSPITAL_COMMUNITY): Payer: Self-pay

## 2021-02-09 MED ORDER — OXYCODONE HCL 10 MG PO TABS
ORAL_TABLET | ORAL | 0 refills | Status: DC
  Filled 2021-02-09: qty 90, 15d supply, fill #0

## 2021-02-10 ENCOUNTER — Other Ambulatory Visit (HOSPITAL_COMMUNITY): Payer: Self-pay

## 2021-02-15 ENCOUNTER — Other Ambulatory Visit (HOSPITAL_COMMUNITY): Payer: Self-pay

## 2021-02-15 MED ORDER — PREDNISONE 10 MG PO TABS
10.0000 mg | ORAL_TABLET | Freq: Every day | ORAL | 3 refills | Status: DC
  Filled 2021-02-15: qty 30, 30d supply, fill #0

## 2021-02-22 ENCOUNTER — Other Ambulatory Visit (HOSPITAL_BASED_OUTPATIENT_CLINIC_OR_DEPARTMENT_OTHER): Payer: Self-pay | Admitting: Orthopaedic Surgery

## 2021-02-22 DIAGNOSIS — S82841G Displaced bimalleolar fracture of right lower leg, subsequent encounter for closed fracture with delayed healing: Secondary | ICD-10-CM

## 2021-02-23 ENCOUNTER — Ambulatory Visit (HOSPITAL_BASED_OUTPATIENT_CLINIC_OR_DEPARTMENT_OTHER): Payer: Medicare PPO | Admitting: Orthopaedic Surgery

## 2021-02-23 ENCOUNTER — Other Ambulatory Visit (HOSPITAL_COMMUNITY): Payer: Self-pay

## 2021-02-23 MED ORDER — MORPHINE SULFATE (CONCENTRATE) 10 MG /0.5 ML PO SOLN
ORAL | 0 refills | Status: DC
  Filled 2021-02-23: qty 30, 10d supply, fill #0

## 2021-02-23 MED ORDER — ALPRAZOLAM 0.25 MG PO TABS
ORAL_TABLET | ORAL | 3 refills | Status: DC
  Filled 2021-02-23: qty 30, 5d supply, fill #0

## 2021-02-24 ENCOUNTER — Other Ambulatory Visit (HOSPITAL_COMMUNITY): Payer: Self-pay

## 2021-02-24 MED ORDER — FENTANYL 25 MCG/HR TD PT72
MEDICATED_PATCH | TRANSDERMAL | 0 refills | Status: DC
  Filled 2021-02-24: qty 5, 15d supply, fill #0

## 2021-02-24 MED ORDER — SORBITOL 70 % SOLN
3 refills | Status: DC
  Filled 2021-02-24: qty 473, 8d supply, fill #0

## 2021-02-28 DEATH — deceased

## 2022-03-07 IMAGING — CT CT CHEST W/ CM
2 of 3 series · 15 of 36 positions shown, 18 images · IV contrast (omnipaque)
Comparison: None.

CLINICAL DATA: Dyspnea

EXAM:
CT CHEST WITH CONTRAST
TECHNIQUE: Multidetector CT imaging of the chest was performed during
intravenous contrast administration.
CONTRAST:  75mL OMNIPAQUE IOHEXOL 300 MG/ML  SOLN

[Series 2: axial st · axial · 0.77mm/px · z∈[-311,-57]mm · 12 of 149 slices shown, 15 images]
[im 11/149  mediastinal]
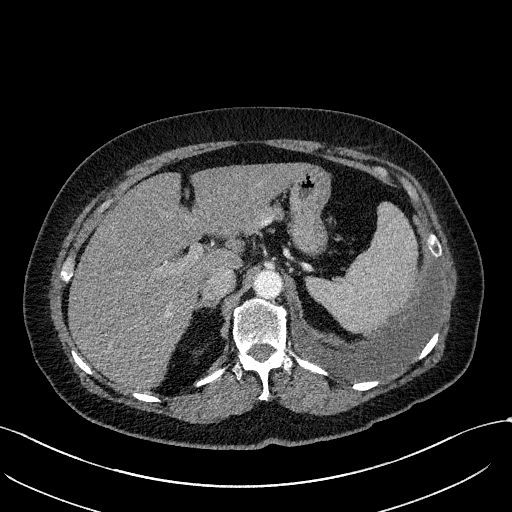
[im 11/149  lung]
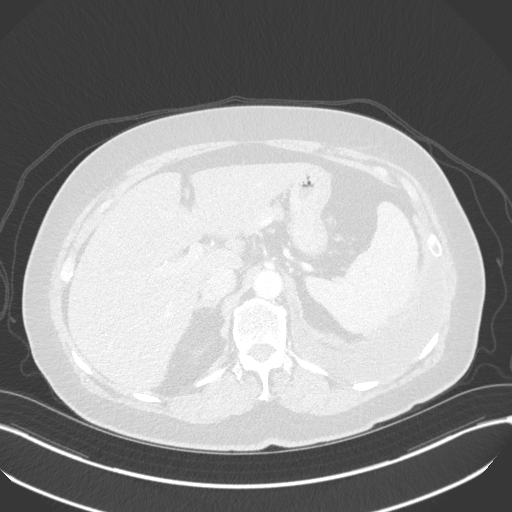
[im 22/149  lung]
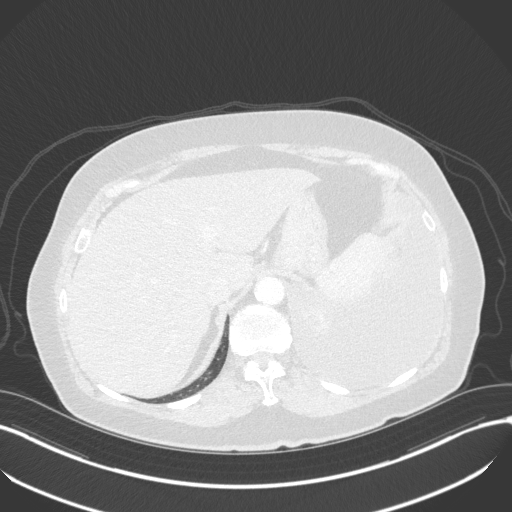
[im 33/149  lung]
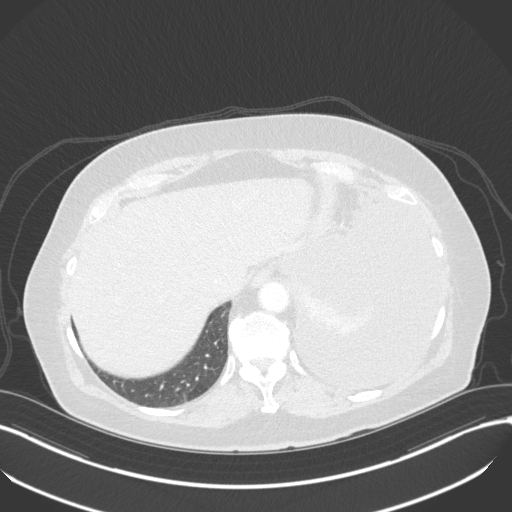
[im 44/149  lung]
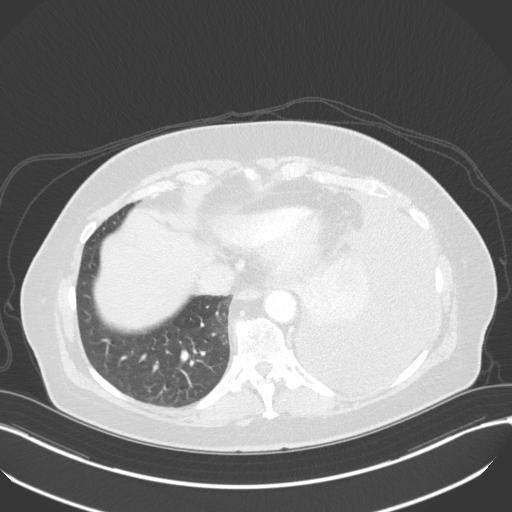
[im 55/149  mediastinal]
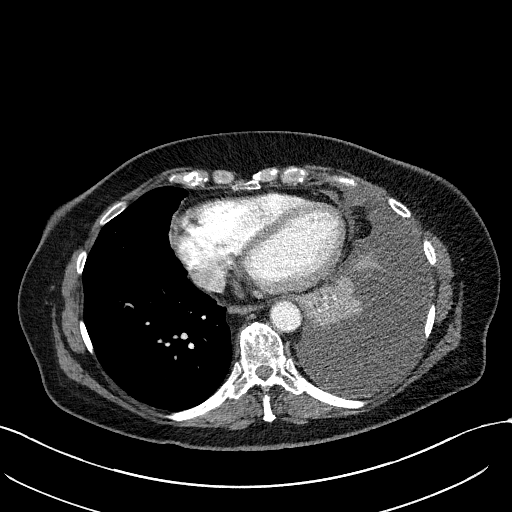
[im 55/149  lung]
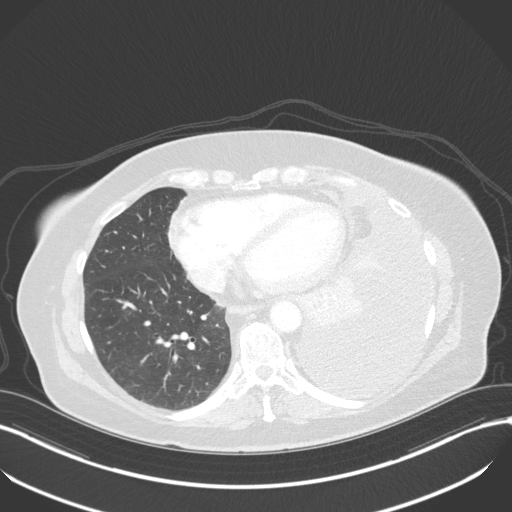
[im 66/149  lung]
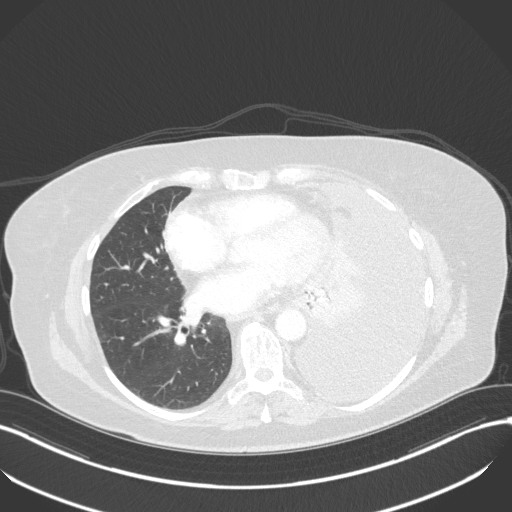
[im 83/149  lung]
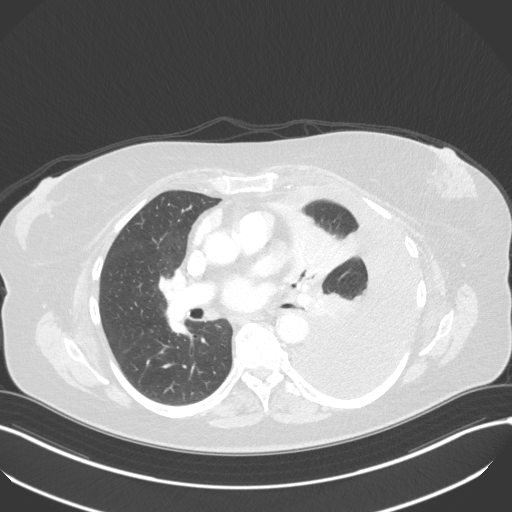
[im 94/149  lung]
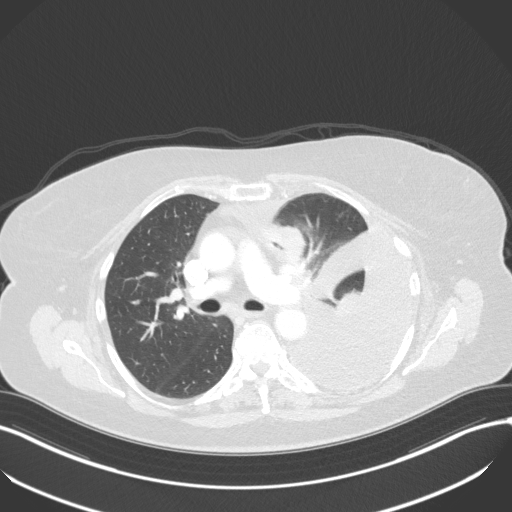
[im 105/149  mediastinal]
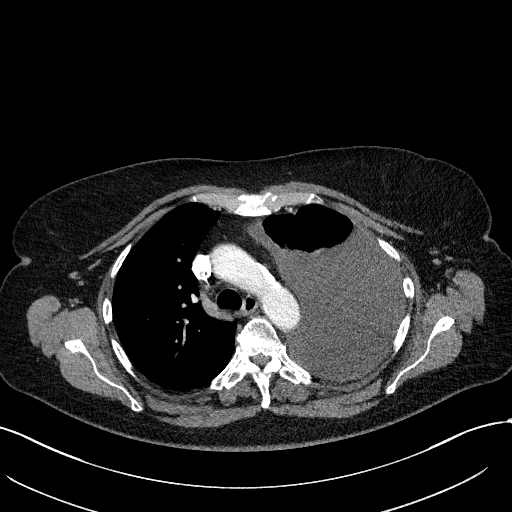
[im 105/149  lung]
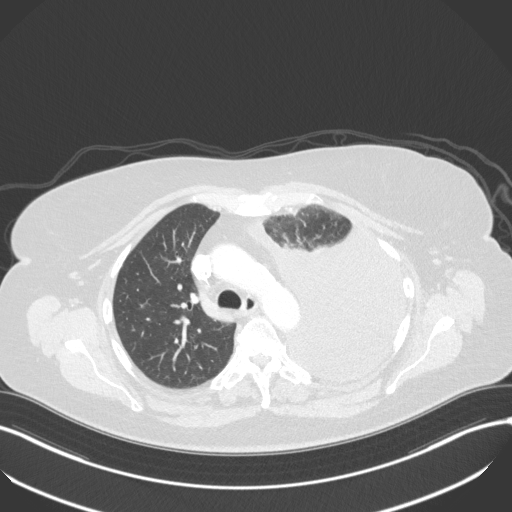
[im 116/149  lung]
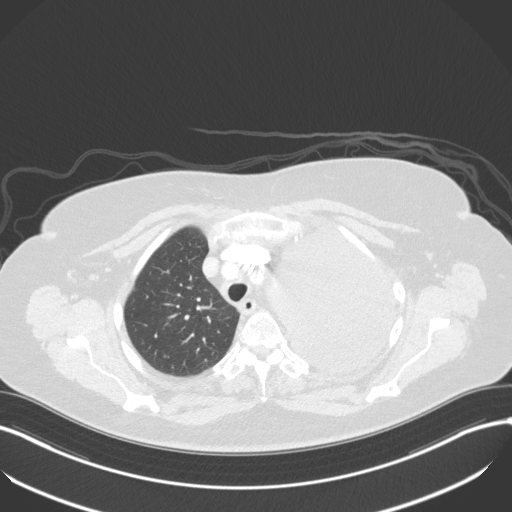
[im 127/149  lung]
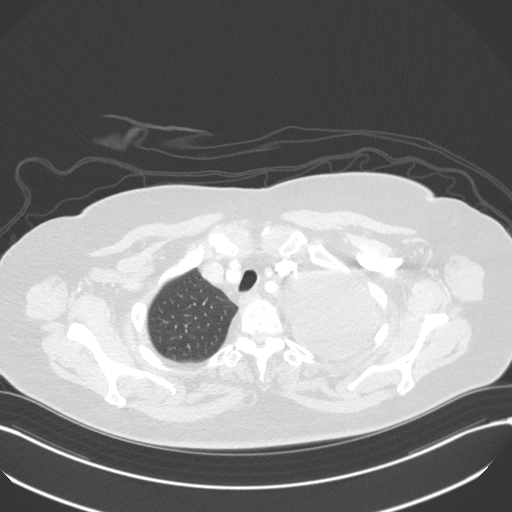
[im 138/149  lung]
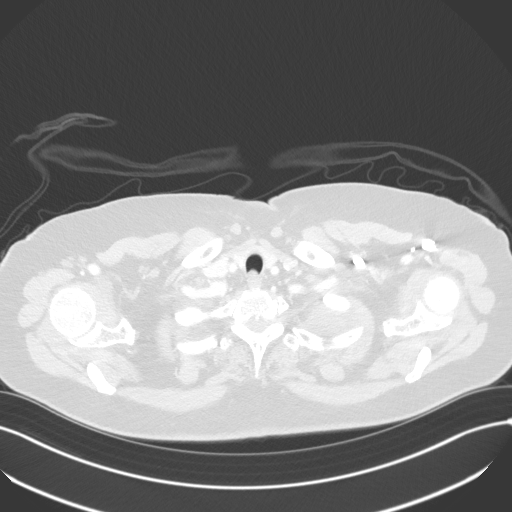

[Series 5: coronal · coronal · 0.61mm/px · 3 of 116 slices shown]
[im 24/116  lung]
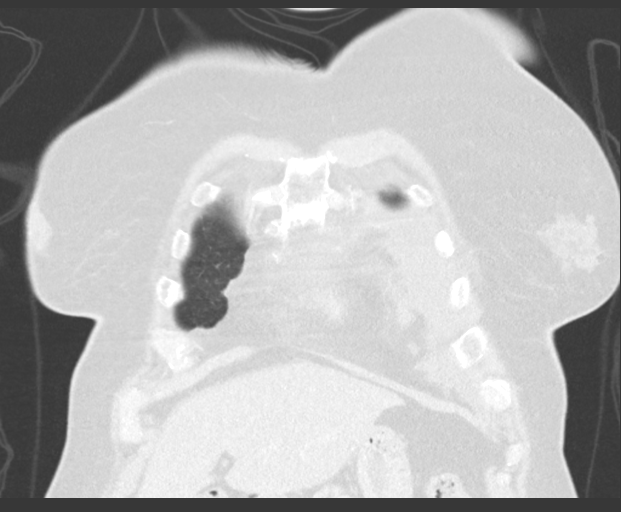
[im 47/116  lung]
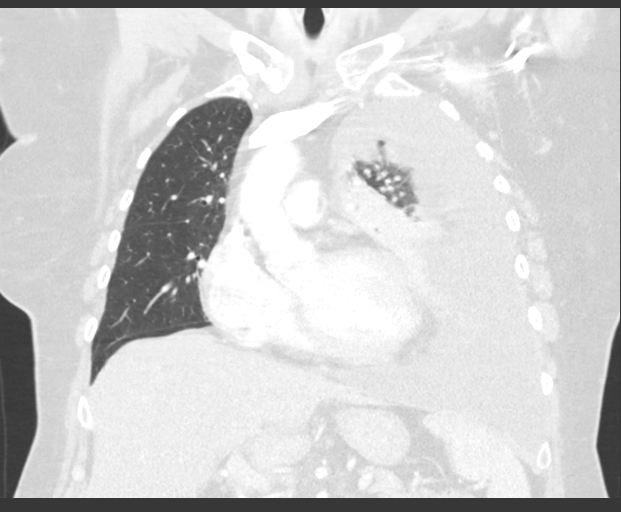
[im 70/116  lung]
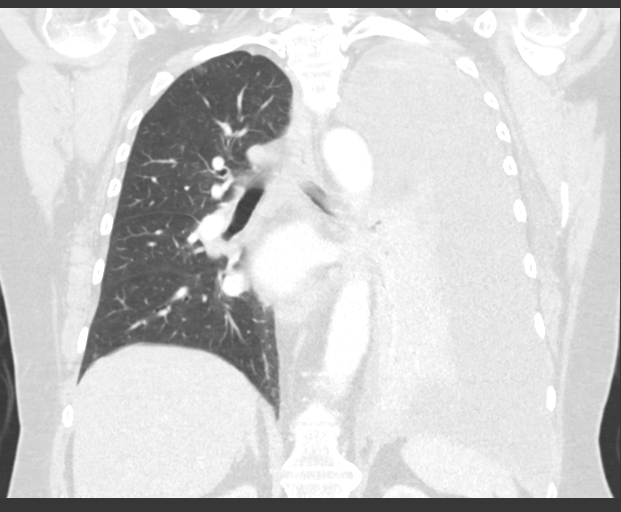

[15 of 36 positions shown; findings below may reference images not displayed]

FINDINGS: Cardiovascular: Mild coronary artery calcification. Global cardiac
size within normal limits. No pericardial effusion. Central
pulmonary arteries are of normal caliber. Moderate atherosclerotic
calcification within the aortic arch and descending thoracic aorta.
The thoracic aorta is of normal caliber.

Mediastinum/Nodes: No enlarged mediastinal, hilar, or axillary lymph
nodes. Thyroid gland, trachea, and esophagus demonstrate no
significant findings.

Lungs/Pleura: Large left pleural effusion has enlarged since prior
chest radiograph 12/12/2019, and now nearly completely collapse of
the left lung. No significant pleural enhancement or pleural
nodularity identified. 5 mm noncalcified pulmonary nodule noted with
right upper lobe, axial image # 69, indeterminate. 4 mm subpleural
pulmonary nodule, right apex, axial image # 19, indeterminate. Right
lung is otherwise clear. No pneumothorax. No pleural effusion on
the right. The central airways are widely patent.

Upper Abdomen: Moderate hepatic steatosis.

Musculoskeletal: No lytic or blastic bone lesions. No acute bone
abnormality.
IMPRESSION: Progressive enlargement of a left pleural effusion, now large in
resulting in near complete collapse of the left lung. Diagnostic and
therapeutic thoracentesis may be helpful for further management.

Indeterminate pulmonary nodules within the right lung measuring up
to 5 mm. No follow-up needed if patient is low-risk (and has no
known or suspected primary neoplasm). Non-contrast chest CT can be
considered in 12 months if patient is high-risk. This recommendation
follows the consensus statement: Guidelines for Management of
Incidental Pulmonary Nodules Detected on CT Images: From the

Moderate hepatic steatosis.

Aortic Atherosclerosis (TAB8T-NFW.W).

## 2022-03-11 ENCOUNTER — Other Ambulatory Visit (HOSPITAL_COMMUNITY): Payer: Self-pay

## 2022-03-13 IMAGING — DX DG CHEST 2V
2 series · 2 of 2 positions shown · non-contrast
Comparison: 12/19/2019

CLINICAL DATA: Follow-up pleural effusion

EXAM:
CHEST - 2 VIEW

[dg chest 2 view (1 of 2)]
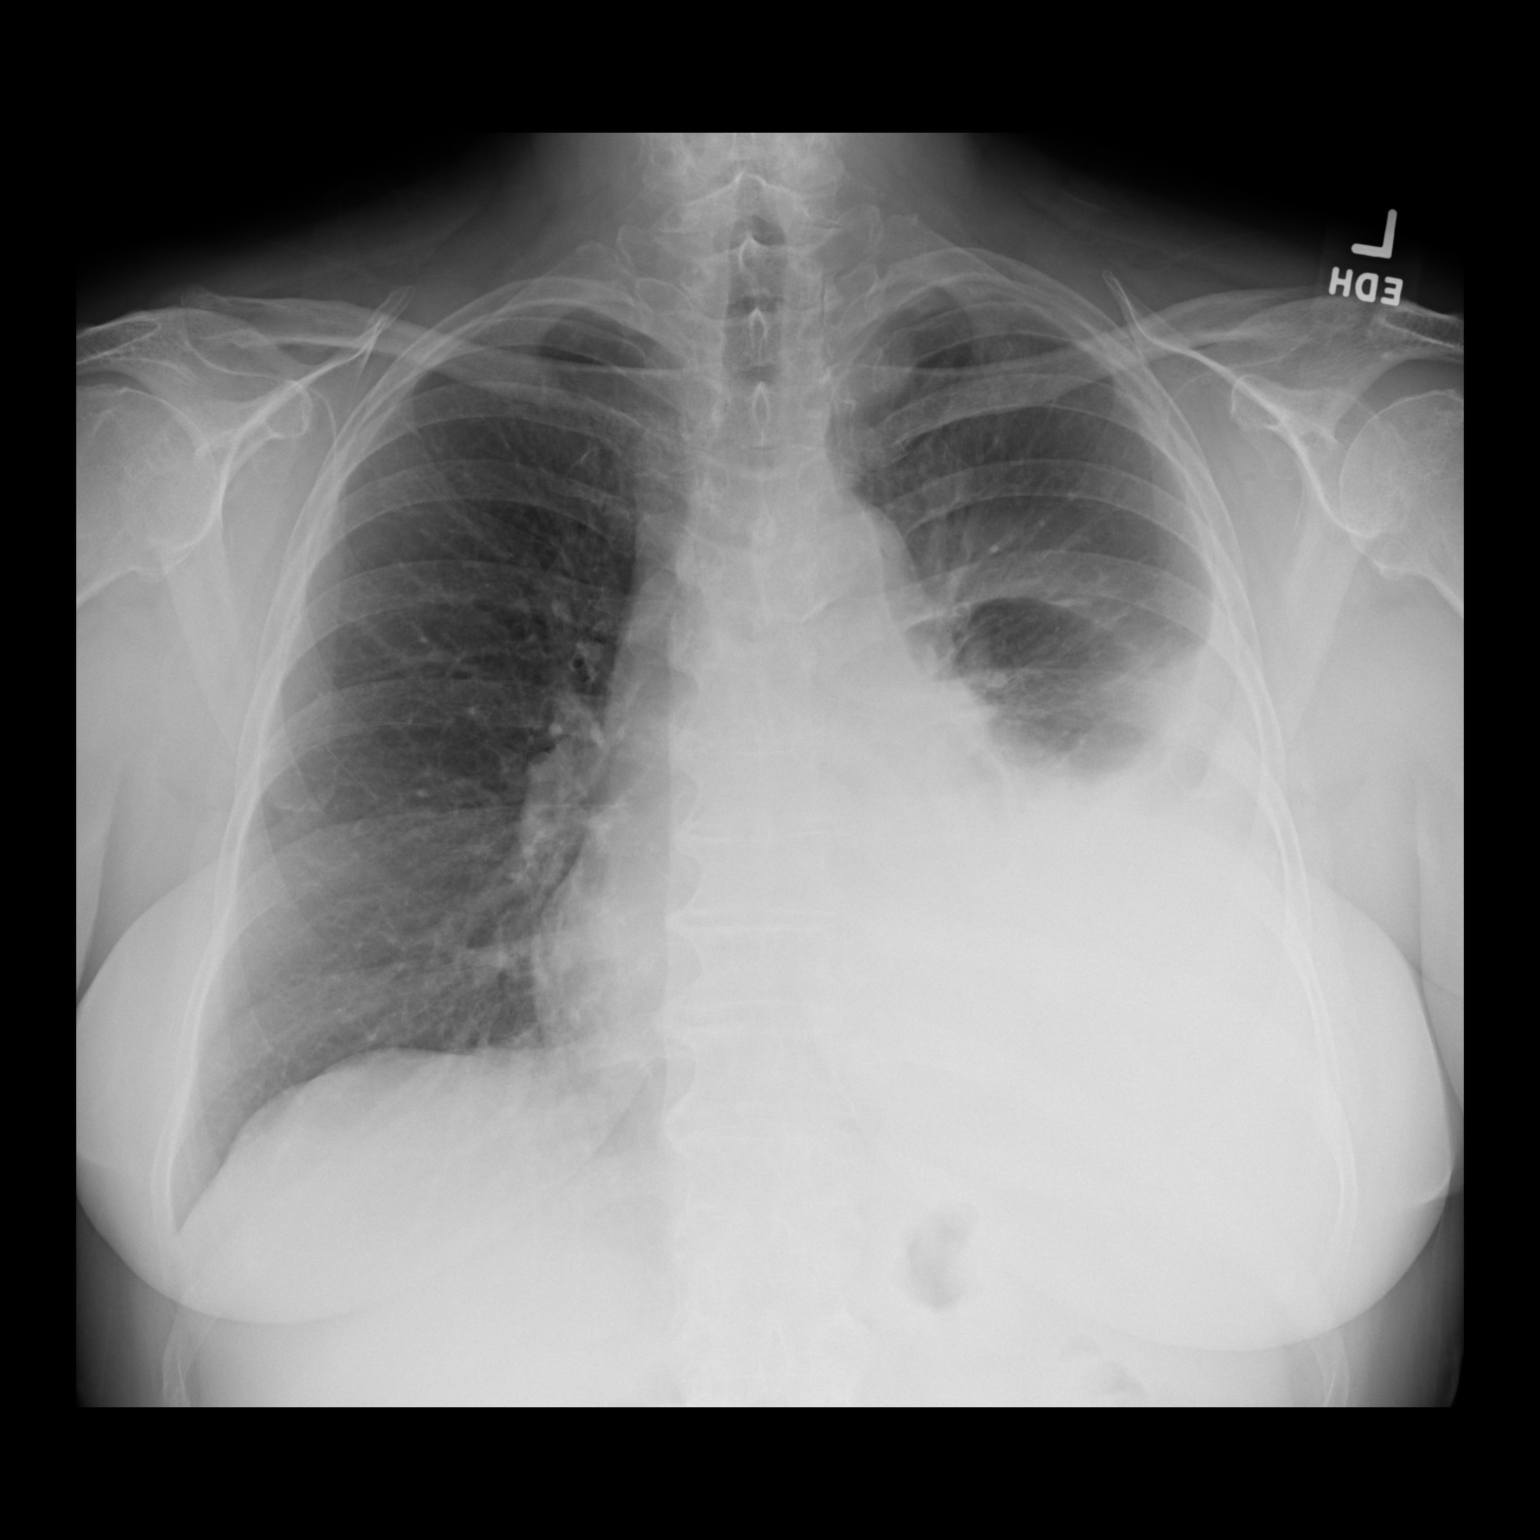

[dg chest 2 view (2 of 2)]
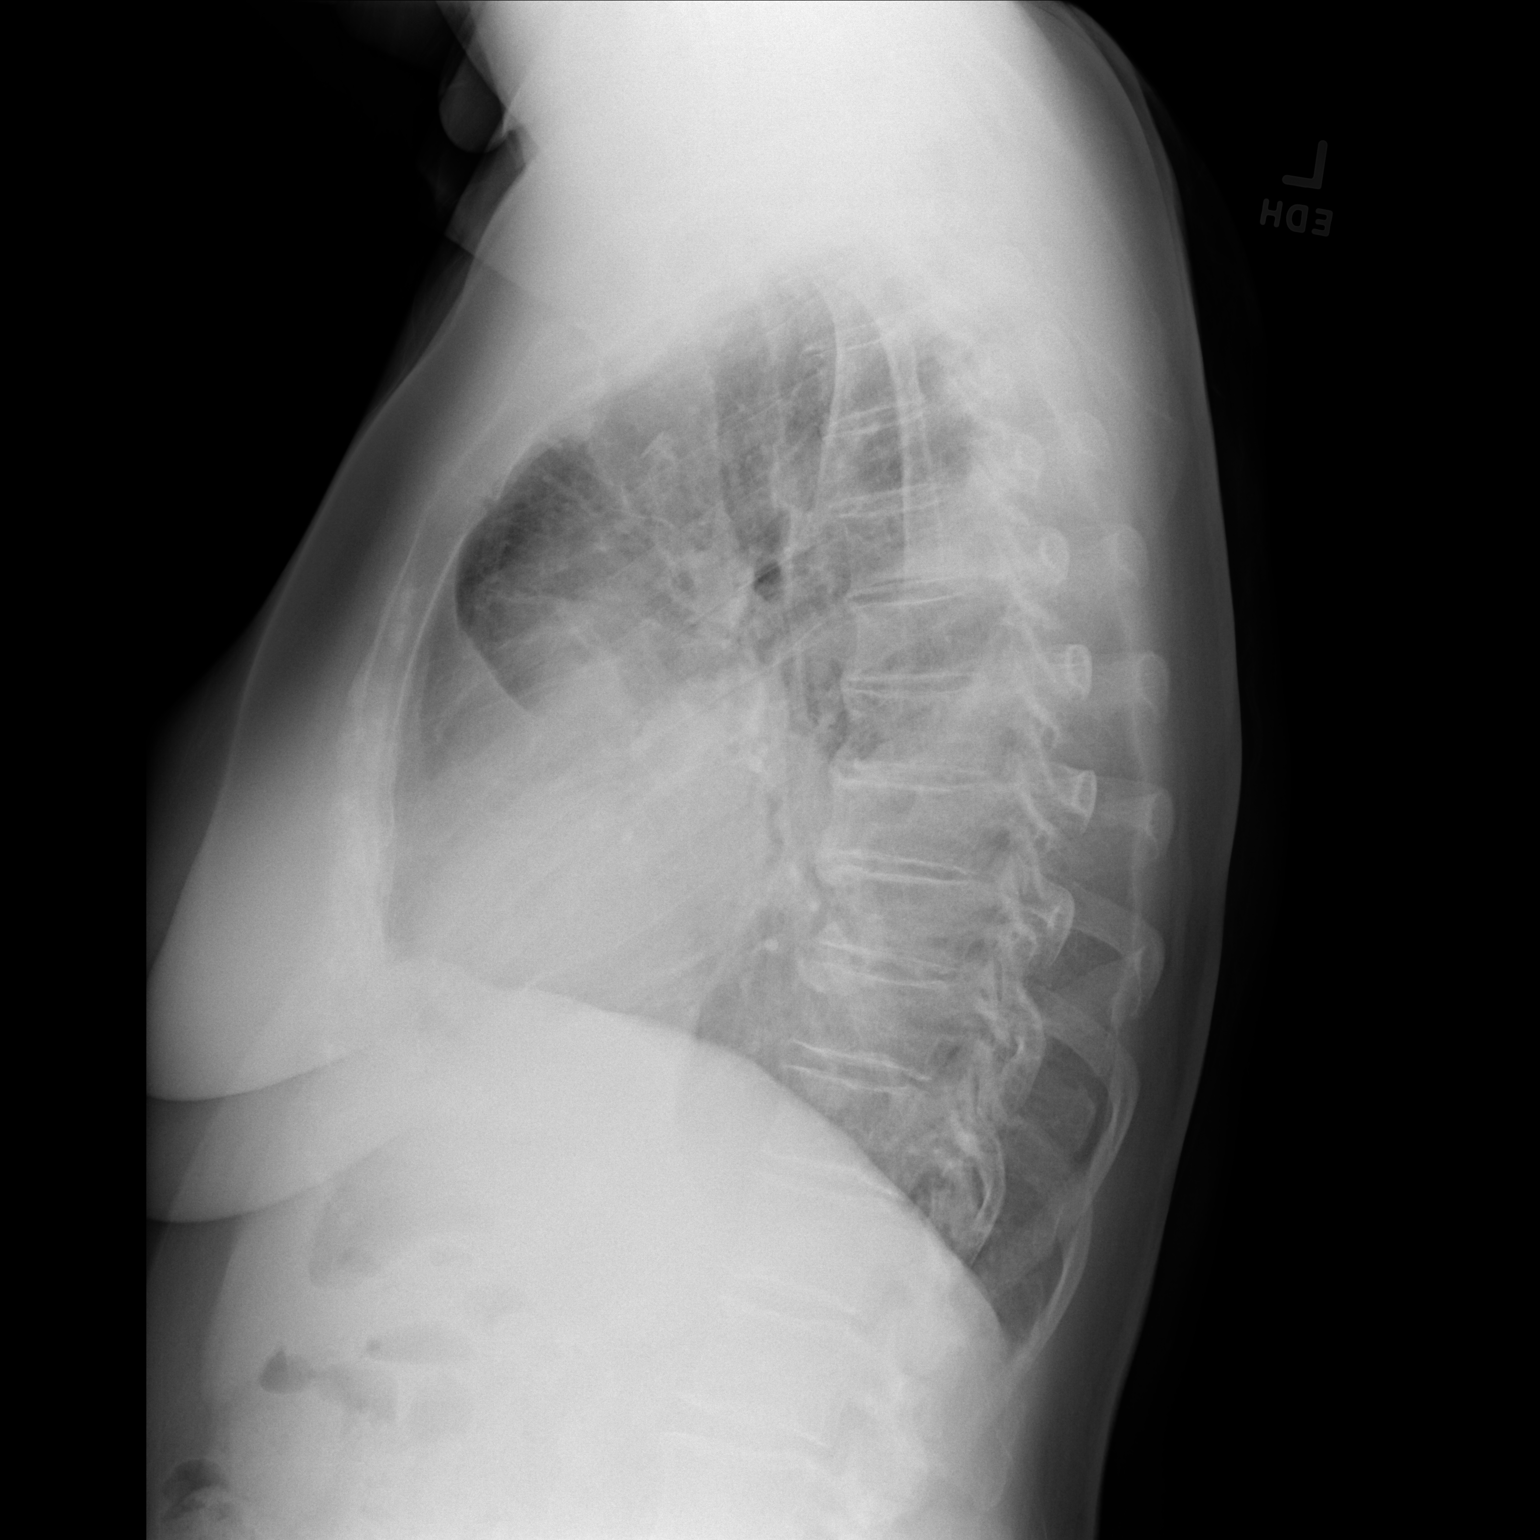

[2 of 2 positions shown; findings below may reference images not displayed]

FINDINGS: Moderately large left pleural effusion is slightly larger. Increase
in left lower lobe atelectasis.

Right lung remains clear.  Negative for heart failure or edema.
IMPRESSION: Moderate left effusion shows mild interval enlargement. Progressive
left lower lobe atelectasis.

## 2022-05-07 IMAGING — DX DG CHEST 2V
2 series · 2 of 2 positions shown · non-contrast
Comparison: 01/02/2020 and prior.

CLINICAL DATA: Shortness of breath

EXAM:
CHEST - 2 VIEW

[chest pa]
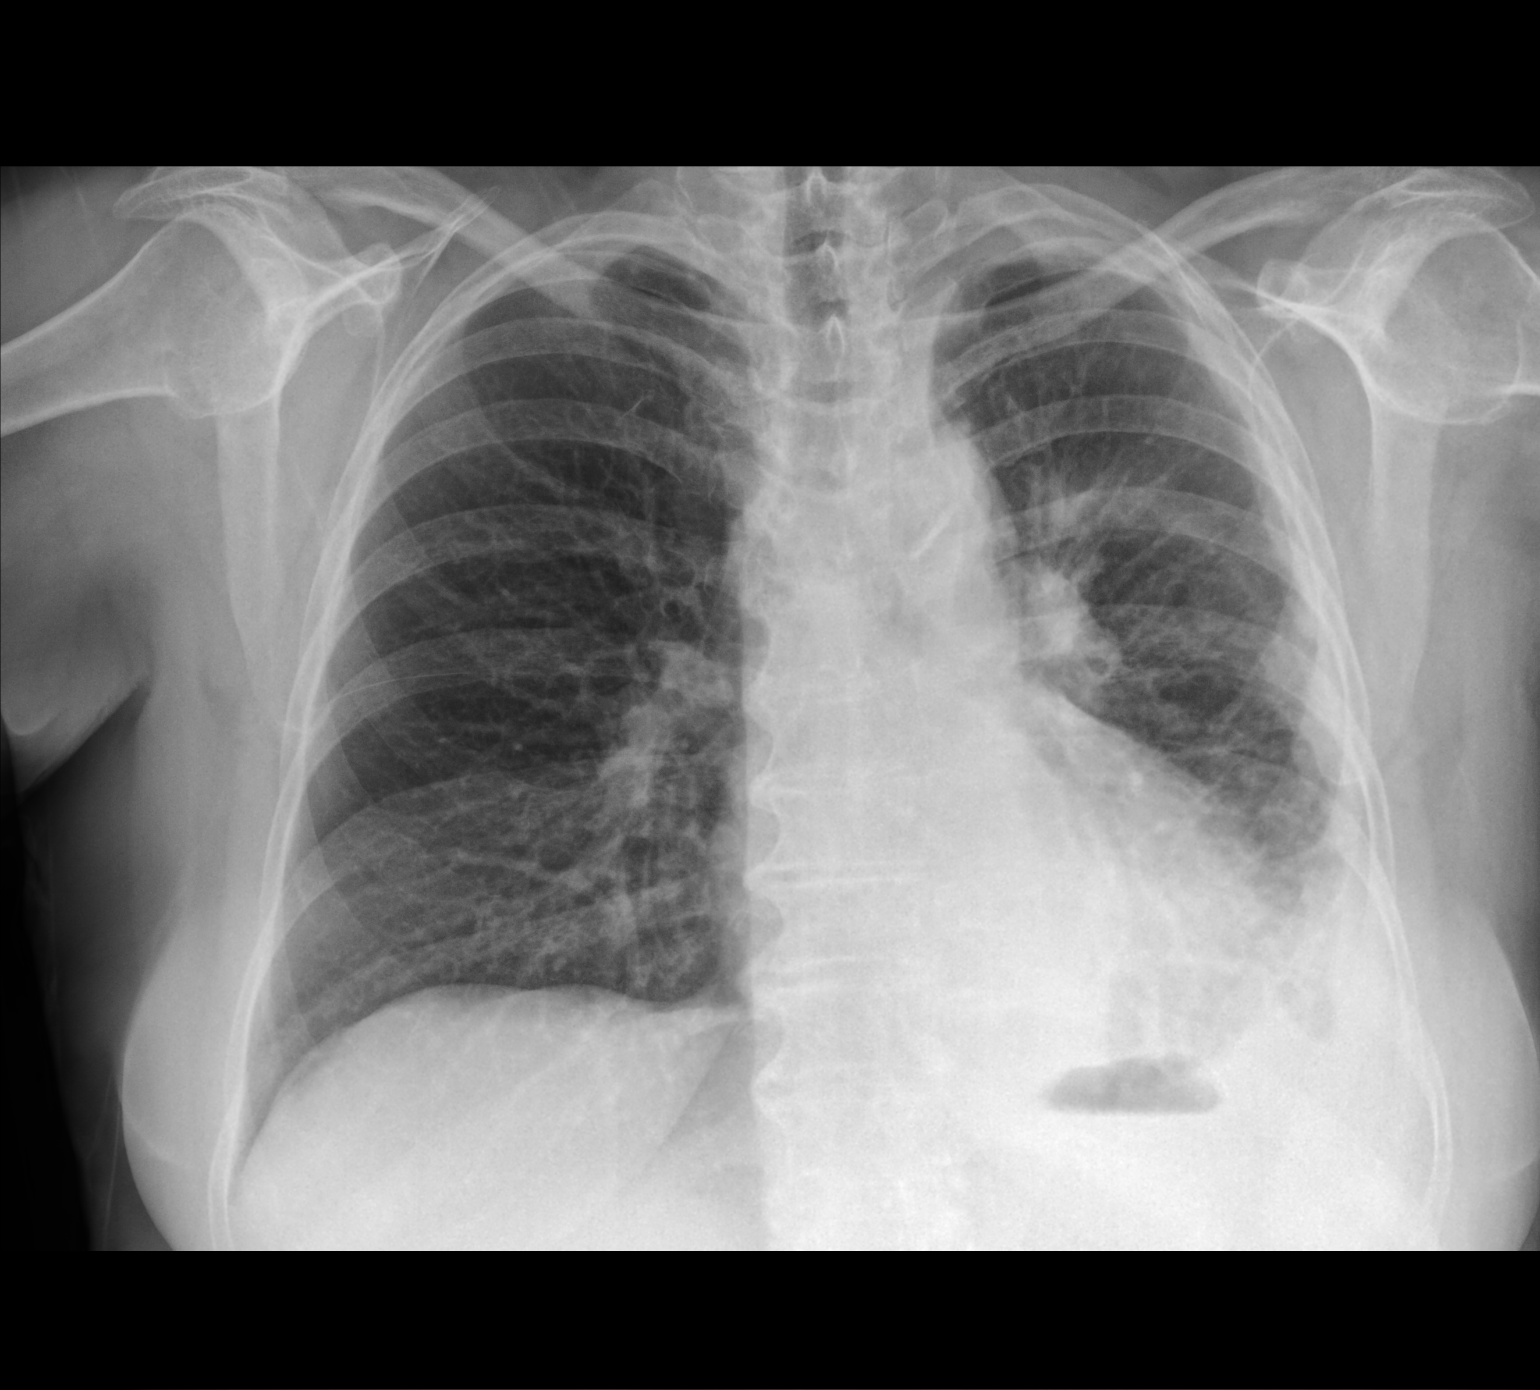

[chest lat]
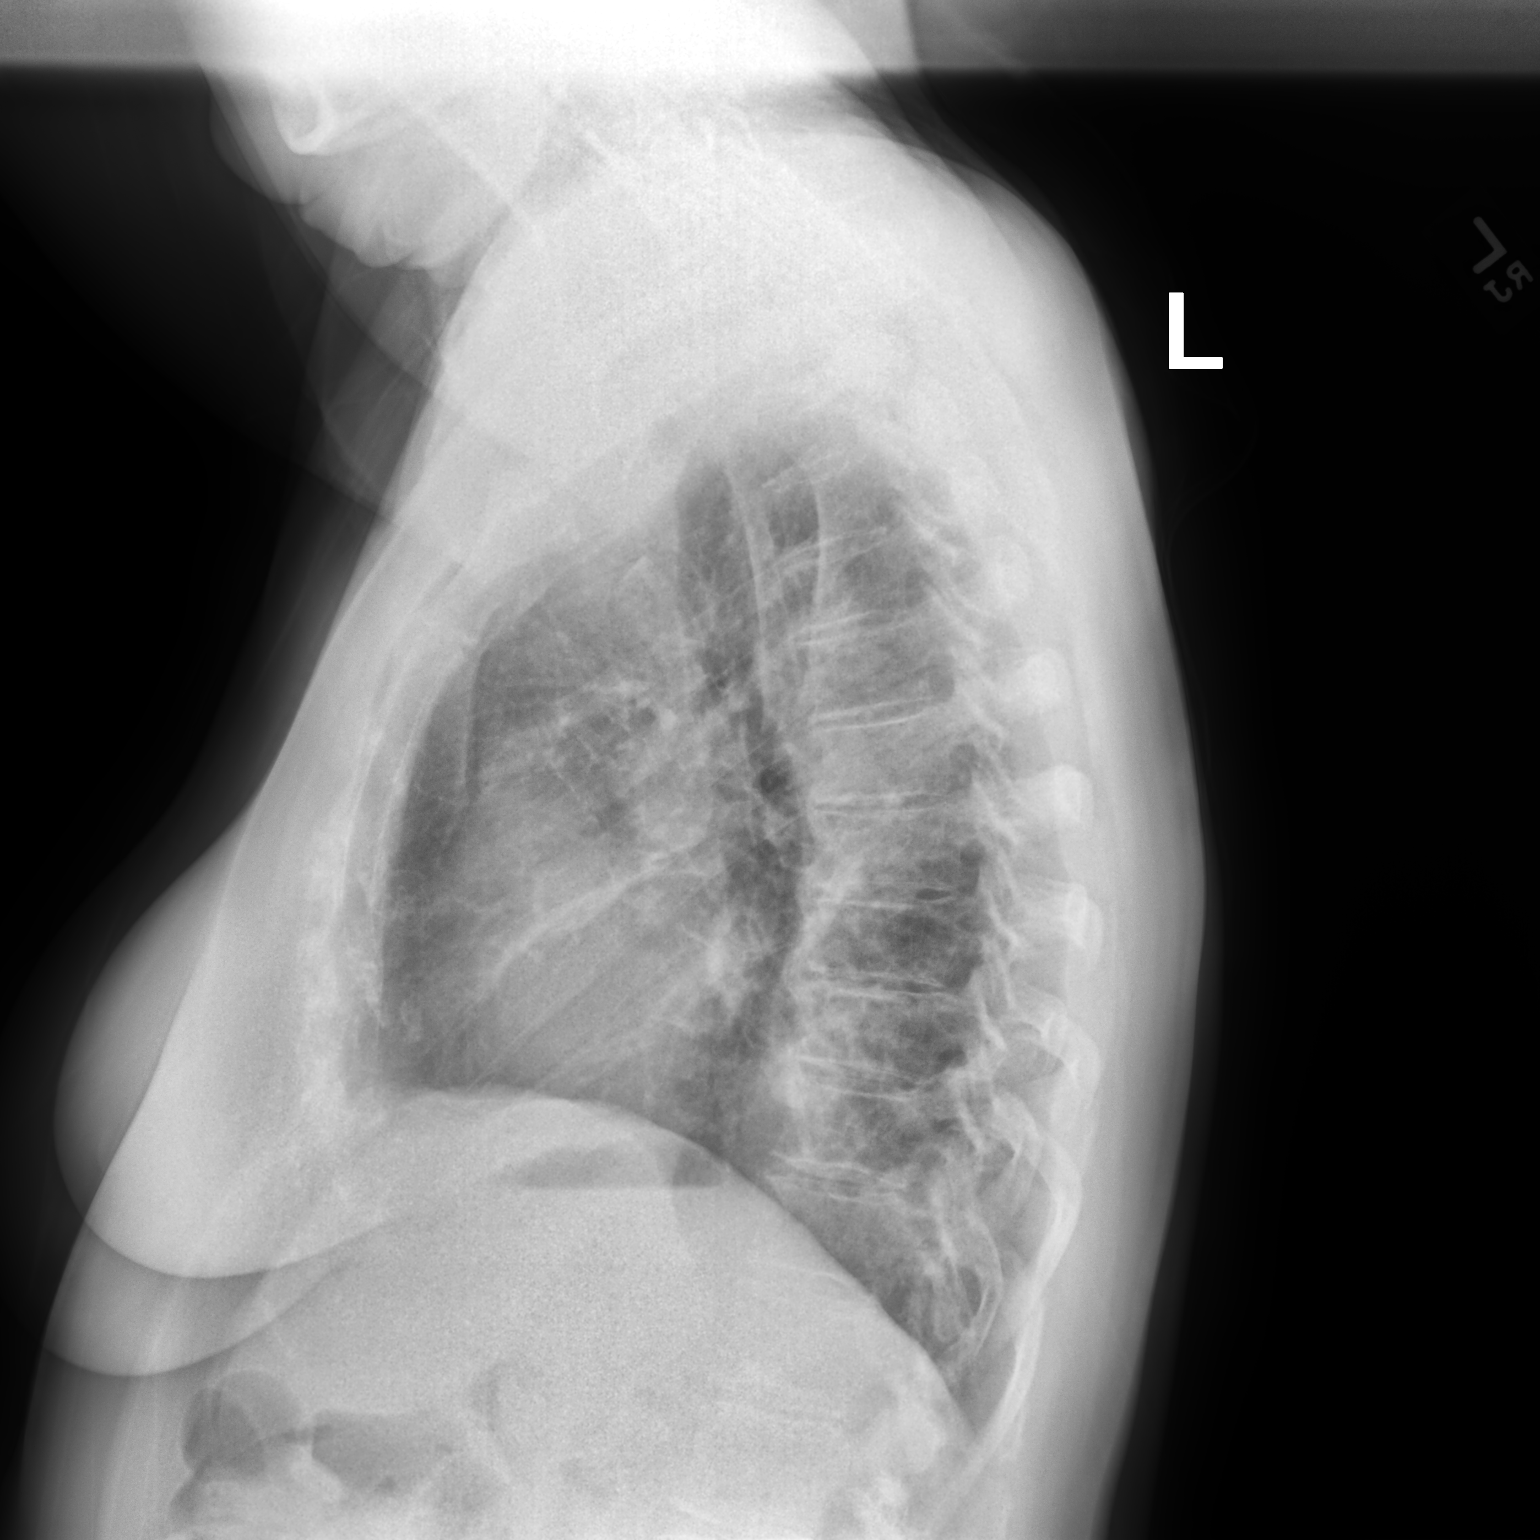

[2 of 2 positions shown; findings below may reference images not displayed]

FINDINGS: Increased conspicuity of left mid to lower lung opacities and small
left pleural effusion. No pneumothorax. Clear right lung. Stable
cardiomediastinal silhouette. Multilevel spondylosis.
IMPRESSION: Left lung opacities and small left effusion, increased since prior
exam.

## 2022-12-13 NOTE — Telephone Encounter (Signed)
TC
